# Patient Record
Sex: Female | Born: 1950 | Race: White | Hispanic: No | Marital: Single | Smoking: Never smoker
Health system: Southern US, Community
[De-identification: ages and names within clinical notes are randomized; demographics above are authoritative.]

## PROBLEM LIST (undated history)

## (undated) DIAGNOSIS — C55 Malignant neoplasm of uterus, part unspecified: Secondary | ICD-10-CM

## (undated) DIAGNOSIS — N289 Disorder of kidney and ureter, unspecified: Secondary | ICD-10-CM

## (undated) DIAGNOSIS — M1990 Unspecified osteoarthritis, unspecified site: Secondary | ICD-10-CM

## (undated) DIAGNOSIS — N183 Chronic kidney disease, stage 3 unspecified: Secondary | ICD-10-CM

## (undated) DIAGNOSIS — N200 Calculus of kidney: Secondary | ICD-10-CM

## (undated) DIAGNOSIS — I219 Acute myocardial infarction, unspecified: Secondary | ICD-10-CM

## (undated) DIAGNOSIS — G92 Toxic encephalopathy: Secondary | ICD-10-CM

## (undated) DIAGNOSIS — G928 Other toxic encephalopathy: Secondary | ICD-10-CM

## (undated) DIAGNOSIS — I10 Essential (primary) hypertension: Secondary | ICD-10-CM

## (undated) DIAGNOSIS — I469 Cardiac arrest, cause unspecified: Secondary | ICD-10-CM

## (undated) DIAGNOSIS — Z87442 Personal history of urinary calculi: Secondary | ICD-10-CM

## (undated) DIAGNOSIS — Z972 Presence of dental prosthetic device (complete) (partial): Secondary | ICD-10-CM

## (undated) DIAGNOSIS — Z9889 Other specified postprocedural states: Secondary | ICD-10-CM

## (undated) DIAGNOSIS — J449 Chronic obstructive pulmonary disease, unspecified: Secondary | ICD-10-CM

## (undated) HISTORY — DX: Essential (primary) hypertension: I10

## (undated) HISTORY — PX: CHOLECYSTECTOMY: SHX55

## (undated) HISTORY — PX: ABDOMINAL HYSTERECTOMY: SHX81

---

## 1998-06-30 ENCOUNTER — Other Ambulatory Visit: Admission: RE | Admit: 1998-06-30 | Discharge: 1998-06-30 | Payer: Self-pay | Admitting: *Deleted

## 1999-02-13 ENCOUNTER — Encounter: Payer: Self-pay | Admitting: General Surgery

## 1999-02-16 ENCOUNTER — Observation Stay (HOSPITAL_COMMUNITY): Admission: RE | Admit: 1999-02-16 | Discharge: 1999-02-18 | Payer: Self-pay | Admitting: General Surgery

## 1999-02-16 ENCOUNTER — Encounter: Payer: Self-pay | Admitting: General Surgery

## 2000-01-02 ENCOUNTER — Other Ambulatory Visit: Admission: RE | Admit: 2000-01-02 | Discharge: 2000-01-02 | Payer: Self-pay | Admitting: *Deleted

## 2000-01-11 ENCOUNTER — Encounter: Admission: RE | Admit: 2000-01-11 | Discharge: 2000-01-11 | Payer: Self-pay | Admitting: *Deleted

## 2000-01-11 ENCOUNTER — Encounter: Payer: Self-pay | Admitting: *Deleted

## 2011-10-26 ENCOUNTER — Encounter: Payer: Self-pay | Admitting: Emergency Medicine

## 2011-10-26 ENCOUNTER — Emergency Department (INDEPENDENT_AMBULATORY_CARE_PROVIDER_SITE_OTHER)
Admission: EM | Admit: 2011-10-26 | Discharge: 2011-10-26 | Disposition: A | Discharge status: Home / Self Care | Payer: Self-pay | Source: Home / Self Care | Attending: Family Medicine | Admitting: Family Medicine

## 2011-10-26 DIAGNOSIS — J45909 Unspecified asthma, uncomplicated: Secondary | ICD-10-CM

## 2011-10-26 DIAGNOSIS — J45901 Unspecified asthma with (acute) exacerbation: Secondary | ICD-10-CM

## 2011-10-26 MED ORDER — PREDNISONE 20 MG PO TABS: 60.0000 mg | ORAL_TABLET | Freq: Every day | ORAL | Status: AC

## 2011-10-26 MED ORDER — ALBUTEROL SULFATE (5 MG/ML) 0.5% IN NEBU
INHALATION_SOLUTION | RESPIRATORY_TRACT | Status: AC
  Filled 2011-10-26: qty 1

## 2011-10-26 MED ORDER — IPRATROPIUM BROMIDE 0.02 % IN SOLN
0.5000 mg | RESPIRATORY_TRACT | Status: DC
  Administered 2011-10-26 (×2): 0.5 mg via RESPIRATORY_TRACT

## 2011-10-26 MED ORDER — ALBUTEROL SULFATE (5 MG/ML) 0.5% IN NEBU
2.5000 mg | INHALATION_SOLUTION | Freq: Once | RESPIRATORY_TRACT | Status: AC
  Administered 2011-10-26: 2.5 mg via RESPIRATORY_TRACT

## 2011-10-26 MED ORDER — PREDNISONE 10 MG PO TABS
ORAL_TABLET | ORAL | Status: AC
  Filled 2011-10-26: qty 3

## 2011-10-26 MED ORDER — PREDNISONE 20 MG PO TABS
60.0000 mg | ORAL_TABLET | Freq: Once | ORAL | Status: AC
  Administered 2011-10-26: 60 mg via ORAL

## 2011-10-26 MED ORDER — ALBUTEROL SULFATE HFA 108 (90 BASE) MCG/ACT IN AERS: 2.0000 | INHALATION_SPRAY | RESPIRATORY_TRACT | Status: DC | PRN

## 2011-10-26 MED ORDER — ALBUTEROL SULFATE (5 MG/ML) 0.5% IN NEBU
5.0000 mg | INHALATION_SOLUTION | Freq: Once | RESPIRATORY_TRACT | Status: AC
  Administered 2011-10-26: 5 mg via RESPIRATORY_TRACT

## 2011-10-26 MED ORDER — IPRATROPIUM BROMIDE 0.02 % IN SOLN
0.5000 mg | Freq: Once | RESPIRATORY_TRACT | Status: AC
  Administered 2011-10-26: 0.5 mg via RESPIRATORY_TRACT

## 2011-10-26 NOTE — ED Notes (Signed)
PT HERE WITH ASTHMA ATTACK THAT STARTED OFF LAST WEEK WITH COUGH AND YELOW PHLEGM THEN PROGRESSED TO SOB,WHEEZING.PT USED INHALER BUT NOT WORKING.SATS 90% R/A ONCE BROUGHT BACK URGENT FROM WAITING ROOM THEN INCREASED TO 94-96% ONCE SETTLED.RESP 28,H/R 128.BREATHING TREATMENT STARTED

## 2011-10-26 NOTE — ED Provider Notes (Signed)
History     CSN: [id number]  Arrival date & time 10/26/11  1722   First MD Initiated Contact with Patient 10/26/11 1733      Chief Complaint  Patient presents with  . Asthma    (Consider location/radiation/quality/duration/timing/severity/associated sxs/prior treatment) HPI Comments: Emily Harrington presents for evaluation of persistent cough, shortness of breath, wheezing, over the last week. She and her daughter report a hx of asthma, normally without problem. She did keep her grandson last week, who had an upper respiratory infection. Her daughter reports that she has not had regular medical care since losing her insurance several years ago.   Patient is a 61 y.o. female presenting with asthma. The history is provided by the patient.  Asthma This is a recurrent problem. The current episode started more than 1 week ago. The problem occurs constantly. The problem has been gradually worsening. Associated symptoms include shortness of breath. The symptoms are aggravated by walking and exertion. The symptoms are relieved by nothing.    Past Medical History  Diagnosis Date  . Asthma     Past Surgical History  Procedure Date  . Cholecystectomy   . Abdominal hysterectomy     No family history on file.  History  Substance Use Topics  . Smoking status: Never Smoker   . Smokeless tobacco: Not on file  . Alcohol Use: No    OB History    Grav Para Term Preterm Abortions TAB SAB Ect Mult Living                  Review of Systems  Constitutional: Negative.   HENT: Negative.   Eyes: Negative.   Respiratory: Positive for cough, chest tightness, shortness of breath and wheezing.   Cardiovascular: Negative.   Gastrointestinal: Negative.   Genitourinary: Negative.   Musculoskeletal: Negative.   Skin: Negative.   Neurological: Negative.     Allergies  Review of patient's allergies indicates no known allergies.  Home Medications   Current Outpatient Rx  Name Route Sig Dispense  Refill  . ASPIRIN 81 MG PO TABS Oral Take 160 mg by mouth daily.      . MULTIVITAMINS PO CAPS Oral Take 1 capsule by mouth daily.      . ALBUTEROL SULFATE HFA 108 (90 BASE) MCG/ACT IN AERS Inhalation Inhale 2 puffs into the lungs every 4 (four) hours as needed for wheezing or shortness of breath. 1 Inhaler 0  . PREDNISONE 20 MG PO TABS Oral Take 3 tablets (60 mg total) by mouth daily. 21 tablet 0    BP 129/81  Pulse 125  Temp(Src) 98.5 F (36.9 C) (Oral)  Resp 28  SpO2 90%  Physical Exam  Nursing note and vitals reviewed. Constitutional: She is oriented to person, place, and time. She appears well-developed and well-nourished.  HENT:  Head: Normocephalic and atraumatic.  Eyes: EOM are normal.  Neck: Normal range of motion.  Cardiovascular: Regular rhythm.  Tachycardia present.   No murmur heard. Pulmonary/Chest: Effort normal. She has wheezes in the right upper field, the right middle field, the right lower field, the left upper field, the left middle field and the left lower field.  Musculoskeletal: Normal range of motion.  Neurological: She is alert and oriented to person, place, and time.  Skin: Skin is warm and dry.  Psychiatric: Her behavior is normal.    ED Course  Procedures (including critical care time)  Labs Reviewed - No data to display No results found.   1. Asthma attack  MDM  rx given for steroid burst, refilled albuterol; return precautions given        Richardo Priest, MD 10/26/11 1905

## 2012-01-08 ENCOUNTER — Emergency Department (HOSPITAL_COMMUNITY): Payer: Medicaid Other

## 2012-01-08 ENCOUNTER — Inpatient Hospital Stay (HOSPITAL_COMMUNITY): Payer: Medicaid Other

## 2012-01-08 ENCOUNTER — Inpatient Hospital Stay (HOSPITAL_COMMUNITY)
Admission: EM | Admit: 2012-01-08 | Discharge: 2012-02-14 | DRG: 004 | Disposition: A | Discharge status: Skilled Nursing Facility | Payer: Medicaid Other | Source: Ambulatory Visit | Attending: Pulmonary Disease | Admitting: Pulmonary Disease

## 2012-01-08 DIAGNOSIS — Z886 Allergy status to analgesic agent status: Secondary | ICD-10-CM

## 2012-01-08 DIAGNOSIS — E876 Hypokalemia: Secondary | ICD-10-CM | POA: Diagnosis not present

## 2012-01-08 DIAGNOSIS — Z930 Tracheostomy status: Secondary | ICD-10-CM

## 2012-01-08 DIAGNOSIS — M87059 Idiopathic aseptic necrosis of unspecified femur: Secondary | ICD-10-CM | POA: Diagnosis present

## 2012-01-08 DIAGNOSIS — R509 Fever, unspecified: Secondary | ICD-10-CM | POA: Diagnosis not present

## 2012-01-08 DIAGNOSIS — R0902 Hypoxemia: Secondary | ICD-10-CM | POA: Diagnosis present

## 2012-01-08 DIAGNOSIS — Z79899 Other long term (current) drug therapy: Secondary | ICD-10-CM

## 2012-01-08 DIAGNOSIS — S2249XA Multiple fractures of ribs, unspecified side, initial encounter for closed fracture: Secondary | ICD-10-CM

## 2012-01-08 DIAGNOSIS — R7881 Bacteremia: Secondary | ICD-10-CM

## 2012-01-08 DIAGNOSIS — Y849 Medical procedure, unspecified as the cause of abnormal reaction of the patient, or of later complication, without mention of misadventure at the time of the procedure: Secondary | ICD-10-CM | POA: Diagnosis present

## 2012-01-08 DIAGNOSIS — J9601 Acute respiratory failure with hypoxia: Secondary | ICD-10-CM

## 2012-01-08 DIAGNOSIS — Z7982 Long term (current) use of aspirin: Secondary | ICD-10-CM

## 2012-01-08 DIAGNOSIS — Z23 Encounter for immunization: Secondary | ICD-10-CM

## 2012-01-08 DIAGNOSIS — J155 Pneumonia due to Escherichia coli: Secondary | ICD-10-CM

## 2012-01-08 DIAGNOSIS — M87052 Idiopathic aseptic necrosis of left femur: Secondary | ICD-10-CM

## 2012-01-08 DIAGNOSIS — Y9210 Unspecified residential institution as the place of occurrence of the external cause: Secondary | ICD-10-CM | POA: Diagnosis present

## 2012-01-08 DIAGNOSIS — IMO0002 Reserved for concepts with insufficient information to code with codable children: Secondary | ICD-10-CM | POA: Diagnosis not present

## 2012-01-08 DIAGNOSIS — J45901 Unspecified asthma with (acute) exacerbation: Secondary | ICD-10-CM

## 2012-01-08 DIAGNOSIS — D638 Anemia in other chronic diseases classified elsewhere: Secondary | ICD-10-CM | POA: Diagnosis present

## 2012-01-08 DIAGNOSIS — Z87891 Personal history of nicotine dependence: Secondary | ICD-10-CM

## 2012-01-08 DIAGNOSIS — R000 Tachycardia, unspecified: Secondary | ICD-10-CM | POA: Diagnosis not present

## 2012-01-08 DIAGNOSIS — J9600 Acute respiratory failure, unspecified whether with hypoxia or hypercapnia: Secondary | ICD-10-CM

## 2012-01-08 DIAGNOSIS — I10 Essential (primary) hypertension: Secondary | ICD-10-CM

## 2012-01-08 DIAGNOSIS — A403 Sepsis due to Streptococcus pneumoniae: Principal | ICD-10-CM | POA: Diagnosis present

## 2012-01-08 DIAGNOSIS — A419 Sepsis, unspecified organism: Secondary | ICD-10-CM

## 2012-01-08 DIAGNOSIS — R1312 Dysphagia, oropharyngeal phase: Secondary | ICD-10-CM | POA: Diagnosis not present

## 2012-01-08 DIAGNOSIS — S2220XA Unspecified fracture of sternum, initial encounter for closed fracture: Secondary | ICD-10-CM

## 2012-01-08 DIAGNOSIS — J398 Other specified diseases of upper respiratory tract: Secondary | ICD-10-CM

## 2012-01-08 DIAGNOSIS — N170 Acute kidney failure with tubular necrosis: Secondary | ICD-10-CM

## 2012-01-08 DIAGNOSIS — B953 Streptococcus pneumoniae as the cause of diseases classified elsewhere: Secondary | ICD-10-CM

## 2012-01-08 DIAGNOSIS — J13 Pneumonia due to Streptococcus pneumoniae: Secondary | ICD-10-CM | POA: Diagnosis present

## 2012-01-08 DIAGNOSIS — G931 Anoxic brain damage, not elsewhere classified: Secondary | ICD-10-CM | POA: Diagnosis not present

## 2012-01-08 DIAGNOSIS — R7309 Other abnormal glucose: Secondary | ICD-10-CM | POA: Diagnosis not present

## 2012-01-08 DIAGNOSIS — I96 Gangrene, not elsewhere classified: Secondary | ICD-10-CM

## 2012-01-08 DIAGNOSIS — R6521 Severe sepsis with septic shock: Secondary | ICD-10-CM

## 2012-01-08 DIAGNOSIS — G9340 Encephalopathy, unspecified: Secondary | ICD-10-CM | POA: Diagnosis not present

## 2012-01-08 DIAGNOSIS — H6590 Unspecified nonsuppurative otitis media, unspecified ear: Secondary | ICD-10-CM | POA: Diagnosis not present

## 2012-01-08 DIAGNOSIS — B372 Candidiasis of skin and nail: Secondary | ICD-10-CM | POA: Diagnosis not present

## 2012-01-08 DIAGNOSIS — E46 Unspecified protein-calorie malnutrition: Secondary | ICD-10-CM | POA: Diagnosis not present

## 2012-01-08 DIAGNOSIS — I469 Cardiac arrest, cause unspecified: Secondary | ICD-10-CM

## 2012-01-08 DIAGNOSIS — J189 Pneumonia, unspecified organism: Secondary | ICD-10-CM

## 2012-01-08 DIAGNOSIS — R0781 Pleurodynia: Secondary | ICD-10-CM

## 2012-01-08 DIAGNOSIS — J9620 Acute and chronic respiratory failure, unspecified whether with hypoxia or hypercapnia: Secondary | ICD-10-CM

## 2012-01-08 DIAGNOSIS — E872 Acidosis: Secondary | ICD-10-CM

## 2012-01-08 LAB — POCT I-STAT 3, ART BLOOD GAS (G3+)
Acid-base deficit: 14 mmol/L — ABNORMAL HIGH (ref 0.0–2.0)
Bicarbonate: 13.9 mEq/L — ABNORMAL LOW (ref 20.0–24.0)
Bicarbonate: 17.1 mEq/L — ABNORMAL LOW (ref 20.0–24.0)
O2 Saturation: 100 %
Patient temperature: 99.5
TCO2: 18 mmol/L (ref 0–100)
pH, Arterial: 7.262 — ABNORMAL LOW (ref 7.350–7.400)
pO2, Arterial: 380 mmHg — ABNORMAL HIGH (ref 80.0–100.0)
pO2, Arterial: 293 mmHg — ABNORMAL HIGH (ref 80.0–100.0)

## 2012-01-08 LAB — TYPE AND SCREEN: Antibody Screen: NEGATIVE

## 2012-01-08 LAB — PROCALCITONIN: Procalcitonin: >175 ng/mL

## 2012-01-08 LAB — COMPREHENSIVE METABOLIC PANEL
ALT: 120 U/L — ABNORMAL HIGH (ref 0–35)
AST: 261 U/L — ABNORMAL HIGH (ref 0–37)
Albumin: 2.1 g/dL — ABNORMAL LOW (ref 3.5–5.2)
Alkaline Phosphatase: 135 U/L — ABNORMAL HIGH (ref 39–117)
Potassium: 4.1 mEq/L (ref 3.5–5.1)
Sodium: 135 mEq/L (ref 135–145)
Total Protein: 4.9 g/dL — ABNORMAL LOW (ref 6.0–8.3)

## 2012-01-08 LAB — CBC
MCH: 28.9 pg (ref 26.0–34.0)
MCV: 90.5 fL (ref 78.0–100.0)
Platelets: 249 10*3/uL (ref 150–400)
RDW: 15.4 % (ref 11.5–15.5)

## 2012-01-08 LAB — MRSA PCR SCREENING: MRSA by PCR: NEGATIVE

## 2012-01-08 LAB — CARDIAC PANEL(CRET KIN+CKTOT+MB+TROPI)
CK, MB: 42.6 ng/mL (ref 0.3–4.0)
Troponin I: <0.3 ng/mL (ref <0.30)

## 2012-01-08 LAB — LACTIC ACID, PLASMA: Lactic Acid, Venous: 6.2 mmol/L — ABNORMAL HIGH (ref 0.5–2.2)

## 2012-01-08 LAB — STREP PNEUMONIAE URINARY ANTIGEN: Strep Pneumo Urinary Antigen: POSITIVE — AB

## 2012-01-08 LAB — ABO/RH: ABO/RH(D): A POS

## 2012-01-08 LAB — CARBOXYHEMOGLOBIN: Total hemoglobin: 10.9 g/dL — ABNORMAL LOW (ref 12.5–16.0)

## 2012-01-08 MED ORDER — PANTOPRAZOLE SODIUM 40 MG IV SOLR
40.0000 mg | Freq: Every day | INTRAVENOUS | Status: DC
  Administered 2012-01-08: 40 mg via INTRAVENOUS
  Filled 2012-01-08 (×2): qty 40

## 2012-01-08 MED ORDER — MIDAZOLAM HCL 2 MG/2ML IJ SOLN
INTRAMUSCULAR | Status: AC
  Administered 2012-01-08: 2 mg via INTRAVENOUS
  Filled 2012-01-08: qty 2

## 2012-01-08 MED ORDER — ACETAMINOPHEN 325 MG PO TABS: 650.0000 mg | ORAL_TABLET | Freq: Four times a day (QID) | ORAL | Status: DC | PRN

## 2012-01-08 MED ORDER — VANCOMYCIN HCL 1000 MG IV SOLR
1500.0000 mg | Freq: Two times a day (BID) | INTRAVENOUS | Status: DC
  Administered 2012-01-08 – 2012-01-09 (×2): 1500 mg via INTRAVENOUS
  Filled 2012-01-08 (×3): qty 1500

## 2012-01-08 MED ORDER — ACETAMINOPHEN 160 MG/5ML PO SOLN: 650.0000 mg | Freq: Four times a day (QID) | ORAL | Status: DC | PRN

## 2012-01-08 MED ORDER — MIDAZOLAM HCL 2 MG/2ML IJ SOLN
2.0000 mg | INTRAMUSCULAR | Status: DC | PRN
  Administered 2012-01-08 (×3): 2 mg via INTRAVENOUS
  Administered 2012-01-10: 4 mg via INTRAVENOUS
  Filled 2012-01-08 (×4): qty 2

## 2012-01-08 MED ORDER — SODIUM CHLORIDE 0.9 % IV BOLUS (SEPSIS)
500.0000 mL | Freq: Once | INTRAVENOUS | Status: DC
  Administered 2012-01-08: 500 mL via INTRAVENOUS

## 2012-01-08 MED ORDER — SODIUM CHLORIDE 0.9 % IV SOLN: 250.0000 mL | INTRAVENOUS | Status: DC | PRN

## 2012-01-08 MED ORDER — SODIUM CHLORIDE 0.9 % IV SOLN
2.0000 mg/h | INTRAVENOUS | Status: DC
  Administered 2012-01-08 – 2012-01-09 (×2): 2 mg/h via INTRAVENOUS
  Filled 2012-01-08 (×2): qty 10

## 2012-01-08 MED ORDER — PIPERACILLIN-TAZOBACTAM 3.375 G IVPB 30 MIN
3.3750 g | Freq: Once | INTRAVENOUS | Status: AC
  Administered 2012-01-08: 3.375 g via INTRAVENOUS

## 2012-01-08 MED ORDER — SODIUM BICARBONATE 8.4 % IV SOLN
INTRAVENOUS | Status: DC
  Administered 2012-01-08 – 2012-01-09 (×3): via INTRAVENOUS
  Filled 2012-01-08 (×8): qty 150

## 2012-01-08 MED ORDER — SODIUM CHLORIDE 0.9 % IV SOLN
50.0000 ug/h | INTRAVENOUS | Status: DC
  Administered 2012-01-08: 50 ug/h via INTRAVENOUS
  Administered 2012-01-09: 125 ug/h via INTRAVENOUS
  Filled 2012-01-08 (×2): qty 50

## 2012-01-08 MED ORDER — EPINEPHRINE HCL 0.1 MG/ML IJ SOLN
INTRAMUSCULAR | Status: AC | PRN
  Administered 2012-01-08: 1 mg via INTRAVENOUS

## 2012-01-08 MED ORDER — HYDROCORTISONE SOD SUCCINATE 100 MG IJ SOLR: 100.0000 mg | Freq: Once | INTRAMUSCULAR | Status: DC

## 2012-01-08 MED ORDER — FENTANYL CITRATE 0.05 MG/ML IJ SOLN
50.0000 ug | Freq: Once | INTRAMUSCULAR | Status: AC
  Administered 2012-01-08: 50 ug via INTRAVENOUS

## 2012-01-08 MED ORDER — FENTANYL CITRATE 0.05 MG/ML IJ SOLN
50.0000 ug | INTRAMUSCULAR | Status: DC | PRN
  Administered 2012-01-08: 50 ug via INTRAVENOUS
  Administered 2012-01-10: 100 ug via INTRAVENOUS
  Filled 2012-01-08 (×2): qty 2

## 2012-01-08 MED ORDER — ACETAMINOPHEN 160 MG/5ML PO SOLN
650.0000 mg | Freq: Four times a day (QID) | ORAL | Status: DC | PRN
  Administered 2012-01-08 – 2012-01-31 (×4): 650 mg
  Filled 2012-01-08 (×5): qty 20.3

## 2012-01-08 MED ORDER — MIDAZOLAM BOLUS VIA INFUSION
1.0000 mg | INTRAVENOUS | Status: DC | PRN
  Filled 2012-01-08: qty 2

## 2012-01-08 MED ORDER — NOREPINEPHRINE BITARTRATE 1 MG/ML IJ SOLN
2.0000 ug/min | INTRAMUSCULAR | Status: DC | PRN
  Administered 2012-01-08: 15 ug/min via INTRAVENOUS
  Filled 2012-01-08: qty 4

## 2012-01-08 MED ORDER — PIPERACILLIN-TAZOBACTAM 3.375 G IVPB
3.3750 g | Freq: Three times a day (TID) | INTRAVENOUS | Status: DC
  Administered 2012-01-08 – 2012-01-11 (×8): 3.375 g via INTRAVENOUS
  Filled 2012-01-08 (×11): qty 50

## 2012-01-08 MED ORDER — SODIUM CHLORIDE 0.9 % IV BOLUS (SEPSIS)
500.0000 mL | INTRAVENOUS | Status: DC | PRN
  Administered 2012-01-09: 500 mL via INTRAVENOUS

## 2012-01-08 MED ORDER — FENTANYL BOLUS VIA INFUSION
50.0000 ug | Freq: Four times a day (QID) | INTRAVENOUS | Status: DC | PRN
  Administered 2012-01-10 – 2012-01-11 (×2): 100 ug via INTRAVENOUS
  Filled 2012-01-08: qty 100

## 2012-01-08 MED ORDER — HYDROCORTISONE SOD SUCCINATE 100 MG IJ SOLR
50.0000 mg | Freq: Four times a day (QID) | INTRAMUSCULAR | Status: DC
  Administered 2012-01-08 – 2012-01-10 (×6): 50 mg via INTRAVENOUS
  Filled 2012-01-08 (×11): qty 1

## 2012-01-08 MED ORDER — ATROPINE SULFATE 1 MG/ML IJ SOLN
INTRAMUSCULAR | Status: AC | PRN
  Administered 2012-01-08: 1 mg via INTRAVENOUS

## 2012-01-08 MED ORDER — NOREPINEPHRINE BITARTRATE 1 MG/ML IJ SOLN
2.0000 ug/min | INTRAVENOUS | Status: AC
  Administered 2012-01-08: 25 ug/min via INTRAVENOUS
  Administered 2012-01-08: 5 ug/min via INTRAVENOUS
  Filled 2012-01-08: qty 4

## 2012-01-08 MED ORDER — IOHEXOL 300 MG/ML  SOLN
80.0000 mL | Freq: Once | INTRAMUSCULAR | Status: AC | PRN
  Administered 2012-01-08: 80 mL via INTRAVENOUS

## 2012-01-08 MED ORDER — VASOPRESSIN 20 UNIT/ML IJ SOLN
0.0300 [IU]/min | INTRAVENOUS | Status: DC
  Filled 2012-01-08: qty 2.5

## 2012-01-08 MED ORDER — NOREPINEPHRINE BITARTRATE 1 MG/ML IJ SOLN
2.0000 ug/min | INTRAVENOUS | Status: DC
  Administered 2012-01-08: 30 ug/min via INTRAVENOUS
  Administered 2012-01-09: 4 ug/min via INTRAVENOUS
  Administered 2012-01-09: 10 ug/min via INTRAVENOUS
  Administered 2012-01-09: 5 ug/min via INTRAVENOUS
  Filled 2012-01-08 (×3): qty 8

## 2012-01-08 MED ORDER — MIDAZOLAM HCL 2 MG/2ML IJ SOLN
2.0000 mg | Freq: Once | INTRAMUSCULAR | Status: AC
  Administered 2012-01-08: 2 mg via INTRAVENOUS

## 2012-01-08 MED ORDER — FENTANYL CITRATE 0.05 MG/ML IJ SOLN
INTRAMUSCULAR | Status: AC
  Administered 2012-01-08: 50 ug via INTRAVENOUS
  Filled 2012-01-08: qty 2

## 2012-01-08 MED ORDER — EPINEPHRINE HCL 1 MG/ML IJ SOLN
INTRAMUSCULAR | Status: AC
  Filled 2012-01-08: qty 1

## 2012-01-08 MED ORDER — DOBUTAMINE IN D5W 4-5 MG/ML-% IV SOLN: 2.5000 ug/kg/min | INTRAVENOUS | Status: DC | PRN

## 2012-01-08 MED ORDER — SODIUM CHLORIDE 0.9 % IV SOLN
INTRAVENOUS | Status: DC
  Administered 2012-01-08: 17:00:00 via INTRAVENOUS

## 2012-01-08 MED ORDER — HYDROCORTISONE SOD SUCCINATE 100 MG PF FOR IT USE: 100.0000 mg | Freq: Once | INTRAMUSCULAR | Status: DC

## 2012-01-08 NOTE — H&P (Signed)
Name: BASSY FETTERLY MRN: [id number] DOB: 1951-09-28    LOS: 0  PCCM ADMISSION NOTE  History of Present Illness: 61 year old female with unknown PMH who crawled from her room and lost consciousness.  EMS was called and patient was brought to the ED.  Enroute the patient decompensated and became combative and unable to protect airway.  Patient also dropped her pressure and was intubated enroute to the ED.  Lines / Drains: L [place] TLC 3/20>>> R femoral a-line 3/20>>> ET tube 3/20>>>  Cultures: Blood 3/20>>> Sputum 3/20>>> Urine 3/20>>>  Antibiotics: Vancomycin 3/20>>> Zosyn 3/20>>>  Tests / Events: Cardiac arrest in the ED 3/20>>>  The patient is sedated, intubated and unable to provide history, which was obtained for available medical records.    Past Medical History  Diagnosis Date  . Asthma    Past Surgical History  Procedure Date  . Cholecystectomy   . Abdominal hysterectomy    Prior to Admission medications   Medication Sig Start Date End Date Taking? Authorizing Provider  albuterol (PROVENTIL HFA;VENTOLIN HFA) 108 (90 BASE) MCG/ACT inhaler Inhale 2 puffs into the lungs every 4 (four) hours as needed. For wheezing or shortness of breath. 10/26/11 10/25/12 Yes Renaee Munda, MD  aspirin EC 81 MG tablet Take 162 mg by mouth daily.   Yes Historical Provider, MD  Multiple Vitamin (MULTIVITAMIN) capsule Take 1 capsule by mouth daily.     Yes Historical Provider, MD    Allergies Allergies  Allergen Reactions  . Nsaids     Family History Unknown  Social History Unknown  Review Of Systems  11 points review of systems is negative with an exception of listed in HPI.  Vital Signs: Filed Vitals:   01/08/12 1617  BP: 69/22  Resp:    No intake or output data in the 24 hours ending 01/08/12 1619  Ventilator settings: Vent Mode:  [-] PRVC FiO2 (%):  [100 %] 100 % Set Rate:  [16 bmp] 16 bmp Vt Set:  [530 mL] 530 mL PEEP:  [5 cmH20] 5 cmH20  Physical  Examination: General:  Chronically ill appearing, unkempt covered in feces. Neuro:  Was purposeful and looking around prior to code.   HEENT:  [place]/AT, PERRL, EOM-I and MMM. Neck:  Supple, -LAN, -thryomegally and -LAN Cardiovascular:  RRR, Nl S1/S2, -M/R/G. Lungs:  Diffuse crackles Abdomen:  Soft, NT, ND and +BS. Musculoskeletal:  -edema and -tenderness. Skin:  Multiple scars.  Labs and Imaging:   Labs: CBC No results found for this basename: wbc, rbc, hgb, hct, plt, mcv, mch, mchc, rdw, neutrabs, lymphsabs, monoabs, eosabs, basosabs    BMET No results found for this basename: na, k, cl, co2, glucose, bun, creatinine, calcium, gfrnonaa, gfraa   ABG No results found for this basename: phart, pco2, pco2art, po2, po2art, hco3, tco2, acidbasedef, o2sat    No results found for this basename: MG in the last 168 hours No results found for this basename: CALCIUM, PHOS    Assessment and Plan: 25 yead old female with PMH of asthma who presents to the hospital after being found SOB and unresponsive.  Patient likely has a RUL PNA vs mass.  Patient dropped her pressure in the ED and had a cardiac arrest episode.  Original rhythm was PEA, after CXR, infiltrate were noted, ?of [place], Echo ordered emergently and RV was normal and LV was active.  This is likely a PNA with septic shock.  Neuro: Anoxic encephalopathy is a possibility here, no labs  are present. Plan: - Head CT.  - Intermittens sedation, patient localizing.  CV: Shock, likely septic with a RUL PNA. Plan: - Check CVP.  - Will likely need septic shock protocol if CVP is low.  - Vanc/zosyn.  - Norepi/vasopressin  - Stress dose steroids.  Pulmonary: VDRF with RUL PNA in a patient with history of sepsis. Plan: - Full vent support.  - F/U ABG.  - Abx as above.  Renal: unknown function for now, will need BMET.  GI: No active issues, TF in AM.  ID: RUL PNA, vanc/zosyn and pan culture.  Endocrine: Unknown if diabetic,  will need labs first to determine, no family bedside.   The patient is critically ill with multiple organ systems failure and requires high complexity decision making for assessment and support, frequent evaluation and titration of therapies, application of advanced monitoring technologies and extensive interpretation of multiple databases. Critical Care Time devoted to patient care services described in this note is 90 minutes.  Koren Bound, M.D. Pulmonary and Critical Care Medicine [facility] [phone]  01/08/2012, 4:19 PM

## 2012-01-08 NOTE — ED Provider Notes (Addendum)
Patient seen on arrival. Per EMS her daughter called police when her mother did not answer the phone. When he entered the house they found the patient lying in the living room by the door and appeared she crawled down the hall. He states she has been incontinent of stool. They relate patient was combative and had a CBG of 45. She's given one amp of D50. They relate they were unable to palpate a blood pressure and she was given normal saline 500 cc he reports patient's color was good however they did do intubation for agonal type respirations. She presents with ET tube  Patients color is good. She is having some respiratory effort. Lungs have equal BS without sound in the stomach on ventilation. Abdomen appears distended. Pt has minor scrape on left knee without effusion. She has stool on her legs.     I saw and evaluated the patient, reviewed the resident's note and I agree with the findings and plan.  Devoria Albe, MD, FACEP   Ward Givens, MD 01/08/12 1558  Ward Givens, MD 01/11/12 0002

## 2012-01-08 NOTE — ED Notes (Signed)
CBG CHECKED BY EMT R Taji Barretto 

## 2012-01-08 NOTE — Progress Notes (Signed)
  Echocardiogram 2D Echocardiogram has been performed.  Emily Harrington [facility] 01/08/2012, 5:08 PM

## 2012-01-08 NOTE — ED Notes (Signed)
2911-01 Ready

## 2012-01-08 NOTE — ED Provider Notes (Signed)
See prior note   Ward Givens, MD 01/08/12 2337

## 2012-01-08 NOTE — Code Documentation (Signed)
Pulse return.

## 2012-01-08 NOTE — ED Notes (Signed)
[id number] Ready

## 2012-01-08 NOTE — ED Notes (Signed)
Normal saline wide open for low bp, levophed ordered. MD at bedside to put central line in .

## 2012-01-08 NOTE — ED Notes (Signed)
Floor unable to take report at this time. States will call me back at [phone]

## 2012-01-08 NOTE — ED Provider Notes (Signed)
History     CSN: [id number]  Arrival date & time 01/08/12  1543   First MD Initiated Contact with Patient 01/08/12 [phone]      Chief Complaint  Patient presents with  . Altered Mental Status    (Consider location/radiation/quality/duration/timing/severity/associated sxs/prior treatment) HPI Pt presents via ems, called to home for ams, pt was seen yesterday nl, today found lying on her living room floor covered in feces.  O/a of ems gcs 13, pt a&ox3, gcs declined en route and was bagged then intubated for resp failure and combativeness.  According to ems no c/f trauma or OD from family members.   Past Medical History  Diagnosis Date  . Asthma     Past Surgical History  Procedure Date  . Cholecystectomy   . Abdominal hysterectomy     No family history on file.  History  Substance Use Topics  . Smoking status: Never Smoker   . Smokeless tobacco: Not on file  . Alcohol Use: No    OB History    Grav Para Term Preterm Abortions TAB SAB Ect Mult Living                  Review of Systems  Unable to perform ROS: Intubated    Allergies  Nsaids  Home Medications   Current Outpatient Rx  Name Route Sig Dispense Refill  . ALBUTEROL SULFATE HFA 108 (90 BASE) MCG/ACT IN AERS Inhalation Inhale 2 puffs into the lungs every 4 (four) hours as needed. For wheezing or shortness of breath.    . ASPIRIN EC 81 MG PO TBEC Oral Take 162 mg by mouth daily.    . MULTIVITAMINS PO CAPS Oral Take 1 capsule by mouth daily.        BP 72/36  Pulse 105  Resp 15  Ht 5\' 9"  (1.753 m)  SpO2 94%  Physical Exam  Nursing note and vitals reviewed. Constitutional: She appears well-developed and well-nourished.  HENT:  Head: Normocephalic and atraumatic.  Eyes: Pupils are equal, round, and reactive to light. Right eye exhibits no discharge. Left eye exhibits no discharge.  Neck: Neck supple. No JVD present.  Cardiovascular: Normal rate and intact distal pulses.   Pulmonary/Chest: Breath  sounds normal.  Abdominal: Soft. There is no tenderness.  Musculoskeletal: She exhibits no edema and no tenderness.  Neurological: GCS eye subscore is 3. GCS verbal subscore is 1. GCS motor subscore is 5.  Skin: Skin is dry and intact. There is pallor.    ED Course  Procedures (including critical care time)  Labs Reviewed  GLUCOSE, CAPILLARY - Abnormal; Notable for the following:    Glucose-Capillary 174 (*)    All other components within normal limits  POCT I-STAT 3, BLOOD GAS (G3+) - Abnormal; Notable for the following:    pH, Arterial 7.152 (*)    pO2, Arterial 380.0 (*)    Bicarbonate 13.9 (*)    Acid-base deficit 14.0 (*)    All other components within normal limits  COMPREHENSIVE METABOLIC PANEL  MAGNESIUM  PHOSPHORUS  CARDIAC PANEL(CRET KIN+CKTOT+MB+TROPI)  CARDIAC PANEL(CRET KIN+CKTOT+MB+TROPI)  LACTIC ACID, PLASMA  PROCALCITONIN  PRO B NATRIURETIC PEPTIDE  CORTISOL  CBC  PROTIME-INR  TYPE AND SCREEN  CULTURE, BLOOD (ROUTINE X 2)  CULTURE, BLOOD (ROUTINE X 2)  URINE CULTURE  CULTURE, RESPIRATORY  STREP PNEUMONIAE URINARY ANTIGEN  LEGIONELLA ANTIGEN, URINE  BLOOD GAS, ARTERIAL   Dg Chest Portable 1 View  01/08/2012  *RADIOLOGY REPORT*  Clinical Data: Altered mental status,  central line placement  PORTABLE CHEST - 1 VIEW  Comparison: None.  Findings: The tip of the endotracheal tube is approximately 3.9 cm above the carina.  A left subclavian catheter has been placed with tip in the lower SVC.  No pneumothorax is seen.  There is a large mass within the right upper lung field abutting the pleura most consistent with primary lung carcinoma.  This mass measures 6.4 x 11.1 cm.  The left lung is clear.  IMPRESSION:  1.  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 2.  Left subclavian catheter tip in lower SVC.  No pneumothorax. 3.  Endotracheal tube tip approximately 3.9 cm above the carina.  Original Report Authenticated By: Juline Patch, M.D.     No diagnosis found.    MDM  O/a to ED intubated with 7.0ett at 24 at lip, GCS 9T.  bilat bs, good color change on co2 detector.  Blood pressure low with systolics in 60's, manual bp unable to hear pulse but pt has good pedal pulses.  Pt had iv left hand, placed and second and started 2l ivf's wide open, pressure bags placed.  At this point critical care team was already in the ED and care transferred.  While I was still in the room, lost pulses, crit care team ran code, I left room at that point.        Elijio Miles, MD 01/08/12 [phone]

## 2012-01-08 NOTE — ED Notes (Signed)
Per ems- pt coming from home where she was last seen on Monday but daughter talked to pt last night.   Ems was called by daughter today as she could not get ahold of pt.  ems arrived and found pt on floor incontinent of feces and urine. Pt was alert to herself, place and time. Pt could not recall event, stated she fell and that was it.

## 2012-01-08 NOTE — Procedures (Signed)
Arterial Catheter Insertion Procedure Note Emily Harrington [id number] [birth year]  Procedure: Insertion of Arterial Catheter  Indications: Frequent blood sampling  Procedure Details Consent: Unable to obtain consent because of emergent medical necessity. Time Out: Verified patient identification, verified procedure, site/side was marked, verified correct patient position, special equipment/implants available, medications/allergies/relevent history reviewed, required imaging and test results available.  Performed  Maximum sterile technique was used including antiseptics, cap, gloves, gown, hand hygiene, mask and sheet. Skin prep: Chlorhexidine; local anesthetic administered 20 gauge catheter was inserted into right femoral artery using the Seldinger technique.  Evaluation Blood flow good; BP tracing good. Complications: No apparent complications.  Done emergently without consent due to CPR, U/S used in placement.  Koren Bound 01/08/2012

## 2012-01-08 NOTE — Progress Notes (Signed)
eLink Physician-Brief Progress Note Patient Name: Emily Harrington DOB: [birth year] MRN: [id number]  Date of Service  01/08/2012   HPI/Events of Note   Septic shock protocol not ordered.  eICU Interventions  I ordered official septic shock protocol   Intervention Category Major Interventions: Sepsis - evaluation and management;Shock - evaluation and management  Shan Levans 01/08/2012, 7:45 PM

## 2012-01-08 NOTE — ED Notes (Signed)
Pt began agonal breathing and got cyanotic in her face, pt was intubated by ems en route.

## 2012-01-08 NOTE — Progress Notes (Signed)
ANTIBIOTIC CONSULT NOTE - INITIAL  Pharmacy Consult for Vancomycin/zosyn Indication: rule out pneumonia   Allergies  Allergen Reactions  . Nsaids     Patient Measurements: Height: 5\' 9"  (175.3 cm) IBW/kg (Calculated) : 66.2   Vital Signs: BP: 98/52 mmHg (03/20 1648) Pulse Rate: 105  (03/20 1630) Intake/Output from previous day:   Intake/Output from this shift:    Labs: No results found for this basename: WBC:3,HGB:3,PLT:3,LABCREA:3,CREATININE:3 in the last 72 hours CrCl is unknown because no creatinine reading has been taken. No results found for this basename: VANCOTROUGH:2,VANCOPEAK:2,VANCORANDOM:2,GENTTROUGH:2,GENTPEAK:2,GENTRANDOM:2,TOBRATROUGH:2,TOBRAPEAK:2,TOBRARND:2,AMIKACINPEAK:2,AMIKACINTROU:2,AMIKACIN:2, in the last 72 hours   Microbiology: No results found for this or any previous visit (from the past 720 hour(s)).  Medical History: Past Medical History  Diagnosis Date  . Asthma     Medications:   (Not in a hospital admission) Assessment: 61 y/o female patient admitted after being found unresponsive at home, patient status then declined in ED requiring intubation and CPR. Will begin broad spectrum antibiotics for r/o PNA. Estimate weight to be 100kg.  Goal of Therapy:  Vancomycin trough level 15-20 mcg/ml  Plan:  Vancomycin 1500mg  IV q12h, zosyn 3.375g IV q8h and f/u baseline labs with cultures. Will monitor renal fxn.  Verlene Mayer, PharmD, BCPS Pager [phone] 01/08/2012,4:51 PM

## 2012-01-08 NOTE — ED Notes (Signed)
Pt arrived by ems intubated.  Vent settings now PRVC,  FIO2: 100%, Peep 5, rate 16, TV: 530

## 2012-01-08 NOTE — ED Notes (Signed)
Pt transported to ct with respiratory, and two rns on monitor, vent, cvp and art line monitor and iv pumps

## 2012-01-08 NOTE — Progress Notes (Signed)
PHARMACY - CRITICAL CARE PROGRESS NOTE  Pharmacy Consult for  Septic Shock Monitoring  60yo female admitted with respiratory distress requiring intubation.  She has been started on broad spectrum IV antibiotics with Vancomycin and Zosyn for possible pneumonia.    Allergies  Allergen Reactions  . Nsaids     Patient Measurements: Height: 5\' 5"  (165.1 cm) Weight: 197 lb 12 oz (89.7 kg) IBW/kg (Calculated) : 57    Vital Signs: Temp: 99.1 F (37.3 C) (03/20 1900) Temp src: Core (Comment) (03/20 1900) BP: 98/59 mmHg (03/20 1807) Pulse Rate: 121  (03/20 1900) Intake/Output from previous day:   Intake/Output from this shift:   Vent settings for last 24 hours: Vent Mode:  [-] PRVC FiO2 (%):  [100 %] 100 % Set Rate:  [16 bmp] 16 bmp Vt Set:  [530 mL] 530 mL PEEP:  [5 cmH20] 5 cmH20  Labs:  Basename 01/08/12 1626  WBC 26.0*  HGB 10.7*  HCT 33.5*  PLT 249  APTT --  INR 1.63*  CREATININE 3.91*  LABCREA --  CREATININE 3.91*  LABCREA --  CREAT24HRUR --  MG 1.7  PHOS 6.6*  ALBUMIN 2.1*  PROT 4.9*  AST 261*  ALT 120*  ALKPHOS 135*  BILITOT 0.4  BILIDIR --  IBILI --   Estimated Creatinine Clearance: 16.7 ml/min (by C-G formula based on Cr of 3.91).   Basename 01/08/12 1556  GLUCAP 174*    Plan:  Will continue current IV antibiotics and follow up cultures and sensitivities.  Nadara Mustard, PharmD., MS Clinical Pharmacist Pager:  [phone] 01/08/2012,7:46 PM

## 2012-01-08 NOTE — Procedures (Signed)
Central Venous Catheter Insertion Procedure Note Emily Harrington [id number] Apr 02, 1951  Procedure: Insertion of Central Venous Catheter Indications: Assessment of intravascular volume and Drug and/or fluid administration  Procedure Details Consent: Unable to obtain consent because of emergent medical necessity. and ventilated /code situation Time Out: Verified patient identification, verified procedure, site/side was marked, verified correct patient position, special equipment/implants available, medications/allergies/relevent history reviewed, required imaging and test results available.  Performed  Maximum sterile technique was used including antiseptics, cap, gloves, gown, hand hygiene and mask. Skin prep: Chlorhexidine; local anesthetic administered A antimicrobial bonded/coated triple lumen catheter was placed in the left subclavian vein using the Seldinger technique.  Evaluation Blood flow good Complications: No apparent complications Patient did tolerate procedure well. Chest X-ray ordered to verify placement.  CXR: pending.  U/S used in placement.  Procedure performed under direct supervision of Dr. Molli Knock and with ultrasound guidance.    Canary Brim, NP-C  Pulmonary & Critical Care Pgr: [phone]

## 2012-01-08 NOTE — Procedures (Signed)
Code Blue Note  Patient bradied and had a cardiac arrest, CPR x5 minutes, 2 rounds of epi and atropine used.  In shock post CPR, levo and vaso started.  Alyson Reedy, M.D. [facility] Pulmonary/Critical Care Medicine. Pager: [phone]. After hours pager: [phone].

## 2012-01-08 NOTE — Code Documentation (Signed)
MD to put arterial femoral line in right femoral.  Procedure to be done by dr. Molli Knock

## 2012-01-09 ENCOUNTER — Inpatient Hospital Stay (HOSPITAL_COMMUNITY): Payer: Medicaid Other

## 2012-01-09 ENCOUNTER — Encounter (HOSPITAL_COMMUNITY): Payer: Self-pay | Admitting: General Practice

## 2012-01-09 DIAGNOSIS — I469 Cardiac arrest, cause unspecified: Secondary | ICD-10-CM

## 2012-01-09 DIAGNOSIS — A419 Sepsis, unspecified organism: Secondary | ICD-10-CM | POA: Diagnosis present

## 2012-01-09 DIAGNOSIS — J9600 Acute respiratory failure, unspecified whether with hypoxia or hypercapnia: Secondary | ICD-10-CM

## 2012-01-09 DIAGNOSIS — J189 Pneumonia, unspecified organism: Secondary | ICD-10-CM | POA: Diagnosis present

## 2012-01-09 DIAGNOSIS — J9620 Acute and chronic respiratory failure, unspecified whether with hypoxia or hypercapnia: Secondary | ICD-10-CM | POA: Diagnosis present

## 2012-01-09 LAB — BASIC METABOLIC PANEL
BUN: 52 mg/dL — ABNORMAL HIGH (ref 6–23)
Calcium: 7.1 mg/dL — ABNORMAL LOW (ref 8.4–10.5)
Creatinine, Ser: 3.38 mg/dL — ABNORMAL HIGH (ref 0.50–1.10)
GFR calc Af Amer: 16 mL/min — ABNORMAL LOW (ref >90)
GFR calc non Af Amer: 14 mL/min — ABNORMAL LOW (ref >90)
Glucose, Bld: 282 mg/dL — ABNORMAL HIGH (ref 70–99)
Potassium: 4.1 mEq/L (ref 3.5–5.1)

## 2012-01-09 LAB — LEGIONELLA ANTIGEN, URINE

## 2012-01-09 LAB — URINE CULTURE: Culture  Setup Time: 201303202238

## 2012-01-09 LAB — BLOOD GAS, ARTERIAL
Bicarbonate: 19.2 mEq/L — ABNORMAL LOW (ref 20.0–24.0)
PEEP: 5 cmH2O
TCO2: 20 mmol/L (ref 0–100)
pCO2 arterial: 25.2 mmHg — ABNORMAL LOW (ref 35.0–45.0)
pH, Arterial: 7.496 — ABNORMAL HIGH (ref 7.350–7.400)
pO2, Arterial: 184 mmHg — ABNORMAL HIGH (ref 80.0–100.0)

## 2012-01-09 LAB — CBC
HCT: 33.4 % — ABNORMAL LOW (ref 36.0–46.0)
MCH: 29.3 pg (ref 26.0–34.0)
MCHC: 34.1 g/dL (ref 30.0–36.0)
MCV: 85.9 fL (ref 78.0–100.0)
RDW: 15.5 % (ref 11.5–15.5)

## 2012-01-09 LAB — CARDIAC PANEL(CRET KIN+CKTOT+MB+TROPI)
CK, MB: 34.3 ng/mL (ref 0.3–4.0)
CK, MB: 9.5 ng/mL (ref 0.3–4.0)
Relative Index: 0.4 (ref 0.0–2.5)
Total CK: 8060 U/L — ABNORMAL HIGH (ref 7–177)
Total CK: 4302 U/L — ABNORMAL HIGH (ref 7–177)

## 2012-01-09 LAB — GLUCOSE, CAPILLARY
Glucose-Capillary: 251 mg/dL — ABNORMAL HIGH (ref 70–99)
Glucose-Capillary: 130 mg/dL — ABNORMAL HIGH (ref 70–99)

## 2012-01-09 LAB — POCT I-STAT 3, ART BLOOD GAS (G3+)
Bicarbonate: 22.3 mEq/L (ref 20.0–24.0)
O2 Saturation: 99 %
TCO2: 23 mmol/L (ref 0–100)
pO2, Arterial: 108 mmHg — ABNORMAL HIGH (ref 80.0–100.0)

## 2012-01-09 LAB — MAGNESIUM: Magnesium: 1.5 mg/dL (ref 1.5–2.5)

## 2012-01-09 MED ORDER — OXEPA PO LIQD
1000.0000 mL | ORAL | Status: DC
  Filled 2012-01-09 (×2): qty 1000

## 2012-01-09 MED ORDER — PNEUMOCOCCAL VAC POLYVALENT 25 MCG/0.5ML IJ INJ
0.5000 mL | INJECTION | INTRAMUSCULAR | Status: AC
  Administered 2012-01-10: 0.5 mL via INTRAMUSCULAR
  Filled 2012-01-09: qty 0.5

## 2012-01-09 MED ORDER — VANCOMYCIN HCL IN DEXTROSE 1-5 GM/200ML-% IV SOLN
1000.0000 mg | INTRAVENOUS | Status: DC
  Administered 2012-01-10: 1000 mg via INTRAVENOUS
  Filled 2012-01-09 (×2): qty 200

## 2012-01-09 MED ORDER — BIOTENE DRY MOUTH MT LIQD
15.0000 mL | Freq: Four times a day (QID) | OROMUCOSAL | Status: DC
  Administered 2012-01-09 – 2012-01-11 (×11): 15 mL via OROMUCOSAL

## 2012-01-09 MED ORDER — PANTOPRAZOLE SODIUM 40 MG PO PACK
40.0000 mg | PACK | Freq: Every day | ORAL | Status: DC
  Administered 2012-01-09 – 2012-01-10 (×2): 40 mg
  Filled 2012-01-09 (×3): qty 20

## 2012-01-09 MED ORDER — VASOPRESSIN 20 UNIT/ML IJ SOLN: 0.0300 [IU]/min | INTRAVENOUS | Status: DC

## 2012-01-09 MED ORDER — LEVOFLOXACIN IN D5W 750 MG/150ML IV SOLN
750.0000 mg | INTRAVENOUS | Status: DC
  Administered 2012-01-09 – 2012-01-11 (×2): 750 mg via INTRAVENOUS
  Filled 2012-01-09 (×2): qty 150

## 2012-01-09 MED ORDER — OXEPA PO LIQD
1000.0000 mL | ORAL | Status: DC
  Administered 2012-01-09 (×2): 1000 mL
  Administered 2012-01-10: 13:00:00
  Administered 2012-01-10: 1000 mL
  Filled 2012-01-09: qty 1000

## 2012-01-09 MED ORDER — CHLORHEXIDINE GLUCONATE 0.12 % MT SOLN
15.0000 mL | Freq: Two times a day (BID) | OROMUCOSAL | Status: DC
  Administered 2012-01-09 – 2012-01-11 (×6): 15 mL via OROMUCOSAL
  Filled 2012-01-09 (×6): qty 15

## 2012-01-09 MED ORDER — PRO-STAT SUGAR FREE PO LIQD
30.0000 mL | Freq: Four times a day (QID) | ORAL | Status: DC
  Administered 2012-01-09 – 2012-01-10 (×7): 30 mL
  Filled 2012-01-09 (×11): qty 30

## 2012-01-09 MED ORDER — INSULIN ASPART 100 UNIT/ML ~~LOC~~ SOLN
0.0000 [IU] | SUBCUTANEOUS | Status: DC
  Administered 2012-01-09: 3 [IU] via SUBCUTANEOUS
  Administered 2012-01-09 (×2): 2 [IU] via SUBCUTANEOUS
  Administered 2012-01-09 (×2): 8 [IU] via SUBCUTANEOUS
  Administered 2012-01-09 – 2012-01-10 (×2): 2 [IU] via SUBCUTANEOUS
  Administered 2012-01-10 (×2): 3 [IU] via SUBCUTANEOUS

## 2012-01-09 MED ORDER — SODIUM CHLORIDE 0.9 % IV SOLN
INTRAVENOUS | Status: DC
  Administered 2012-01-09 – 2012-01-10 (×2): via INTRAVENOUS

## 2012-01-09 NOTE — Progress Notes (Signed)
Inpatient Diabetes Program Recommendations  AACE/ADA: New Consensus Statement on Inpatient Glycemic Control (2009)  Target Ranges:  Prepandial:   less than 140 mg/dL      Peak postprandial:   less than 180 mg/dL (1-2 hours)      Critically ill patients:  140 - 180 mg/dL   Reason for Visit: Results for Emily Harrington, Emily Harrington (MRN [id number]) as of 01/09/2012 16:02  Ref. Range 01/08/2012 15:56 01/09/2012 07:40 01/09/2012 12:25  Glucose-Capillary Latest Range: 70-99 mg/dL 469 (H) 629 (H) 528 (H)    Inpatient Diabetes Program Recommendations Insulin - IV drip/GlucoStabilizer: Please start ICU Hyperglycemia protocol.  Note: Will follow.

## 2012-01-09 NOTE — Progress Notes (Signed)
Name: Emily Harrington MRN: [id number] DOB: [birth year]    LOS: 1  PCCM ADMISSION NOTE  History of Present Illness: 61 year old female with unknown PMH who crawled from her room and lost consciousness.  EMS was called and patient was brought to the ED.  Enroute the patient decompensated and became combative and unable to protect airway.  Patient also dropped her pressure and was intubated enroute to the ED.  Lines / Drains: L [place] TLC 3/20>>> R femoral a-line 3/20>>> ET tube 3/20>>>  Cultures: Blood 3/20>>> Sputum 3/20>>> Urine 3/20>>>  Antibiotics: Vancomycin 3/20>>> Zosyn 3/20>>> levofloxa 3/21>>>  Tests / Events: Cardiac arrest in the ED 3/20>>> 3/20 ct abdo/pelvis- neg acute 3/20 Ct chest -Large area of airspace consolidation involving the right upper<BR>lobe most likely represents bacterial pneumonia. This will need<BR>interval radiographic followup to confirm resolution and to exclude<BR>an underlying neoplastic process which may have a similar<BR>appearance 3/20- EGDT   Vital Signs: Filed Vitals:   01/09/12 0900  BP: 114/30  Pulse: 108  Temp: 101.3 F (38.5 C)  Resp: 30    Intake/Output Summary (Last 24 hours) at 01/09/12 1022 Last data filed at 01/09/12 0900  Gross per 24 hour  Intake 4025.13 ml  Output    805 ml  Net 3220.13 ml    Ventilator settings: Vent Mode:  [-] PRVC FiO2 (%):  [40 %-100 %] 40 % Set Rate:  [16 bmp-30 bmp] 30 bmp Vt Set:  [230 mL-530 mL] 230 mL PEEP:  [5 cmH20] 5 cmH20 Plateau Pressure:  [23 cmH20-31 cmH20] 24 cmH20  Physical Examination: General:  Chronically ill, vented Neuro:  perrl 3 mm, attempts to follow commands? Not squeezing, grimace? HEENT:  jvd wnl Neck:  Supple Cardiovascular:  RRR, Nl S1/S2, -M/R/G. Lungs:  Diffuse crackles rt greater left upper Abdomen:  Soft, NT, ND and +BS. Musculoskeletal:  -edema  Skin:  Multiple scars.  Labs and Imaging:   Labs: CBC    Component Value Date/Time   WBC 21.6*  01/09/2012 0400    BMET    Component Value Date/Time   NA 133* 01/09/2012 0400   ABG    Component Value Date/Time   PHART 7.496* 01/09/2012 0335     Lab 01/09/12 0400  MG 1.5   Lab Results  Component Value Date   CALCIUM 7.1* 01/09/2012    Assessment and Plan: 61 yead old female with PMH of asthma who presents to the hospital after being found SOB and unresponsive.  Patient likely has a RUL PNA vs mass.  Patient dropped her pressure in the ED and had a cardiac arrest episode.  Original rhythm was PEA, after CXR, infiltrate were noted, ?of [place], Echo ordered emergently and RV was normal and LV was active.  This is likely a PNA with septic shock.  Neuro: Anoxic encephalopathy, septic encephalapthy Plan: - Head CT neg acute  - Intermittens sedation, WUA  CV: Shock, likely septic with a RUL PNA. Plan: - remains on levo cvp noted Reduce volume slight Add vaso as remains on less 15 mics levo No role steroids, see cortisol  Pulmonary: VDRF with RUL PNA in a patient with history of sepsis. Plan: - Full vent support.  - abg reviewed, reduce rate  Will need repeat CT in future , mass underlying  May be able to sbt, no role extubation  BDers  pcxr in am   Renal: unknown function for now No hydro on CT Hope for ATn resolution Dc bicarb  GI: start TF,  ppi, no stools noted  ID: RUL PNA, vanc/zosyn and pan culture.  Add leg, if remain on pressors and culture neg would bronch and cytology Add leg ag Pct noted  Endocrine: ssi Cortisol adaquate   The patient is critically ill with multiple organ systems failure and requires high complexity decision making for assessment and support, frequent evaluation and titration of therapies, application of advanced monitoring technologies and extensive interpretation of multiple databases. Critical Care Time devoted to patient care services described in this note is 35 minutes.  Mcarthur Rossetti. Tyson Alias, MD, FACP Pgr:  [phone] [facility]  01/09/2012, 10:22 AM

## 2012-01-09 NOTE — Progress Notes (Signed)
UR Completed. Simmons, Torren Maffeo F [phone]  

## 2012-01-09 NOTE — Progress Notes (Signed)
CRITICAL VALUE ALERT  Critical value received:  Blood cultures- 1 bottle gram positive cocci in pairs  Date of notification:  01-09-12   Time of notification:  1450  Critical value read back:yes  Nurse who received alert:  Delice Bison  MD notified (1st page):  Tyson Alias  Time of first page:  1500  MD notified (2nd page):  Time of second page:  Responding MD:  Tyson Alias  Time MD responded:  [phone]

## 2012-01-09 NOTE — Progress Notes (Signed)
Nursing: Patient's daughter reports that she had nausea and diarrhea over the weekend and her mother (the patient) reported to her last night (01/07/12) that she was suffering from the same illness. Patient was found with fecal material covering herself when EMS arrived at her home. Per daughter's report " It looked like she had lost bowel control from the bathroom into the living room where we found her".   Based on this information contact precautions started until further noticed. Dr. Delford Field made aware of this information.

## 2012-01-09 NOTE — Progress Notes (Signed)
INITIAL ADULT NUTRITION ASSESSMENT Date: 01/09/2012   Time: 11:16 AM  Reason for Assessment: Consult  ASSESSMENT: Female 61 y.o.  Dx: Anoxic encephalopathy, VDRF, PNA   Hx:  Past Medical History  Diagnosis Date  . Asthma     Related Meds:     . antiseptic oral rinse  15 mL Mouth Rinse QID  . chlorhexidine  15 mL Mouth Rinse BID  . fentaNYL  50 mcg Intravenous Once  . hydrocortisone sod succinate (SOLU-CORTEF) injection  100 mg Intravenous Once  . hydrocortisone sodium succinate  50 mg Intravenous Q6H  . insulin aspart  0-15 Units Subcutaneous Q4H  . levofloxacin (LEVAQUIN) IV  750 mg Intravenous Q48H  . midazolam  2 mg Intravenous Once  . norepinephrine (LEVOPHED) Adult infusion  2-50 mcg/min Intravenous To Major  . pantoprazole (PROTONIX) IV  40 mg Intravenous QHS  . piperacillin-tazobactam (ZOSYN)  IV  3.375 g Intravenous Q8H  . piperacillin-tazobactam  3.375 g Intravenous Once  . vancomycin  1,500 mg Intravenous Q12H  . DISCONTD: hydrocortisone sodium succinate  100 mg Intrathecal Once  . DISCONTD: sodium chloride  500 mL Intravenous Once    Ht: 5\' 5"  (165.1 cm)  Wt: 207 lb 10.8 oz (94.2 kg)  Ideal Wt: 56.8 kg % Ideal Wt: 168%  Usual Wt: unable to obtain % Usual Wt: ---  Body mass index is 34.56 kg/(m^2).  Food/Nutrition Related Hx: no triggers per admission nutrtion   Labs:  CMP     Component Value Date/Time   NA 133* 01/09/2012 0400   K 4.1 01/09/2012 0400   CL 97 01/09/2012 0400   CO2 20 01/09/2012 0400   GLUCOSE 282* 01/09/2012 0400   BUN 52* 01/09/2012 0400   CREATININE 3.38* 01/09/2012 0400   CALCIUM 7.1* 01/09/2012 0400   PROT 4.9* 01/08/2012 1626   ALBUMIN 2.1* 01/08/2012 1626   AST 261* 01/08/2012 1626   ALT 120* 01/08/2012 1626   ALKPHOS 135* 01/08/2012 1626   BILITOT 0.4 01/08/2012 1626   GFRNONAA 14* 01/09/2012 0400   GFRAA 16* 01/09/2012 0400     Intake/Output Summary (Last 24 hours) at 01/09/12 1149 Last data filed at 01/09/12 1000  Gross  per 24 hour  Intake 4312.73 ml  Output    930 ml  Net 3382.73 ml    Diet Order: NPO  Supplements/Tube Feeding: Oxepa formula ordered per Adult Enteral Nutrition Protocol  IVF:    sodium chloride   feeding supplement (OXEPA)   fentaNYL infusion INTRAVENOUS Last Rate: 75 mcg/hr (01/09/12 0300)  midazolam (VERSED) infusion Last Rate: 2 mg/hr (01/09/12 0848)  norepinephrine (LEVOPHED) Adult infusion Last Rate: 8 mcg/min (01/09/12 1000)  vasopressin (PITRESSIN) infusion - *FOR SHOCK*   DISCONTD: sodium chloride Last Rate: 100 mL/hr at 01/08/12 1723  DISCONTD:  sodium bicarbonate infusion 1000 mL Last Rate: 200 mL/hr at 01/09/12 2725  DISCONTD: vasopressin (PITRESSIN) infusion - *FOR SHOCK*     Estimated Nutritional Needs:   Kcal: 2100  Protein: 100-110 gm Fluid: 2.1 L  RD unable to obtain nutrition hx -- pt intubated & sedated; pt with PMH of asthma who presented to the hospital after being found unresponsive; likely has a RUL PNA vs mass; dropped her pressure in the ED and had a cardiac arrest episode; no skin breakdown noted; OGT in place; MD initiated Oxepa formula -- RD consulted to manage EN  NUTRITION DIAGNOSIS: -Inadequate oral intake (NI-2.1).  Status: Ongoing  RELATED TO: inability to eat  AS EVIDENCE BY: NPO status  MONITORING/EVALUATION(Goals): Goal: ENto provide 60-70% of estimated calorie needs (22-25 kcals/kg ideal body weight) and 100% of estimated protein needs, based on ASPEN guidelines for permissive underfeeding in critically ill obese individuals Monitor: EN tolerance, respiratory status, weight, labs, I/O's  EDUCATION NEEDS: -No education needs identified at this time  INTERVENTION:  Oxpea at goal rate of 30 ml/hr with Prostat liquid protein 30 ml QID via tube to provide 1368 kcals (65% of estimated kcal needs), 105 gm protein (100% of estimated protein needs), 565 ml of free water  RD to follow for nutrition care plan  Dietitian #:  [phone]  DOCUMENTATION CODES Per approved criteria  -Obesity Unspecified    Emily Harrington 01/09/2012, 11:16 AM

## 2012-01-09 NOTE — Progress Notes (Signed)
ANTIBIOTIC CONSULT NOTE - FOLLOW UP  Pharmacy Consult for Levaquin Indication: possible PNA and septic shock  Vital Signs: Temp: 100.9 F (38.3 C) (03/21 1300) Temp src: Core (Comment) (03/21 0400) BP: 93/49 mmHg (03/21 1300) Pulse Rate: 107  (03/21 1300) Intake/Output from previous day: 03/20 0701 - 03/21 0700 In: 3575.1 [I.V.:2955.1; NG/GT:70; IV Piggyback:550] Out: 805 [Urine:805] Intake/Output from this shift: Total I/O In: 1007.5 [I.V.:777.5; NG/GT:30; IV Piggyback:200] Out: 250 [Urine:250]  Labs:  [facility] 01/09/12 0400 01/08/12 1626  WBC 21.6* 26.0*  HGB 11.4* 10.7*  PLT 217 249  LABCREA -- --  CREATININE 3.38* 3.91*   Estimated Creatinine Clearance: 19.8 ml/min (by C-G formula based on Cr of 3.38). No results found for this basename: VANCOTROUGH:2,VANCOPEAK:2,VANCORANDOM:2,GENTTROUGH:2,GENTPEAK:2,GENTRANDOM:2,TOBRATROUGH:2,TOBRAPEAK:2,TOBRARND:2,AMIKACINPEAK:2,AMIKACINTROU:2,AMIKACIN:2, in the last 72 hours   Microbiology: Recent Results (from the past 720 hour(s))  CULTURE, RESPIRATORY     Status: Normal (Preliminary result)   Collection Time   01/08/12  4:27 PM      Component Value Range Status Comment   Specimen Description ENDOTRACHEAL   Final    Special Requests NONE   Final    Gram Stain     Final    Value: FEW WBC PRESENT,BOTH PMN AND MONONUCLEAR     RARE SQUAMOUS EPITHELIAL CELLS PRESENT     FEW GRAM POSITIVE COCCI IN PAIRS     RARE GRAM POSITIVE RODS     RARE GRAM NEGATIVE RODS   Culture PENDING   Incomplete    Report Status PENDING   Incomplete   MRSA PCR SCREENING     Status: Normal   Collection Time   01/08/12  5:48 PM      Component Value Range Status Comment   MRSA by PCR NEGATIVE  NEGATIVE  Final     Anti-infectives     Start     Dose/Rate Route Frequency Ordered Stop   01/09/12 1100   Levofloxacin (LEVAQUIN) IVPB 750 mg        750 mg 100 mL/hr over 90 Minutes Intravenous Every 48 hours 01/09/12 1028     01/08/12 2300   piperacillin-tazobactam (ZOSYN) IVPB 3.375 g       3.375 g 12.5 mL/hr over 240 Minutes Intravenous Every 8 hours 01/08/12 1659     01/08/12 1800   vancomycin (VANCOCIN) 1,500 mg in sodium chloride 0.9 % 500 mL IVPB        1,500 mg 250 mL/hr over 120 Minutes Intravenous Every 12 hours 01/08/12 1659     01/08/12 1700  piperacillin-tazobactam (ZOSYN) IVPB 3.375 g       3.375 g 100 mL/hr over 30 Minutes Intravenous  Once 01/08/12 1659 01/08/12 2043          Assessment: 61 year old female admitted 3/20 after being found SOB and unresponsive having a cardiac arrest in the ED. Patient was started on broad abx with vancomycin and zosyn 3/20 will broaden coverage with levaquin today. Given renal dysfunction will adjust dose to q48h. Will also need to adjust  Vancomycin even though renal fx is slowly improving.  Goal of Therapy:  Vancomycin trough 15-20  Plan:  1.Levaquin 750mg  q 48 hours 2.Continue zosyn 3.375 q 8 hours - if further decline will need dose adjustment 3.Change vancomycin to 1g iv q24 hours 4.Follow renal function for changes  Severiano Gilbert 01/09/2012,1:54 PM

## 2012-01-09 NOTE — Progress Notes (Signed)
CRITICAL VALUE ALERT  Critical value received: Troponin 0.49  Date of notification:  01/09/12  Time of notification:  0105  Critical value read back:yes  Nurse who received alert:  Rocky Link RN  MD notified (1st page):  Dr. Darrick Penna   Time of first page:  n/a  MD notified (2nd page):n/a  Time of second page: n/a  Responding MD: Dr. Darrick Penna  Time MD responded:  [phone]

## 2012-01-09 NOTE — Plan of Care (Signed)
Problem: Phase I Progression Outcomes Goal: VTE prophylaxis Outcome: Completed/Met Date Met:  01/09/12 Pt has bilateral SCDs on.

## 2012-01-10 ENCOUNTER — Inpatient Hospital Stay (HOSPITAL_COMMUNITY): Payer: Medicaid Other

## 2012-01-10 LAB — GLUCOSE, CAPILLARY
Glucose-Capillary: 109 mg/dL — ABNORMAL HIGH (ref 70–99)
Glucose-Capillary: 118 mg/dL — ABNORMAL HIGH (ref 70–99)
Glucose-Capillary: 132 mg/dL — ABNORMAL HIGH (ref 70–99)
Glucose-Capillary: 152 mg/dL — ABNORMAL HIGH (ref 70–99)
Glucose-Capillary: 153 mg/dL — ABNORMAL HIGH (ref 70–99)

## 2012-01-10 LAB — COMPREHENSIVE METABOLIC PANEL
Albumin: 1.7 g/dL — ABNORMAL LOW (ref 3.5–5.2)
Alkaline Phosphatase: 188 U/L — ABNORMAL HIGH (ref 39–117)
BUN: 63 mg/dL — ABNORMAL HIGH (ref 6–23)
Calcium: 7.3 mg/dL — ABNORMAL LOW (ref 8.4–10.5)
Creatinine, Ser: 3.19 mg/dL — ABNORMAL HIGH (ref 0.50–1.10)
GFR calc Af Amer: 17 mL/min — ABNORMAL LOW (ref >90)
Glucose, Bld: 154 mg/dL — ABNORMAL HIGH (ref 70–99)
Potassium: 3.9 mEq/L (ref 3.5–5.1)
Total Protein: 5 g/dL — ABNORMAL LOW (ref 6.0–8.3)

## 2012-01-10 LAB — CLOSTRIDIUM DIFFICILE BY PCR: Toxigenic C. Difficile by PCR: NEGATIVE

## 2012-01-10 LAB — DIFFERENTIAL
Basophils Absolute: 0 10*3/uL (ref 0.0–0.1)
Eosinophils Relative: 0 % (ref 0–5)
Lymphocytes Relative: 3 % — ABNORMAL LOW (ref 12–46)
Lymphs Abs: 0.8 10*3/uL (ref 0.7–4.0)
Monocytes Absolute: 0.6 10*3/uL (ref 0.1–1.0)
Neutro Abs: 23.5 10*3/uL — ABNORMAL HIGH (ref 1.7–7.7)

## 2012-01-10 LAB — PROTIME-INR: Prothrombin Time: 17.7 seconds — ABNORMAL HIGH (ref 11.6–15.2)

## 2012-01-10 LAB — CBC
HCT: 30.3 % — ABNORMAL LOW (ref 36.0–46.0)
Hemoglobin: 9.8 g/dL — ABNORMAL LOW (ref 12.0–15.0)
MCV: 86.1 fL (ref 78.0–100.0)
RBC: 3.52 MIL/uL — ABNORMAL LOW (ref 3.87–5.11)
RDW: 16 % — ABNORMAL HIGH (ref 11.5–15.5)
WBC: 24.9 10*3/uL — ABNORMAL HIGH (ref 4.0–10.5)

## 2012-01-10 LAB — APTT: aPTT: 44 seconds — ABNORMAL HIGH (ref 24–37)

## 2012-01-10 LAB — POCT I-STAT 3, ART BLOOD GAS (G3+)
O2 Saturation: 98 %
Patient temperature: 100.9
pCO2 arterial: 44 mmHg (ref 35.0–45.0)

## 2012-01-10 LAB — LEGIONELLA ANTIGEN, URINE

## 2012-01-10 MED ORDER — ALBUTEROL SULFATE HFA 108 (90 BASE) MCG/ACT IN AERS
4.0000 | INHALATION_SPRAY | Freq: Four times a day (QID) | RESPIRATORY_TRACT | Status: DC
  Administered 2012-01-10 – 2012-01-11 (×3): 4 via RESPIRATORY_TRACT
  Filled 2012-01-10: qty 6.7

## 2012-01-10 MED ORDER — SODIUM CHLORIDE 0.9 % IV SOLN
INTRAVENOUS | Status: DC | PRN
  Administered 2012-01-10: 20:00:00 via INTRAVENOUS

## 2012-01-10 NOTE — Progress Notes (Signed)
Clinical Social Work Department BRIEF PSYCHOSOCIAL ASSESSMENT 01/10/2012  Patient:  Emily Harrington, Emily Harrington     Account Number:  [id number]     Admit date:  01/08/2012  Clinical Social Worker:  Margaree Mackintosh  Date/Time:  01/10/2012 01:41 PM  Referred by:  Physician  Date Referred:  01/09/2012 Referred for  Other - See comment   Other Referral:   No insurance, lack of community resources.   Interview type:  Family Other interview type:   Dtr-Michelle    PSYCHOSOCIAL DATA Living Status:  ALONE Admitted from facility:   Level of care:   Primary support name:  shannon Primary support relationship to patient:  CHILD, ADULT Degree of support available:   adequate.    CURRENT CONCERNS Current Concerns  Other - See comment   Other Concerns:   Lack of insurance and community resources.    SOCIAL WORK ASSESSMENT / PLAN Clinical Social Worker met with pt's dtr in waiting room. CSW introduced self and explained role.  CSW provided opportunity for dtr to express current concerns/quesitons. CSW validated difficult feelings related to hospitalizations.  CSW provided resource information to dtr Research scientist (physical sciences), [employer], and a PCP in area who accepts self pay).  Dtr was Environmental manager.  At this time, CSW signing off. Please re consult if additional needs arise.   Assessment/plan status:  No Further Intervention Required Other assessment/ plan:   Information/referral to community resources:   Field seismologist  [employer]    PATIENT'S/FAMILY'S RESPONSE TO PLAN OF CARE: Pt currently unable to fully participate in assessment. Dtr thankful for interventions.        Angelia Mould, MSW, [place] [phone]

## 2012-01-10 NOTE — Progress Notes (Signed)
Name: Emily Harrington MRN: [id number] DOB: 07-Jun-1951    LOS: 2  PCCM ADMISSION NOTE  History of Present Illness: 61 year old female with unknown PMH who crawled from her room and lost consciousness.  EMS was called and patient was brought to the ED.  Enroute the patient decompensated and became combative and unable to protect airway.  Patient also dropped her pressure and was intubated enroute to the ED.  Lines / Drains: L [place] TLC 3/20>>> R femoral a-line 3/20>>> ET tube 3/20>>>  Cultures: Blood 3/20>>>gpc pairs 1/2>>> Sputum 3/20>>> Urine 3/20>>> cdiff  Neg 3/22  Antibiotics: Vancomycin 3/20>>> Zosyn 3/20>>> levofloxa 3/21>>>  Tests / Events: Cardiac arrest in the ED 3/20>>> 3/20 ct abdo/pelvis- neg acute 3/20 Ct chest -Large area of airspace consolidation involving the right upper<BR>lobe most likely represents bacterial pneumonia. This will need<BR>interval radiographic followup to confirm resolution and to exclude<BR>an underlying neoplastic process which may have a similar<BR>appearance 3/20- EGDT 3/21- pos BC, off pressors  Vital Signs: Filed Vitals:   01/10/12 0800  BP:   Pulse: 94  Temp: 100.4 F (38 C)  Resp: 16    Intake/Output Summary (Last 24 hours) at 01/10/12 4098 Last data filed at 01/10/12 0800  Gross per 24 hour  Intake 3185.8 ml  Output   1085 ml  Net 2100.8 ml    Ventilator settings: Vent Mode:  [-] PRVC FiO2 (%):  [40 %] 40 % Set Rate:  [16 bmp-24 bmp] 16 bmp Vt Set:  [530 mL] 530 mL PEEP:  [5 cmH20] 5 cmH20 Plateau Pressure:  [0 cmH20-20 cmH20] 20 cmH20  Physical Examination: General:  Chronically ill, vented Neuro:  perrl 3 mm, rass -2 HEENT:  jvd wnl Neck:  Supple Cardiovascular:  RRR, Nl S1/S2, -M/R/G. Lungs:  Diffuse crackles rt improved Abdomen:  Soft, NT, ND and +BS. Musculoskeletal:  -edema  Skin:  Multiple scars.  Labs and Imaging:   Labs: CBC    Component Value Date/Time   WBC 24.9* 01/10/2012 0353    BMET   Component Value Date/Time   NA 137 01/10/2012 0353   ABG    Component Value Date/Time   PHART 7.371 01/10/2012 0416     Lab 01/09/12 0400  MG 1.5   Lab Results  Component Value Date   CALCIUM 7.3* 01/10/2012    Assessment and Plan: 69 yead old female with PMH of asthma who presents to the hospital after being found SOB and unresponsive.  Patient likely has a RUL PNA vs mass.  Patient dropped her pressure in the ED and had a cardiac arrest episode.  Original rhythm was PEA, after CXR, infiltrate were noted, ?of [place], Echo ordered emergently and RV was normal and LV was active.  This is likely a PNA with septic shock.  Neuro: Anoxic encephalopathy, septic encephalapthy Plan: - Head CT neg acute  - Intermittens sedation, WUA  -avoiding versed  -Fent to rass goal -1  -eeg assess subclinical seziures  Avoid high rates with h/o asthma  BDers, no role roids  CV: Shock, likely septic with a RUL PNA. Plan:  Off pressors Would kvo No role steroids, see cortisol--> dc  Pulmonary: VDRF with RUL PNA in a patient with history of sepsis. Plan: abg reviewed, maintain current MV sbt planned, cpap5 ps 5, no intention to extubate until neurstaus improves? pcxr unchaged, no role bronch as culture blood pos Will need CT in future if wake sup to eval mass  Renal: unknown function for now No  hydro on CT rewolving slowly cvp 11, kvo  GI:  TF, ppi, no stools noted  ID: RUL PNA, vanc/zosyn and pan culture. Pairs in blood, follow ID and sens, likely strep pneuo Then narrow No real risks for ceph res strep, so likely vanc to dc and narrow to one agent If mass underlying, at risk post obstructive, may need continue anaerobes coverage  Endocrine: ssi Cortisol adaquate   The patient is critically ill with multiple organ systems failure and requires high complexity decision making for assessment and support, frequent evaluation and titration of therapies, application of advanced  monitoring technologies and extensive interpretation of multiple databases. Critical Care Time devoted to patient care services described in this note is 35 minutes.  Mcarthur Rossetti. Tyson Alias, MD, FACP Pgr: [phone] [facility]  01/10/2012, 9:22 AM

## 2012-01-10 NOTE — Progress Notes (Signed)
Nursing: Versed drip at 1mg  and Fentanyl at at beginning of shift. Per previous RN- MD wanted versed weaned off and Fentanyl increased if needed. Decreased Versed drip to 0.5mg  for 2 hours and then turned off at 2200. Patient calm and cooperative on Fentanyl drip only. During bath at 0330 later in shift patient cooperative until it was time to turn on side to change linen. Patient became agitated, bucking ventilator, slinging legs up in air and off the bed aggressively. Patient had been on Fentanyl drip only since 2200 earlier in the night, patient opens eyes and moves all extremities but will not follow any commands during episode of agitation. Fentanyl and 2mg  Versed bolus given via pump. No resolution of agitation and not able to remove dirty linen after bolus, so both boluses repeated. Versed drip left off after boluses and will continue to monitor. Normajean Baxter

## 2012-01-11 ENCOUNTER — Inpatient Hospital Stay (HOSPITAL_COMMUNITY): Payer: Medicaid Other

## 2012-01-11 DIAGNOSIS — R6521 Severe sepsis with septic shock: Secondary | ICD-10-CM

## 2012-01-11 DIAGNOSIS — A419 Sepsis, unspecified organism: Secondary | ICD-10-CM

## 2012-01-11 DIAGNOSIS — J13 Pneumonia due to Streptococcus pneumoniae: Secondary | ICD-10-CM

## 2012-01-11 DIAGNOSIS — J9600 Acute respiratory failure, unspecified whether with hypoxia or hypercapnia: Secondary | ICD-10-CM

## 2012-01-11 DIAGNOSIS — R7881 Bacteremia: Secondary | ICD-10-CM

## 2012-01-11 LAB — DIFFERENTIAL
Basophils Relative: 0 % (ref 0–1)
Monocytes Absolute: 0.7 10*3/uL (ref 0.1–1.0)
Monocytes Relative: 5 % (ref 3–12)
Neutro Abs: 13.7 10*3/uL — ABNORMAL HIGH (ref 1.7–7.7)

## 2012-01-11 LAB — CULTURE, BLOOD (ROUTINE X 2)

## 2012-01-11 LAB — COMPREHENSIVE METABOLIC PANEL
ALT: 100 U/L — ABNORMAL HIGH (ref 0–35)
AST: 103 U/L — ABNORMAL HIGH (ref 0–37)
Albumin: 1.7 g/dL — ABNORMAL LOW (ref 3.5–5.2)
Calcium: 8.2 mg/dL — ABNORMAL LOW (ref 8.4–10.5)
GFR calc Af Amer: 17 mL/min — ABNORMAL LOW (ref >90)
Glucose, Bld: 94 mg/dL (ref 70–99)
Sodium: 138 mEq/L (ref 135–145)
Total Protein: 5.2 g/dL — ABNORMAL LOW (ref 6.0–8.3)

## 2012-01-11 LAB — CBC
HCT: 28.7 % — ABNORMAL LOW (ref 36.0–46.0)
Hemoglobin: 9.3 g/dL — ABNORMAL LOW (ref 12.0–15.0)
MCHC: 32.4 g/dL (ref 30.0–36.0)
MCV: 88.3 fL (ref 78.0–100.0)

## 2012-01-11 LAB — CULTURE, RESPIRATORY W GRAM STAIN

## 2012-01-11 LAB — GLUCOSE, CAPILLARY: Glucose-Capillary: 95 mg/dL (ref 70–99)

## 2012-01-11 MED ORDER — WHITE PETROLATUM GEL
Status: AC
  Administered 2012-01-11: 21:00:00
  Filled 2012-01-11: qty 5

## 2012-01-11 MED ORDER — ALBUTEROL SULFATE (5 MG/ML) 0.5% IN NEBU
2.5000 mg | INHALATION_SOLUTION | RESPIRATORY_TRACT | Status: DC | PRN
  Administered 2012-01-11 – 2012-01-23 (×7): 2.5 mg via RESPIRATORY_TRACT
  Filled 2012-01-11 (×7): qty 0.5

## 2012-01-11 MED ORDER — BIOTENE DRY MOUTH MT LIQD
15.0000 mL | Freq: Two times a day (BID) | OROMUCOSAL | Status: DC
  Administered 2012-01-11 – 2012-01-17 (×13): 15 mL via OROMUCOSAL

## 2012-01-11 MED ORDER — POTASSIUM CHLORIDE 10 MEQ/50ML IV SOLN
10.0000 meq | INTRAVENOUS | Status: AC
  Administered 2012-01-11 (×4): 10 meq via INTRAVENOUS
  Filled 2012-01-11 (×4): qty 50

## 2012-01-11 MED ORDER — PANTOPRAZOLE SODIUM 40 MG PO TBEC
40.0000 mg | DELAYED_RELEASE_TABLET | Freq: Every day | ORAL | Status: DC
  Administered 2012-01-11: 40 mg via ORAL
  Filled 2012-01-11: qty 1

## 2012-01-11 MED ORDER — METHYLPREDNISOLONE SODIUM SUCC 125 MG IJ SOLR
80.0000 mg | Freq: Once | INTRAMUSCULAR | Status: AC
  Administered 2012-01-11: 80 mg via INTRAVENOUS
  Filled 2012-01-11: qty 2

## 2012-01-11 NOTE — Progress Notes (Signed)
Name: Emily Harrington MRN: [id number] DOB: Feb 09, 1951    LOS: 3  PCCM FOLLOW UP NOTE  History of Present Illness: 61 year old female with unknown PMH who crawled from her room and lost consciousness.  EMS was called and patient was brought to the ED.  Enroute the patient decompensated and became combative and unable to protect airway.  Patient also dropped her pressure and was intubated enroute to the ED.  Lines / Drains: L [place] TLC 3/20>>> R femoral a-line 3/20>>> 3/22 ET tube 3/20>> 3/23  Cultures: Blood 3/20 >> 1/2 pneumococcus Sputum 3/20>>> few E coli Urine 3/20>>> NEG cdiff  Neg 3/22  Antibiotics: Vancomycin 3/20>>> 3/23 Zosyn 3/20>>> 3/23 levofloxa 3/21 >>  Tests / Events: Cardiac arrest in the ED 3/20 3/20 ct abdo/pelvis- neg acute 3/20 Ct chest -Large area of airspace consolidation involving the right upper<BR>lobe most likely represents bacterial pneumonia. This will need<BR>interval radiographic followup to confirm resolution and to exclude<BR>an underlying neoplastic process which may have a similar<BR>appearance 3/20- EGDT 3/21- pos BC, off pressors  SUBJ: Passed SBT. + F/C. Now extubated and looks good except post ext stridor  Vital Signs: Filed Vitals:   01/11/12 1400  BP:   Pulse: 106  Temp: 100.6 F (38.1 C)  Resp: 16    Intake/Output Summary (Last 24 hours) at 01/11/12 1531 Last data filed at 01/11/12 1400  Gross per 24 hour  Intake 1857.5 ml  Output   1395 ml  Net  462.5 ml     Physical Examination: General:  Chronically ill, nad Neuro:  RASS 0, + f/c HEENT:  jvd wnl Neck:  Supple Cardiovascular:  RRR, Nl S1/S2, -M/R/G. Lungs:  Diffuse crackles rt improved Abdomen:  Soft, NT, ND and +BS. Musculoskeletal:  -edema  Skin:  Multiple scars.  Labs and Imaging:   Labs: BMET    Component Value Date/Time   NA 138 01/11/2012 0340   K 3.2* 01/11/2012 0340   CL 98 01/11/2012 0340   CO2 26 01/11/2012 0340   GLUCOSE 94 01/11/2012 0340   BUN  78* 01/11/2012 0340   CREATININE 3.24* 01/11/2012 0340   CALCIUM 8.2* 01/11/2012 0340   GFRNONAA 14* 01/11/2012 0340   GFRAA 17* 01/11/2012 0340    CBC    Component Value Date/Time   WBC 15.4* 01/11/2012 0340   RBC 3.25* 01/11/2012 0340   HGB 9.3* 01/11/2012 0340   HCT 28.7* 01/11/2012 0340   PLT 147* 01/11/2012 0340   MCV 88.3 01/11/2012 0340   MCH 28.6 01/11/2012 0340   MCHC 32.4 01/11/2012 0340   RDW 16.0* 01/11/2012 0340   LYMPHSABS 1.0 01/11/2012 0340   MONOABS 0.7 01/11/2012 0340   EOSABS 0.0 01/11/2012 0340   BASOSABS 0.0 01/11/2012 0340    CXR: NSC    Assessment and Plan: Pneumococcal PNA with resolved severe sepsis/shock - Narrow abx to Levoflox alone. Complete 10 d course  Acute vent dep resp failure -Extubated 3/23 and looks good except mild post ext stridor  Single dose of solumedrol ordered  PRN albuterol ordered    Acute renal insufficiency, likely sepsis related ATN. Nonoliguric. No indication for HD. Cont to monitor    CCM X 30 mins Daughter updated over phone  Billy Fischer, MD;  PCCM service; Mobile [phone]

## 2012-01-11 NOTE — Procedures (Signed)
EEG NUMBER:  REFERRING PHYSICIAN:  Mcarthur Rossetti. Tyson Alias, MD  HISTORY:  A 61 year old female, found unconscious, now unresponsive.  MEDICATIONS:  Versed, Sublimaze, NovoLog, Protonix, Vancocin, Pitressin.  CONDITIONS OF RECORDING:  This is a 16-channel EEG carried out with the patient in the poorly responsive state.  DESCRIPTION:  No dominant rhythm is noted during the tracing.  There are mixture of frequencies with theta and alpha rhythms being most predominant.  The theta rhythms are most prevalent.  Some what appeared to be normal sleep transients were noted as well with symmetrical sleep spindles and vertex central sharp transients noted.  The patient does have activating procedures performed during the tracing.  Background rhythm becomes faster and does seem to show response to these activating procedures.  The patient is also noted, during the tracing, to have episodes of rhythmic jerking without any EEG correlate noted. Hypoventilation was not performed.  Intermittent photic stimulation failed to elicit any change in the tracing.  IMPRESSION:  This is an EEG that was dominated by slower rhythms. Although, this can be seen in a diffuse disturbance that is etiologically nonspecific, cannot rule out the possibility that the patient may be in a stage of sleep during this tracing.  Activation was noted during the tracing.  No epileptiform activities were noted.          ______________________________ Thana Farr, MD    AV:[id number] D:  01/10/2012 18:57:46  T:  01/10/2012 22:19:49  Job #:  [id number]

## 2012-01-11 NOTE — Progress Notes (Signed)
Pt is wheezy with a raspy voice and is sob.  Spo2 fell to 74% on nasal cannula at 4 liters/min.  Pt was placed on a NRB at 100%.  RT was called to assess and Dr. Sung Amabile was called and new orders were received.

## 2012-01-11 NOTE — Procedures (Signed)
Extubation Procedure Note  Patient Details:   Name: MARVEEN DONLON DOB: January 25, 1951 MRN: [id number]   Airway Documentation:  Airway 7 mm (Active)  Secured at (cm) 22 cm 01/11/2012  8:02 AM  Measured From Lips 01/11/2012  8:02 AM  Secured Location Left 01/11/2012  8:02 AM  Secured By [employer] 01/11/2012  8:02 AM  Tube Holder Repositioned Yes 01/11/2012  8:02 AM  Cuff Pressure (cm H2O) 25 cm H2O 01/11/2012  4:46 AM  Site Condition Dry 01/09/2012  4:30 PM    Evaluation  O2 sats:  Complications:  Patient Pt extubated today at 9:20 per Dr. Jarrett Ables.  Had been on pc/cpap of 30% 5, 15 and tolerated well.  Placed on 4 L [place] Sats running 96  Firmin Belisle, [place] 01/11/2012, 12:37 PM

## 2012-01-11 NOTE — Progress Notes (Signed)
Pt was extubated to nasal cannula per RT per order of Dr. Sung Amabile.

## 2012-01-11 NOTE — Progress Notes (Signed)
Telephoned Dr. Sung Amabile to report pts increased work of breathing and rhonchi and wheezy breath sounds.  Spo2 is 97-98% on 4 liters/nasal cannula.

## 2012-01-12 MED ORDER — LEVOFLOXACIN 250 MG PO TABS
250.0000 mg | ORAL_TABLET | Freq: Every day | ORAL | Status: DC
  Filled 2012-01-12: qty 1

## 2012-01-12 MED ORDER — ENOXAPARIN SODIUM 40 MG/0.4ML ~~LOC~~ SOLN
40.0000 mg | SUBCUTANEOUS | Status: DC
  Administered 2012-01-12: 40 mg via SUBCUTANEOUS
  Filled 2012-01-12 (×2): qty 0.4

## 2012-01-12 MED ORDER — LEVOFLOXACIN 500 MG PO TABS
500.0000 mg | ORAL_TABLET | Freq: Once | ORAL | Status: AC
  Administered 2012-01-12: 500 mg via ORAL
  Filled 2012-01-12: qty 1

## 2012-01-12 MED ORDER — MORPHINE SULFATE 2 MG/ML IJ SOLN
2.0000 mg | INTRAMUSCULAR | Status: DC | PRN
  Administered 2012-01-12 – 2012-01-16 (×10): 2 mg via INTRAVENOUS
  Filled 2012-01-12 (×8): qty 1
  Filled 2012-01-12: qty 2
  Filled 2012-01-12: qty 1

## 2012-01-12 MED ORDER — LEVOFLOXACIN 250 MG PO TABS
250.0000 mg | ORAL_TABLET | Freq: Every day | ORAL | Status: DC
  Administered 2012-01-13 – 2012-01-17 (×5): 250 mg via ORAL
  Filled 2012-01-12 (×6): qty 1

## 2012-01-12 NOTE — Progress Notes (Signed)
Name: Emily Harrington MRN: [id number] DOB: 1951-04-05    LOS: 4  PCCM FOLLOW UP NOTE  History of Present Illness: 61 year old female with unknown PMH who crawled from her room and lost consciousness.  EMS was called and patient was brought to the ED.  Enroute the patient decompensated and became combative and unable to protect airway.  Patient also dropped her pressure and was intubated enroute to the ED.  Lines / Drains: L [place] TLC 3/20>>> 3/24 R femoral a-line 3/20>>> 3/22 ET tube 3/20>> 3/23  Cultures: Blood 3/20 >> 1/2 pneumococcus Sputum 3/20>>> few E coli Urine 3/20>>> NEG cdiff  Neg 3/22  Antibiotics: Vancomycin 3/20>>> 3/23 Zosyn 3/20>>> 3/23 levofloxa 3/21 >>  Tests / Events: Cardiac arrest in the ED 3/20 3/20 ct abdo/pelvis- neg acute 3/20 Ct chest -Large area of airspace consolidation involving the right upper<BR>lobe most likely represents bacterial pneumonia. This will need<BR>interval radiographic followup to confirm resolution and to exclude<BR>an underlying neoplastic process which may have a similar<BR>appearance 3/20- EGDT 3/21- pos BC, off pressors  SUBJ: No distress. No complaints. RASS 0 to -1. + F/C  Vital Signs: Filed Vitals:   01/12/12 0700  BP: 143/87  Pulse: 85  Temp: 97.7 F (36.5 C)  Resp: 8    Intake/Output Summary (Last 24 hours) at 01/12/12 1128 Last data filed at 01/12/12 0900  Gross per 24 hour  Intake    830 ml  Output   1205 ml  Net   -375 ml     Physical Examination: General:  NAD Neuro:  Rno focal deficits HEENT:  jvd not visualized Neck:  Supple s LAN Cardiovascular:  RRR, Nl S1/S2, -M/R/G. Lungs:  RUL posterior rales Abdomen:  Soft, NT, ND and +BS. Musculoskeletal:  -edema  Skin:  Multiple scars.  Labs and Imaging:   No new labs or CXR    Assessment and Plan: Pneumococcal PNA with resolved severe sepsis/shock - Change to oral Levoflox alone. Complete 10 d course   Acute vent dep resp failure -  resolved   Acute renal insufficiency, likely sepsis related ATN. Nonoliguric. No indication for HD. Recheck BMET AM 3/25   Transfer to SDU  Billy Fischer, MD;  PCCM service; Mobile [phone]

## 2012-01-12 NOTE — Progress Notes (Signed)
Attempt to stand and pivot back to bed with assistance of two RNs unsuccessful.  Pt. Went limp mid transfer requiring assist to seated position on floor and use of maxi sky to lift patient into bed.  Vital signs stable and assessment unchanged from am.

## 2012-01-13 ENCOUNTER — Inpatient Hospital Stay (HOSPITAL_COMMUNITY): Payer: Medicaid Other

## 2012-01-13 LAB — CBC
HCT: 32.4 % — ABNORMAL LOW (ref 36.0–46.0)
Hemoglobin: 10.4 g/dL — ABNORMAL LOW (ref 12.0–15.0)
MCH: 28.3 pg (ref 26.0–34.0)
MCHC: 32.1 g/dL (ref 30.0–36.0)
MCV: 88 fL (ref 78.0–100.0)

## 2012-01-13 LAB — BASIC METABOLIC PANEL
BUN: 86 mg/dL — ABNORMAL HIGH (ref 6–23)
Chloride: 100 mEq/L (ref 96–112)
Glucose, Bld: 65 mg/dL — ABNORMAL LOW (ref 70–99)
Potassium: 3.5 mEq/L (ref 3.5–5.1)

## 2012-01-13 MED ORDER — POTASSIUM CHLORIDE 20 MEQ/15ML (10%) PO LIQD
40.0000 meq | Freq: Three times a day (TID) | ORAL | Status: DC
  Filled 2012-01-13 (×2): qty 30

## 2012-01-13 MED ORDER — POTASSIUM CHLORIDE 20 MEQ/15ML (10%) PO LIQD: 40.0000 meq | Freq: Three times a day (TID) | ORAL | Status: DC

## 2012-01-13 MED ORDER — POTASSIUM CHLORIDE CRYS ER 20 MEQ PO TBCR
EXTENDED_RELEASE_TABLET | ORAL | Status: AC
  Filled 2012-01-13: qty 2

## 2012-01-13 MED ORDER — ENOXAPARIN SODIUM 30 MG/0.3ML ~~LOC~~ SOLN
30.0000 mg | SUBCUTANEOUS | Status: DC
  Administered 2012-01-13 – 2012-01-26 (×14): 30 mg via SUBCUTANEOUS
  Filled 2012-01-13 (×14): qty 0.3

## 2012-01-13 MED ORDER — OXYCODONE-ACETAMINOPHEN 5-325 MG PO TABS
1.0000 | ORAL_TABLET | ORAL | Status: DC | PRN
  Administered 2012-01-13: 2 via ORAL
  Administered 2012-01-13 – 2012-01-14 (×6): 1 via ORAL
  Administered 2012-01-14 – 2012-01-15 (×2): 2 via ORAL
  Administered 2012-01-15 (×4): 1 via ORAL
  Filled 2012-01-13 (×2): qty 2
  Filled 2012-01-13: qty 1
  Filled 2012-01-13: qty 2
  Filled 2012-01-13 (×4): qty 1
  Filled 2012-01-13: qty 2
  Filled 2012-01-13 (×3): qty 1
  Filled 2012-01-13: qty 2
  Filled 2012-01-13: qty 1

## 2012-01-13 MED ORDER — MAGNESIUM SULFATE 40 MG/ML IJ SOLN
2.0000 g | Freq: Once | INTRAMUSCULAR | Status: AC
  Administered 2012-01-13: 2 g via INTRAVENOUS
  Filled 2012-01-13: qty 50

## 2012-01-13 MED ORDER — ASPIRIN 81 MG PO CHEW
CHEWABLE_TABLET | ORAL | Status: AC
  Administered 2012-01-13: 162 mg
  Filled 2012-01-13: qty 2

## 2012-01-13 MED ORDER — ASPIRIN EC 81 MG PO TBEC
162.0000 mg | DELAYED_RELEASE_TABLET | Freq: Every day | ORAL | Status: DC
  Administered 2012-01-14 – 2012-02-14 (×31): 162 mg via ORAL
  Filled 2012-01-13 (×33): qty 2

## 2012-01-13 MED ORDER — POTASSIUM CHLORIDE CRYS ER 20 MEQ PO TBCR
40.0000 meq | EXTENDED_RELEASE_TABLET | Freq: Three times a day (TID) | ORAL | Status: AC
  Administered 2012-01-13 (×2): 40 meq via ORAL
  Filled 2012-01-13: qty 2

## 2012-01-13 NOTE — Progress Notes (Signed)
Name: MAIE KESINGER MRN: [id number] DOB: 25-Jun-1951    LOS: 5  PCCM FOLLOW UP NOTE  History of Present Illness: 61 year old female with unknown PMH who crawled from her room and lost consciousness.  EMS was called and patient was brought to the ED.  Enroute the patient decompensated and became combative and unable to protect airway.  Patient also dropped her pressure and was intubated enroute to the ED.  Lines / Drains: L [place] TLC 3/20>>> 3/24 R femoral a-line 3/20>>> 3/22 ET tube 3/20>> 3/23  Cultures: Blood 3/20 >> 1/2 pneumococcus Sputum 3/20>>> few E coli pan sensitive Urine 3/20>>> NEG cdiff  Neg 3/22  Antibiotics: Vancomycin 3/20>>> 3/23 Zosyn 3/20>>> 3/23 levofloxa 3/21 >>  Tests / Events: Cardiac arrest in the ED 3/20 3/20 ct abdo/pelvis- neg acute 3/20 Ct chest -Large area of airspace consolidation involving the right upper<BR>lobe most likely represents bacterial pneumonia. This will need<BR>interval radiographic followup to confirm resolution and to exclude<BR>an underlying neoplastic process which may have a similar<BR>appearance 3/20- EGDT 3/21- pos BC, off pressors  SUBJ: No distress. Complaints of sternal pain due to chest compression.  Vital Signs: Filed Vitals:   01/13/12 0826  BP:   Pulse:   Temp: 98.7 F (37.1 C)  Resp:     Intake/Output Summary (Last 24 hours) at 01/13/12 0942 Last data filed at 01/13/12 0900  Gross per 24 hour  Intake    940 ml  Output    850 ml  Net     90 ml   Physical Examination: General:  NAD Neuro:  Rno focal deficits HEENT:  jvd not visualized Neck:  Supple s LAN Cardiovascular:  RRR, Nl S1/S2, -M/R/G. Lungs:  RUL posterior rales Abdomen:  Soft, NT, ND and +BS. Musculoskeletal:  -edema  Skin:  Multiple scars.  Labs and Imaging:   No new labs or CXR  Assessment and Plan: 78 yead old female with PMH of asthma who presents to the hospital after being found SOB and unresponsive. Patient likely has a RUL PNA  vs mass. Patient dropped her pressure in the ED and had a cardiac arrest episode. Original rhythm was PEA, after CXR, infiltrate were noted, ?of [place], Echo ordered emergently and RV was normal and LV was active. This is likely a PNA with septic shock.   Neuro: Improved now that sedation is off.  Plan:  - Head CT.   - Intermittens sedation, patient localizing.   CV: Shock, likely septic with a RUL PNA.  Resolved. Plan:  - Check CVP.   - Will likely need septic shock protocol if CVP is low.   - Levofloxacin PO day 5/8.   - D/C pressors.  - D/Ced steroids.   Pulmonary: VDRF with RUL PNA in a patient with history of sepsis.  Plan:  - Titrate O2 for sat of 88-92%.   - Abx as above.   Renal: Hypokalemia and hypomagnesemia noted and addressed, recheck in AM.  GI: No active issues, regular diet.   ID: RUL PNA, levofloxacin 5/8.   Endocrine: No diabetes.  Transfer to SDU.  Alyson Reedy, M.D. [facility] Pulmonary/Critical Care Medicine. Pager: [phone]. After hours pager: [phone].

## 2012-01-13 NOTE — Progress Notes (Signed)
Pt's daughter, Marcelino Duster has reported that the pt lost her upper dentures when she was intubated which was in the ambulance in route to the hospital's ED.  01/13/2012 6:37 PM Alivia Cimino, [place]

## 2012-01-13 NOTE — Progress Notes (Signed)
Clinical Social Worker received voicemail message on Sunday, 01/12/12, regarding pt's missing dentures.  CSW spoke with RN regarding issue.  RN is currently working with pt and dtr regarding missing dentures.  RN to phone security, if needed, regarding missing dentures.    Please reconsult CSW if additional needs arise.   Angelia Mould, MSW, [place] [phone]

## 2012-01-13 NOTE — Progress Notes (Signed)
Patient attempted to urinate at 2000 on 01-12-12 w/o any success. Bladder scanner showed approximately of urine. MD notified, no orders received at this time. Patient was encouraged to drink fluids, attempted to use badpan again at 2200 and again on 01-13-12 at 0015 w/o success. Bladder scanner showing approximately of urine. MD notified, order for foley catheter received. Foley catheter inserted using aseptic technique with 2 RNs at the bedside, patient tolerated well, immediate return of of urine noted.  Jorge Ny [place]

## 2012-01-13 NOTE — Evaluation (Signed)
Physical Therapy Evaluation Patient Details Name: Emily Harrington MRN: [id number] DOB: 12/04/1950 Today's Date: 01/13/2012  Problem List:  Patient Active Problem List  Diagnoses  . Septic shock  . Acute respiratory failure  . Cardiac arrest  . Pneumonia    Past Medical History:  Past Medical History  Diagnosis Date  . Asthma    Past Surgical History:  Past Surgical History  Procedure Date  . Cholecystectomy   . Abdominal hysterectomy     PT Assessment/Plan/Recommendation PT Assessment Clinical Impression Statement: Patient s/p admit with PNA and sepsis with resultant PEA arrest in the ED requiring CPR and VDRF.  Patient now weak and deconditioned and limited by pain.  Will benefit from PT to address endurance and balance issues.  Will most likely need NHP prior to d/c home as patient lived alone PTA.   PT Recommendation/Assessment: Patient will need skilled PT in the acute care venue PT Problem List: Decreased activity tolerance;Decreased balance;Decreased mobility;Decreased safety awareness;Decreased knowledge of precautions;Decreased knowledge of use of DME;Pain;Decreased coordination Barriers to Discharge: Decreased caregiver support PT Therapy Diagnosis : Generalized weakness PT Plan PT Frequency: Min 3X/week PT Treatment/Interventions: DME instruction;Gait training;Functional mobility training;Therapeutic activities;Therapeutic exercise;Balance training;Patient/family education PT Recommendation Follow Up Recommendations: Skilled nursing facility;Supervision/Assistance - 24 hour Equipment Recommended: Defer to next venue PT Goals  Acute Rehab PT Goals PT Goal Formulation: With patient Time For Goal Achievement: 2 weeks Pt will go Supine/Side to Sit: with min assist;with rail;with cues (comment type and amount) PT Goal: Supine/Side to Sit - Progress: Goal set today Pt will Sit at [facility] of Bed: with supervision;with no upper extremity support;6-10 min PT Goal: Sit at  Edge Of Bed - Progress: Goal set today Pt will go Sit to Stand: with min assist;with upper extremity assist PT Goal: Sit to Stand - Progress: Goal set today Pt will go Stand to Sit: with min assist;with upper extremity assist PT Goal: Stand to Sit - Progress: Goal set today Pt will Transfer Bed to Chair/Chair to Bed: with min assist PT Transfer Goal: Bed to Chair/Chair to Bed - Progress: Goal set today Pt will Stand: with min assist;with bilateral upper extremity support;3 - 5 min PT Goal: Stand - Progress: Goal set today Pt will Ambulate: 1 - 15 feet;with +2 total assist;with least restrictive assistive device;with cues (comment type and amount) (pt = 65%) PT Goal: Ambulate - Progress: Goal set today Pt will Perform Home Exercise Program: with min assist PT Goal: Perform Home Exercise Program - Progress: Goal set today  PT Evaluation Precautions/Restrictions  Precautions Precautions: Fall Precaution Comments: Sternum very sore secondary to chest compressions  Required Braces or Orthoses: No Restrictions Weight Bearing Restrictions: No Prior Functioning  Home Living Lives With: Alone Receives Help From: Family Type of Home: House Home Layout: One level Home Access: Stairs to enter Entrance Stairs-Rails: Right Entrance Stairs-Number of Steps: 3 Bathroom Shower/Tub: Engineer, manufacturing systems: Standard Home Adaptive Equipment: None Prior Function Level of Independence: Independent with basic ADLs;Independent with homemaking with ambulation Driving: Yes Vocation: Retired Financial risk analyst Arousal/Alertness: Awake/alert Overall Cognitive Status: Impaired History of Cognitive Impairment: Decline in baseline functioning Orientation Level: Oriented X4 Following Commands: Follows one step commands inconsistently;Follows multi-step commands inconsistently Safety/Judgement: Decreased awareness of safety precautions;Decreased safety judgement for tasks assessed Decreased  Safety/Judgement: Decreased awareness of need for assistance Awareness of Deficits: Decreased awareness of deficits Sensation/Coordination Sensation Light Touch: Appears Intact Stereognosis: Not tested Hot/Cold: Not tested Proprioception: Not tested Coordination Gross Motor Movements are Fluid and  Coordinated: No Fine Motor Movements are Fluid and Coordinated: No Extremity Assessment RUE Assessment RUE Assessment: Within Functional Limits LUE Assessment LUE Assessment: Within Functional Limits RLE Assessment RLE Assessment: Within Functional Limits LLE Assessment LLE Assessment: Within Functional Limits Mobility (including Balance) Bed Mobility Bed Mobility: Yes Sit to Supine: 1: +2 Total assist;Patient percentage (comment);HOB flat (pt = 30%) Sit to Supine - Details (indicate cue type and reason): Assisted almost fully due to pain Transfers Transfers: Yes Sit to Stand: 1: +2 Total assist;Patient percentage (comment);With upper extremity assist;With armrests;From chair/3-in-1 (pt = 40%) Sit to Stand Details (indicate cue type and reason): Patient had a difficult time with sit to stand from the low recliner and could not achieve even with assist of 2 staff members to RW.  Barely cleared bottom off of chair. Stand to Sit: 1: +2 Total assist;Patient percentage (comment);With upper extremity assist;With armrests;To chair/3-in-1 (pt = 20%) Squat Pivot Transfers: 1: +2 Total assist;Patient percentage (comment);With upper extremity assistance;With armrests (pt = 50%) Squat Pivot Transfer Details (indicate cue type and reason): Assisted with pad; squat pivot from recliner to bed with patient demonstrating ability to place weight onto bil LEs and pivot around to bed with 2 person assist and pad.   Ambulation/Gait Ambulation/Gait: No Stairs: No Wheelchair Mobility Wheelchair Mobility: No  Posture/Postural Control Posture/Postural Control: Postural limitations Postural Limitations: Poor  trunk control secondary to soreness in sternum after chest compressions.     End of Session PT - End of Session Equipment Utilized During Treatment: Gait belt Activity Tolerance: Patient limited by fatigue;Patient limited by pain Patient left: in bed;with call bell in reach Nurse Communication: Mobility status for transfers;Need for lift equipment General Behavior During Session: [facility] for tasks performed Cognition: Impaired  INGOLD,Lorieann Argueta 01/13/2012, 4:16 PM  [facility] Acute Rehabilitation [phone] (pager)

## 2012-01-14 DIAGNOSIS — R7881 Bacteremia: Secondary | ICD-10-CM

## 2012-01-14 DIAGNOSIS — J9600 Acute respiratory failure, unspecified whether with hypoxia or hypercapnia: Secondary | ICD-10-CM

## 2012-01-14 DIAGNOSIS — J13 Pneumonia due to Streptococcus pneumoniae: Secondary | ICD-10-CM

## 2012-01-14 DIAGNOSIS — A419 Sepsis, unspecified organism: Secondary | ICD-10-CM

## 2012-01-14 DIAGNOSIS — R6521 Severe sepsis with septic shock: Secondary | ICD-10-CM

## 2012-01-14 LAB — BASIC METABOLIC PANEL
BUN: 80 mg/dL — ABNORMAL HIGH (ref 6–23)
Calcium: 9 mg/dL (ref 8.4–10.5)
GFR calc non Af Amer: 13 mL/min — ABNORMAL LOW (ref >90)
Glucose, Bld: 69 mg/dL — ABNORMAL LOW (ref 70–99)
Potassium: 4.4 mEq/L (ref 3.5–5.1)

## 2012-01-14 LAB — CBC
HCT: 33.9 % — ABNORMAL LOW (ref 36.0–46.0)
Hemoglobin: 10.9 g/dL — ABNORMAL LOW (ref 12.0–15.0)
MCH: 28.5 pg (ref 26.0–34.0)
MCHC: 32.2 g/dL (ref 30.0–36.0)
MCV: 88.5 fL (ref 78.0–100.0)

## 2012-01-14 MED ORDER — FENTANYL CITRATE 0.05 MG/ML IJ SOLN
INTRAMUSCULAR | Status: AC
Start: 2012-01-14 — End: 2012-01-14
  Administered 2012-01-14: 50 ug
  Filled 2012-01-14: qty 2

## 2012-01-14 MED ORDER — BOOST / RESOURCE BREEZE PO LIQD
1.0000 | Freq: Two times a day (BID) | ORAL | Status: DC
  Administered 2012-01-14 – 2012-01-17 (×5): 1 via ORAL

## 2012-01-14 MED ORDER — FENTANYL CITRATE 0.05 MG/ML IJ SOLN
50.0000 ug | Freq: Once | INTRAMUSCULAR | Status: AC
  Administered 2012-01-18: 50 ug via INTRAVENOUS
  Filled 2012-01-14 (×2): qty 2

## 2012-01-14 MED FILL — Nutritional Supplement Liquid: ORAL | Qty: 1000 | Status: AC

## 2012-01-14 NOTE — Progress Notes (Signed)
Name: FRANCETTA ILG MRN: [id number] DOB: 1951/01/26    LOS: 6  PCCM FOLLOW UP NOTE  History of Present Illness: 61 year old female with unknown PMH who crawled from her room and lost consciousness.  EMS was called and patient was brought to the ED.  Enroute the patient decompensated and became combative and unable to protect airway.  Patient also dropped her pressure and was intubated enroute to the ED.  Lines / Drains: L [place] TLC 3/20>>> 3/24 R femoral a-line 3/20>>> 3/22 ET tube 3/20>> 3/23  Cultures: Blood 3/20 >> 1/2 pneumococcus Sputum 3/20>>> few E coli pan sensitive Urine 3/20>>> NEG cdiff  Neg 3/22  Antibiotics: Vancomycin 3/20>>> 3/23 Zosyn 3/20>>> 3/23 levofloxa 3/21 >>  Tests / Events: Cardiac arrest in the ED 3/20 3/20 ct abdo/pelvis- neg acute 3/20 Ct chest -Large area of airspace consolidation involving the right upper<BR>lobe most likely represents bacterial pneumonia. This will need<BR>interval radiographic followup to confirm resolution and to exclude<BR>an underlying neoplastic process which may have a similar<BR>appearance 3/20- EGDT 3/21- pos BC, off pressors  SUBJ: No distress. Complaints of sternal pain due to chest compression and now backpain.  Vital Signs: Filed Vitals:   01/14/12 1420  BP:   Pulse: 133  Temp:   Resp:     Intake/Output Summary (Last 24 hours) at 01/14/12 1526 Last data filed at 01/14/12 1300  Gross per 24 hour  Intake    770 ml  Output   1750 ml  Net   -980 ml   Physical Examination: General:  NAD Neuro:  Rno focal deficits HEENT:  jvd not visualized Neck:  Supple s LAN Cardiovascular:  RRR, Nl S1/S2, -M/R/G. Lungs:  RUL posterior rales Abdomen:  Soft, NT, ND and +BS. Musculoskeletal:  -edema  Skin:  Multiple scars.  Labs and Imaging:   No new labs or CXR  Assessment and Plan: 45 yead old female with PMH of asthma who presents to the hospital after being found SOB and unresponsive. Patient likely has a RUL  PNA vs mass. Patient dropped her pressure in the ED and had a cardiac arrest episode. Original rhythm was PEA, after CXR, infiltrate were noted, ?of [place], Echo ordered emergently and RV was normal and LV was active. This is likely a PNA with septic shock.   Neuro: Improved now that sedation is off.  Plan:  - Head CT noted.   - D/C sedation, PRN morphine.   CV: Shock, likely septic with a RUL PNA.  Resolved. Plan:  - Levofloxacin PO day 6/8.   - D/C pressors.  - D/Ced steroids.   Pulmonary: VDRF with RUL PNA in a patient with history of sepsis.  Plan:  - Titrate O2 for sat of 88-92%.   - Abx as above.   Renal: Hypokalemia and hypomagnesemia noted and addressed, recheck in AM.  GI: No active issues, regular diet.   ID: RUL PNA, levofloxacin 6/8.   Endocrine: No diabetes.  Clinical case management consult for SNF placement.  Transfer to [facility] in AM, PCCM will sign off, please call back if needed.  Alyson Reedy, M.D. [facility] Pulmonary/Critical Care Medicine. Pager: [phone]. After hours pager: [phone].

## 2012-01-14 NOTE — Progress Notes (Signed)
Nutrition Follow-up  Pt extubated 3/23. Enteral nutrition discontinued with extubation. Currently on Regular diet order. Pt with hypokalemia and hypomagnesemia 3/25. Currently consuming 25% of meals. When asked why she is not eating well, pt states that she needs to get her bowels regulated first. Breakfast tray untouched at this time.  Diet Order:  Regular  Meds: Scheduled Meds:   . antiseptic oral rinse  15 mL Mouth Rinse BID  . aspirin EC  162 mg Oral Daily  . enoxaparin (LOVENOX) injection  30 mg Subcutaneous Q24H  . levofloxacin  250 mg Oral Daily  . magnesium sulfate 1 - 4 g bolus IVPB  2 g Intravenous Once  . potassium chloride SA      . potassium chloride  40 mEq Oral TID  . DISCONTD: enoxaparin (LOVENOX) injection  40 mg Subcutaneous Q24H  . DISCONTD: potassium chloride  40 mEq Per Tube TID  . DISCONTD: potassium chloride  40 mEq Oral TID   Continuous Infusions:  PRN Meds:.acetaminophen (TYLENOL) oral liquid 160 mg/5 mL, albuterol, morphine injection, oxyCODONE-acetaminophen  Labs:  CMP     Component Value Date/Time   NA 138 01/14/2012 0410   K 4.4 01/14/2012 0410   CL 103 01/14/2012 0410   CO2 24 01/14/2012 0410   GLUCOSE 69* 01/14/2012 0410   BUN 80* 01/14/2012 0410   CREATININE 3.42* 01/14/2012 0410   CALCIUM 9.0 01/14/2012 0410   PROT 5.2* 01/11/2012 0340   ALBUMIN 1.7* 01/11/2012 0340   AST 103* 01/11/2012 0340   ALT 100* 01/11/2012 0340   ALKPHOS 190* 01/11/2012 0340   BILITOT 0.4 01/11/2012 0340   GFRNONAA 13* 01/14/2012 0410   GFRAA 15* 01/14/2012 0410  Phosphorus 4.5 WNL Magnesium 2.6H   CBG (last 3)   Basename 01/13/12 1133 01/13/12 1132  GLUCAP 62* 63*     Intake/Output Summary (Last 24 hours) at 01/14/12 0916 Last data filed at 01/14/12 0700  Gross per 24 hour  Intake    340 ml  Output   1450 ml  Net  -1110 ml   Ht: 5\' 5"  (165.1 cm)  Weight Status:  96.6 kg - wt up 6.9 kg since admission likely related to recent positive fluid balance BMI:  35.4  Re-estimated needs:   Kcal: 1700 - 1800 kcal Protein: 80 - 95 grams protein Fluid: 1.7 - 1.8 L/d  Previous Nutrition Dx:  Inadequate oral intake r/t inability to eat AEB NPO status. Discontinue. New Nutrition Dx: Inadequate oral intake r/t poor appetite AEB pt report.  Previous Goal:  Enteral nutrition to provide 60-70% of estimated calorie needs (22-25 kcals/kg ideal body weight) and 100% of estimated protein needs, based on ASPEN guidelines for permissive underfeeding in critically ill obese individuals. Discontinue. New Goal: Pt to consume >/= 90% of estimated needs. Unmet.  Intervention:   1. Resource Breeze PO BID between meals to provide additional protein and kcal. Encourage meals. 2. RD to continue to follow  Monitor:  Weights, labs, I/O's, renal function, PO intake  Adair Laundry Pager #:  [phone]

## 2012-01-15 ENCOUNTER — Inpatient Hospital Stay (HOSPITAL_COMMUNITY): Payer: Medicaid Other

## 2012-01-15 DIAGNOSIS — I10 Essential (primary) hypertension: Secondary | ICD-10-CM | POA: Diagnosis present

## 2012-01-15 DIAGNOSIS — J45901 Unspecified asthma with (acute) exacerbation: Secondary | ICD-10-CM | POA: Diagnosis present

## 2012-01-15 DIAGNOSIS — R7881 Bacteremia: Secondary | ICD-10-CM | POA: Diagnosis present

## 2012-01-15 DIAGNOSIS — J155 Pneumonia due to Escherichia coli: Secondary | ICD-10-CM | POA: Diagnosis present

## 2012-01-15 DIAGNOSIS — R0781 Pleurodynia: Secondary | ICD-10-CM | POA: Diagnosis present

## 2012-01-15 DIAGNOSIS — N170 Acute kidney failure with tubular necrosis: Secondary | ICD-10-CM | POA: Diagnosis present

## 2012-01-15 LAB — CBC
HCT: 34.1 % — ABNORMAL LOW (ref 36.0–46.0)
MCV: 89.3 fL (ref 78.0–100.0)
RDW: 16.2 % — ABNORMAL HIGH (ref 11.5–15.5)
WBC: 18.4 10*3/uL — ABNORMAL HIGH (ref 4.0–10.5)

## 2012-01-15 LAB — URINALYSIS, MICROSCOPIC ONLY
Bilirubin Urine: NEGATIVE
Glucose, UA: NEGATIVE mg/dL
Ketones, ur: NEGATIVE mg/dL
Protein, ur: 30 mg/dL — AB
pH: 5.5 (ref 5.0–8.0)

## 2012-01-15 LAB — BASIC METABOLIC PANEL
BUN: 70 mg/dL — ABNORMAL HIGH (ref 6–23)
CO2: 27 mEq/L (ref 19–32)
Chloride: 103 mEq/L (ref 96–112)
Creatinine, Ser: 3.43 mg/dL — ABNORMAL HIGH (ref 0.50–1.10)
GFR calc Af Amer: 15 mL/min — ABNORMAL LOW (ref >90)

## 2012-01-15 LAB — CULTURE, BLOOD (ROUTINE X 2): Culture: NO GROWTH

## 2012-01-15 MED ORDER — OXYCODONE HCL 5 MG PO TABS
5.0000 mg | ORAL_TABLET | ORAL | Status: DC | PRN
  Administered 2012-01-15: 10 mg
  Filled 2012-01-15 (×2): qty 2

## 2012-01-15 NOTE — Progress Notes (Signed)
[facility] [place] TEAM 1 - Stepdown/ICU TEAM  Subjective: Alert. Endorses shortness of breath related to pleuritic type chest pain. Has pain with respiratory effort and back as well as anterior chest her sternum.  Objective: Blood pressure 167/63, pulse 103, temperature 97.8 F (36.6 C), temperature source Oral, resp. rate 20, height 5\' 5"  (1.651 m), weight 96.6 kg (212 lb 15.4 oz), SpO2 99.00%.  Intake/Output from previous day: 03/26 0701 - 03/27 0700 In: 920 [P.O.:920] Out: 1475 [Urine:1475] Intake/Output this shift: Total I/O In: 240 [P.O.:240] Out: 250 [Urine:250]  General appearance: alert, cooperative, mild distress and moderately obese Resp: clear to auscultation bilaterally but distant lung sounds with poor air movement, no wheezing, 2 L per minute sats 98%, noted to have puffing-auto PEEP respiratory effort somewhat influenced by pain and anxiety level, tender over anterior chest wall directly over her sternum Cardio: regular rate and sinus tachycardia rhythm, S1, S2 normal, no murmur, click, rub or gallop, mildly hypertensive - HS distant GI: soft, non-tender; bowel sounds normal; no masses,  no organomegaly Extremities: extremities normal, atraumatic, no cyanosis or edema Neurologic: Grossly normal  Lab Results:  Basename 01/15/12 0355 01/14/12 0410  WBC 18.4* 14.1*  HGB 11.0* 10.9*  HCT 34.1* 33.9*  PLT 303 248   BMET  Basename 01/15/12 0355 01/14/12 0410  NA 139 138  K 4.1 4.4  CL 103 103  CO2 27 24  GLUCOSE 77 69*  BUN 70* 80*  CREATININE 3.43* 3.42*  CALCIUM 9.2 9.0   Medications:  I have reviewed the patient's current medications.  Assessment/Plan:  Acute respiratory failure with hypoxia *Required intubation and mechanical ventilation *Still requiring nasal cannula oxygen *poor respiratory mechanics at present - monitor in SDU  Right upper lobe pneumonia/ E. Coli in sputum / pneumococcus in blood *suspect the E. Coli is a  contaminant, or ? colonizer *CT of the chest shows a very dense right upper lobe pneumonia *Continue to follow response to current antibiotic therapy *If respiratory status worsens or white count climbs or develops fevers may need thoracic surgery evaluation for possible VATS procedure *Continue Levaquin. Completed course of Zosyn and vancomycin  Septic shock/ Cardiac arrest *Likely cardiac arrest related to profound hypoxemia given presentation with PEA rhythm *Procalcitonin was greater than 175 *Did require pressor support since was hemodynamically unstable with vital signs stable now and she is actually hypertensive  Acute renal failure with tubular necrosis *Is non-oliguric in nature and averaging 1400 cc of urine output every 24 hours *Located record from 2000 and patient's creatinine at that time was 0.8 *Current creatinine is creeping up slightly to 3.43 *Creatinine at presentation was 3.91 with brief nadir of 3.19 but over the past 48 hours has begun to increase *We'll check a renal ultrasound *We'll also check a urinalysis for protein *Suspect ATN from shock *If renal function does not rebound may benefit from nephrology consultation  Asthma exacerbation *May be contributing to some of the respiratory symptoms *Need to clarify if patient also smoked *Continue nebulizer therapy  Bacteremia due to Streptococcus pneumoniae * Continue Levaquin. Previously treated with Zosyn and vancomycin  Pleuritic chest pain *Continue Percocet and morphine *Because of acute renal failure cannot use NSAIDs or other anti-inflammatory drugs   HTN (hypertension) *Apparently not a diagnosis prior to admission *Pain seems to be contributing to high blood pressure so we'll continue to monitor for now *Renal failure also could be contributing   Disposition *Remain in stepdown   LOS: 7 days   Revonda Standard  Rennis Harding, ANP pager [phone]  [facility] [email or web address] Password:  [email or web address]  01/15/2012, 11:19 AM  I have personally examined this patient and reviewed the entire database. I have reviewed the above note, made any necessary editorial changes, and agree with its content.  Lonia Blood, MD [facility]

## 2012-01-15 NOTE — Progress Notes (Signed)
Name: Emily Harrington MRN: [id number] DOB: [birth year]    LOS: 7  PCCM FOLLOW UP NOTE  History of Present Illness: 61 year old female with unknown PMH who crawled from her room and lost consciousness.  EMS was called and patient was brought to the ED.  Enroute the patient decompensated and became combative and unable to protect airway.  Patient also dropped her pressure and was intubated enroute to the ED.  Lines / Drains: L [place] TLC 3/20>>> 3/24 R femoral a-line 3/20>>> 3/22 ET tube 3/20>> 3/23  Cultures: Blood 3/20 >> 1/2 pneumococcus Sputum 3/20>>> few E coli pan sensitive Urine 3/20>>> NEG cdiff  Neg 3/22  Antibiotics: Vancomycin 3/20>>> 3/23 Zosyn 3/20>>> 3/23 levofloxa 3/21 >>  Tests / Events: Cardiac arrest in the ED 3/20 3/20 ct abdo/pelvis- neg acute 3/20 Ct chest -Large area of airspace consolidation involving the right upper<BR>lobe most likely represents bacterial pneumonia. This will need<BR>interval radiographic followup to confirm resolution and to exclude<BR>an underlying neoplastic process which may have a similar<BR>appearance 3/20- EGDT 3/21- pos BC, off pressors  SUBJ: No distress. Complaints of sternal pain due to chest compression and now backpain.  Vital Signs: Filed Vitals:   01/15/12 1200  BP: 163/63  Pulse: 113  Temp: 98.5 F (36.9 C)  Resp: 21    Intake/Output Summary (Last 24 hours) at 01/15/12 1259 Last data filed at 01/15/12 1200  Gross per 24 hour  Intake   1160 ml  Output   1725 ml  Net   -565 ml   Physical Examination: General:  NAD Neuro:  Rno focal deficits HEENT:  jvd not visualized Neck:  Supple s LAN Cardiovascular:  RRR, Nl S1/S2, -M/R/G. Lungs:  RUL posterior rales Abdomen:  Soft, NT, ND and +BS. Musculoskeletal:  -edema  Skin:  Multiple scars.  Labs and Imaging:   No new labs or CXR  Assessment and Plan: 61 yead old female with PMH of asthma who presents to the hospital after being found SOB and unresponsive.  Patient likely has a RUL PNA vs mass. Patient dropped her pressure in the ED and had a cardiac arrest episode. Original rhythm was PEA, after CXR, infiltrate were noted, ?of [place], Echo ordered emergently and RV was normal and LV was active. This is likely a PNA with septic shock.   Neuro: Improved now that sedation is off.  Plan:  - Head CT noted.   - D/C sedation, PRN morphine.   CV: Shock, likely septic with a RUL PNA.  Resolved. Plan:  - Levofloxacin PO day 7/8.   - D/C pressors.  - D/Ced steroids.   Pulmonary: VDRF with RUL PNA in a patient with history of sepsis.  Plan:  - Titrate O2 for sat of 88-92%.   - Abx as above.   Renal: Hypokalemia and hypomagnesemia noted and addressed, recheck in AM.  GI: No active issues, regular diet.   ID: RUL PNA, levofloxacin 7/8.   Endocrine: No diabetes.  Clinical case management consult for SNF placement.  [facility] picked up patient, PCCM signing off, please call back if needed.  Alyson Reedy, M.D. [facility] Pulmonary/Critical Care Medicine. Pager: [phone]. After hours pager: [phone].

## 2012-01-15 NOTE — Progress Notes (Addendum)
Physical Therapy Treatment Patient Details Name: Emily Harrington MRN: [id number] DOB: 08/04/1951 Today's Date: 01/15/2012  PT Assessment/Plan  PT - Assessment/Plan Comments on Treatment Session: Pt continues to be limited by pain in sternum and generalized weakness.  Pt's blood pressure 174/134 at end of session.  RN notified.  Acute PT will continue to follow pt.   PT Plan: Discharge plan remains appropriate;Frequency remains appropriate PT Frequency: Min 3X/week Follow Up Recommendations: Skilled nursing facility;Supervision/Assistance - 24 hour Equipment Recommended: Defer to next venue PT Goals  Acute Rehab PT Goals PT Goal Formulation: With patient Time For Goal Achievement: 2 weeks Pt will go Supine/Side to Sit: with min assist;with rail;with cues (comment type and amount) PT Goal: Supine/Side to Sit - Progress: Not met Pt will Sit at [facility] of Bed: with supervision;with no upper extremity support;6-10 min PT Goal: Sit at Edge Of Bed - Progress: Not met Pt will go Sit to Stand: with min assist;with upper extremity assist PT Goal: Sit to Stand - Progress: Progressing toward goal Pt will go Stand to Sit: with min assist;with upper extremity assist PT Goal: Stand to Sit - Progress: Progressing toward goal Pt will Transfer Bed to Chair/Chair to Bed: with min assist PT Transfer Goal: Bed to Chair/Chair to Bed - Progress: Not met Pt will Stand: with min assist;with bilateral upper extremity support;3 - 5 min PT Goal: Stand - Progress: Not met Pt will Ambulate: 1 - 15 feet;with +2 total assist;with least restrictive assistive device;with cues (comment type and amount) PT Goal: Ambulate - Progress: Progressing toward goal Pt will Perform Home Exercise Program: with min assist PT Goal: Perform Home Exercise Program - Progress: Not met  PT Treatment Precautions/Restrictions  Precautions Precautions: Fall Precaution Comments: Sternum very sore secondary to chest compressions    Required Braces or Orthoses: No Restrictions Weight Bearing Restrictions: No Mobility (including Balance) Bed Mobility Bed Mobility: No Transfers Transfers: Yes Sit to Stand: 1: +2 Total assist;Patient percentage (comment);With upper extremity assist;With armrests;From chair/3-in-1 Sit to Stand Details (indicate cue type and reason): Pt required total assist to stand due to pain in sternum with WB on UEs.  Peformed 3 times for practice and increased activity tolerance.   Stand to Sit: 2: Max assist;To chair/3-in-1;With upper extremity assist (Pt 40%) Stand to Sit Details: Assist to control descent to chair.  Cues for hand placement first trial.  Pt able to recall hand placement nest two trials.   Squat Pivot Transfers: Not tested (comment) Ambulation/Gait Ambulation/Gait: Yes Ambulation/Gait Assistance: 1: +2 Total assist Ambulation/Gait Assistance Details (indicate cue type and reason): Repeated verbal and tactile cues for upright trunk posture.  Pt bent over at the waist throughout.  Assist to stabilize pt due to bilateral LE weakness.  Pt reports legs feel rubbery. Pt c/o worsening strernal pain with WB on UEs.  Pt resp. rate increased and O2 sats decreased to <80% on 3L o2 via [place].    Ambulation Distance (Feet): 5 Feet Assistive device: Rolling walker Gait Pattern: Step-to pattern Gait velocity: Very slow Stairs: No Wheelchair Mobility Wheelchair Mobility: No  Posture/Postural Control Posture/Postural Control: Postural limitations Postural Limitations: Pt present with flexed forward trunk posture and Rt lateral trunk bend in standing due to pain in chest.   Repeated verbal and tactile cues for upright posture, pt unable to maintain proper posture for more than a few seconds.  Attempted to provide visual cue instructing pt to look at calender on the wall. Pt still unable to maintain trunk and  cervical extension.  Balance Balance Assessed: Yes Static Sitting Balance Static Sitting -  Balance Support: No upper extremity supported;Feet supported Static Sitting - Level of Assistance: 4: Min assist Static Sitting - Comment/# of Minutes: several minutes sitting on edge of chair with no back support.  Pt leaning to her right and forward.   Exercise    End of Session PT - End of Session Equipment Utilized During Treatment: Gait belt Activity Tolerance: Patient limited by fatigue;Patient limited by pain Patient left: in chair;with call bell in reach Nurse Communication: Mobility status for transfers;Need for lift equipment General Behavior During Session: [facility] for tasks performed Cognition: [facility] for tasks performed  Aurel Nguyen 01/15/2012, 12:44 PM Travonta Gill L. Diana Davenport DPT [phone]

## 2012-01-15 NOTE — Progress Notes (Addendum)
CSW reconsulted for snf placement. CSW spoke with pt and pt daughter by phone regarding patient discharge plans. Pt daughter agreed that patient would benefit from 24 hour supervision with therapy. CSW and Pt daughter discussed pt lack of resources and pt insurance. Pt daughter shared she completed a medicaid, and disability application for pt this morning. CSW to follow up in regards to Letter of [place] and if bed available in [place] [facility] [employer].  Please see placement note below regarding snf placement search.  .Clinical social worker continuing to follow pt to assist with pt dc plans and further csw needs.   Catha Gosselin, Theresia Majors  [phone] .01/15/2012 1729   Clinical Social Work Department CLINICAL SOCIAL WORK PLACEMENT NOTE 01/15/2012  Patient:  JANESSA, MICKLE  Account Number:  [id number] Admit date:  01/08/2012  Clinical Social Worker:  Doree Albee  Date/time:  01/15/2012 04:00 PM  Clinical Social Work is seeking post-discharge placement for this patient at the following level of care:   SKILLED NURSING   (*CSW will update this form in Epic as items are completed)   01/15/2012  Patient/family provided with [facility] Department of Clinical Social Work's list of facilities offering this level of care within the geographic area requested by the patient (or if unable, by the patient's family).  01/15/2012  Patient/family informed of their freedom to choose among providers that offer the needed level of care, that participate in Medicare, Medicaid or managed care program needed by the patient, have an available bed and are willing to accept the patient.  01/15/2012  Patient/family informed of [facility]' ownership interest in [facility], as well as of the fact that they are under no obligation to receive care at this facility.  PASARR submitted to EDS on 01/15/2012 PASARR number received from EDS on   FL2 transmitted to all facilities in  geographic area requested by pt/family on  01/15/2012 FL2 transmitted to all facilities within larger geographic area on   Patient informed that his/her managed care company has contracts with or will negotiate with  certain facilities, including the following:     Patient/family informed of bed offers received:   Patient chooses bed at  Physician recommends and patient chooses bed at    Patient to be transferred to  on   Patient to be transferred to facility by   The following physician request were entered in Epic:   Additional Comments:

## 2012-01-16 ENCOUNTER — Inpatient Hospital Stay (HOSPITAL_COMMUNITY): Payer: Medicaid Other

## 2012-01-16 DIAGNOSIS — J852 Abscess of lung without pneumonia: Secondary | ICD-10-CM

## 2012-01-16 LAB — CBC
Hemoglobin: 10.8 g/dL — ABNORMAL LOW (ref 12.0–15.0)
MCH: 28.1 pg (ref 26.0–34.0)
Platelets: 375 10*3/uL (ref 150–400)
RBC: 3.85 MIL/uL — ABNORMAL LOW (ref 3.87–5.11)
WBC: 23.4 10*3/uL — ABNORMAL HIGH (ref 4.0–10.5)

## 2012-01-16 LAB — BASIC METABOLIC PANEL
CO2: 26 mEq/L (ref 19–32)
Calcium: 9.4 mg/dL (ref 8.4–10.5)
Chloride: 102 mEq/L (ref 96–112)
Glucose, Bld: 93 mg/dL (ref 70–99)
Potassium: 3.7 mEq/L (ref 3.5–5.1)
Sodium: 139 mEq/L (ref 135–145)

## 2012-01-16 MED ORDER — DEXTROSE-NACL 5-0.45 % IV SOLN
INTRAVENOUS | Status: DC
  Administered 2012-01-16 – 2012-01-18 (×3): via INTRAVENOUS
  Administered 2012-01-18: 50 mL/h via INTRAVENOUS
  Administered 2012-01-19 – 2012-01-23 (×3): via INTRAVENOUS
  Administered 2012-01-24: 50 mL/h via INTRAVENOUS

## 2012-01-16 MED ORDER — MORPHINE SULFATE 2 MG/ML IJ SOLN
2.0000 mg | INTRAMUSCULAR | Status: DC | PRN
  Administered 2012-01-16 – 2012-01-18 (×5): 2 mg via INTRAVENOUS
  Filled 2012-01-16 (×5): qty 1

## 2012-01-16 NOTE — Patient Instructions (Signed)
Informed PT how to use Flutter valve. PT understands instructions and demonstrated 10x. RT will continue to monitor.

## 2012-01-16 NOTE — Consult Note (Signed)
Agree with above assessment. No evidence of lung abscess or empyema on last CT Blood cultures pos for strep pneumonia at presentation now neg Would not rec lobectomy for pneumonia but continue antibiotics and consider broadening coverage. Consider repeat CT w/o contrast. Would bronchoscope patient if she is reintubated.  I

## 2012-01-16 NOTE — Progress Notes (Signed)
[facility] [place] TEAM 1 - Stepdown/ICU TEAM  Subjective: Endorses increased right pleuritic chest pain in her back. The pain is severe enough that she cannot sit forward in the bed and even with assistance with rolling over to the left side the pain is very severe. Continues with shortness of breath and is observed having continued issues with pursed lip breathing.  Objective: Blood pressure 179/81, pulse 120, temperature 98.3 F (36.8 C), temperature source Oral, resp. rate 26, height 5\' 5"  (1.651 m), weight 96.9 kg (213 lb 10 oz), SpO2 89.00%.  Intake/Output from previous day: 03/27 0701 - 03/28 0700 In: 1010 [P.O.:1010] Out: 1450 [Urine:1450] Intake/Output this shift: Total I/O In: -  Out: 700 [Urine:700]  General appearance: alert, cooperative, mild distress and moderately obese Resp: Pulmonary exam has changed in the past 24 hours-she now has right upper lobe crackles with quite decreased sounds in the mid field down on the right, no wheezing. Left lung slightly decreased in the base but no wheezing or crackles. On 2 L per minute sats 98%, noted to have puffing-auto PEEP respiratory effort somewhat influenced by pain and anxiety level, tender over anterior chest wall directly over her sternum Cardio: regular rate and sinus tachycardia rhythm with rates in the 120s and occasionally up to 140s, S1, S2 normal, no murmur, click, rub or gallop, mildly hypertensive - HS distant GI: soft, non-tender; bowel sounds normal; no masses,  no organomegaly Extremities: extremities normal, atraumatic, no cyanosis or edema Neurologic: Grossly normal  Lab Results:  Basename 01/16/12 0405 01/15/12 0355  WBC 23.4* 18.4*  HGB 10.8* 11.0*  HCT 34.3* 34.1*  PLT 375 303   BMET  Basename 01/16/12 0405 01/15/12 0355  NA 139 139  K 3.7 4.1  CL 102 103  CO2 26 27  GLUCOSE 93 77  BUN 58* 70*  CREATININE 3.31* 3.43*  CALCIUM 9.4 9.2   Medications:  I have reviewed the patient's  current medications.  Assessment/Plan:  Acute respiratory failure with hypoxia *Required intubation and mechanical ventilation *Still requiring nasal cannula oxygen *poor respiratory mechanics at present - monitor in SDU  Right upper lobe pneumonia/ E. Coli in sputum / pneumococcus in blood *suspect the E. Coli is a contaminant, or ? colonizer *CT of the chest shows a very dense right upper lobe pneumonia *Continue to follow response to current antibiotic therapy *Since her respiratory status has worsened and leukocytosis has progressed we will consult thoracic surgery for evaluation for VATS procedure *Continue Levaquin. Completed course of Zosyn and vancomycin  Septic shock/ Cardiac arrest *Likely cardiac arrest related to profound hypoxemia given presentation with PEA rhythm *Procalcitonin was greater than 175 *Did require pressor support since was hemodynamically unstable with vital signs stable now and she is actually hypertensive  Acute renal failure with tubular necrosis *Is non-oliguric in nature and averaging 1400 cc of urine output every 24 hours *Located record from 2000 and patient's creatinine at that time was 0.8 *BUN and creatinine on the only slow downward trend *Creatinine at presentation was 3.91  *Renal ultrasound done revealing except for a bladder stone which is nonobstructing *Urinalysis is consistent with ATN *If renal function does not rebound may benefit from nephrology consultation  Asthma exacerbation *May be contributing to some of the respiratory symptoms *Need to clarify if patient also smoked *Continue nebulizer therapy  Bacteremia due to Streptococcus pneumoniae * Continue Levaquin. Previously treated with Zosyn and vancomycin  Pleuritic chest pain *Continue Percocet and morphine but we will increase frequency  of morphine from every 4 hours to every 2 hours *Because of acute renal failure cannot use NSAIDs or other anti-inflammatory drugs    HTN (hypertension) *Apparently not a diagnosis prior to admission *Pain seems to be contributing to high blood pressure so we'll continue to monitor for now *Renal failure also could be contributing   Disposition *Remain in stepdown *Daughter Marcelino Duster [phone] contacted and updated on patient's status and plans for thoracic surgery evaluation.   LOS: 8 days   Junious Silk, ANP pager [phone]  [facility] [email or web address] Password: [email or web address]  01/16/2012, 12:04 PM   I have examined the patient and reviewed the chart. I agree with the above note.   Calvert Cantor, MD [phone]

## 2012-01-16 NOTE — Consult Note (Signed)
PCP is No primary provider on file. Referring Provider is No ref. provider found  Chief Complaint  Patient presents with  . Altered Mental Status    History of Presenting Illness: This is a 61 year old Caucasian female, who according to medical records, was found by EMS lying on the living room floor at her home. She was found incontinent of urine and stool and was found to have a glucose of 45. While en route to [facility] emergency room, she began to decompensate, was combative, and developed agonal breathing.  She required intubation. She also became hypotensive and was given a saline bolus. After her arrival to the emergency room, she did cardiac arrest. The initial  rhythm was PEA. She was successfully resuscitated. Chest x-ray done showed a large mass within the right upper lobe (this measures 6.4 x 11.1 cm) and the left lung was clear. An echocardiogram was then done.Results showed: The left ventricle: The cavity size was normal. Wall thickness was increased in a pattern of mild LVH. Systolic function was normal. The estimated ejection fraction was in the range of 55% to 60%. Atrial septum: No defect or patent foramen ovale was identified. Pericardium, extracardiac: ? prominant epicardial fat pad vs small pericardial effusion.      The patient was experiencing septic shock secondary to right upper lobe pneumonia. She was started on Vancomycin and Zosyn.Sputum, urine, and blood cultures were obtained. Urine culture was negative. Blood cultures are positive for streptococcus Pneumonemiae.  Respiratory cultures were positive for Escherichia coli (possible contaminant versus colonization). Her white blood cell count was found to be 26,000 and her procalcitonin 175. Based on sensitivities, her vancomycin and Zosyn was later discontinued and she was placed on Levaquin. In addition, her creatinine was elevated at 3.91 and her transaminases were also elevated  (AST 261, ALT 120, alkaline phosphatase  135).      A CT of the head, chest, abdomen and pelvis were then performed. CT of the head was essentially negative. CT of the chest showed a dense wedge-shaped area of airspace consolidation containing air bronchograms in the right upper lobe) 11.2 x 7.5 cm, no significant adenopathy, large right renal pelvis stone, him a prior cholecystectomy. There were no acute findings within the abdomen or pelvis. Incidental avascular necrosis left femoral head and large right renal pelvis stone again noted. Was extubated on 01/11/2012. She has been requiring a couple liters of oxygen via nasal cannula since then. Her white blood cell count began to decrease. It went as low as 13,800 and she did remain afebrile; however, in the last couple days, it has begun to increase again and is now up to 23,400. She did have a Tmax of 99.5 earlier today. A thoracic consultation was obtained with Dr. Zenaida Niece to right for the consideration of right VATS.   Past Medical History  Diagnosis Date  . Asthma     Past Surgical History  Procedure Date  . Cholecystectomy   . Abdominal hysterectomy     History reviewed. No pertinent family history.  Social History History  Substance Use Topics  . Smoking status: Patient smoked 1/2 pack per day for many years, but quit January 1999.  Marland Kitchen Smokeless tobacco: Not on file  . Alcohol Use: No    Current Facility-Administered Medications  Medication Dose Route Frequency Provider Last Rate Last Dose  . acetaminophen (TYLENOL) solution 650 mg  650 mg Per Tube Q6H PRN Alyson Reedy, MD   650 mg at 01/12/12 1108  .  albuterol (PROVENTIL) (5 MG/ML) 0.5% nebulizer solution 2.5 mg  2.5 mg Nebulization Q4H PRN Merwyn Katos, MD   2.5 mg at 01/16/12 0442  . antiseptic oral rinse (BIOTENE) solution 15 mL  15 mL Mouth Rinse BID Alyson Reedy, MD   15 mL at 01/16/12 0747  . aspirin EC tablet 162 mg  162 mg Oral Daily Merwyn Katos, MD   162 mg at 01/16/12 1610  . dextrose 5 %-0.45 % sodium  chloride infusion   Intravenous Continuous Russella Dar, NP 50 mL/hr at 01/16/12 1001    . enoxaparin (LOVENOX) injection 30 mg  30 mg Subcutaneous Q24H Alyson Reedy, MD   30 mg at 01/15/12 1228  . feeding supplement (RESOURCE BREEZE) liquid 1 Container  1 Container Oral BID BM Haynes Bast, RD   1 Container at 01/15/12 1425  . fentaNYL (SUBLIMAZE) injection 50 mcg  50 mcg Intravenous Once Alyson Reedy, MD      . levofloxacin [facility]) tablet 250 mg  250 mg Oral Daily Alyson Reedy, MD   250 mg at 01/16/12 9604  . morphine 2 MG/ML injection 2 mg  2 mg Intravenous Q2H PRN Russella Dar, NP      . oxyCODONE (Oxy IR/ROXICODONE) immediate release tablet 5-10 mg  5-10 mg Per Tube Q4H PRN Lonia Blood, MD   10 mg at 01/15/12 2244  . DISCONTD: morphine 2 MG/ML injection 2 mg  2 mg Intravenous Q4H PRN Lonia Farber, MD   2 mg at 01/16/12 0843  . DISCONTD: oxyCODONE-acetaminophen (PERCOCET) 5-325 MG per tablet 1-2 tablet  1-2 tablet Oral Q4H PRN Alyson Reedy, MD   2 tablet at 01/15/12 1632    Allergies  Allergen Reactions  . Nsaids     Review of Systems:  Cardiac Review of Systems: Y or N  Chest Pain [n ] Resting SOB [place].Etienne ] Exertional SOB [place].Etienne ] Posterior right sided pleuritc pain y General Review of Systems: [Y] = yes [ ] =no  Constitional: recent weight change [ n ]; anorexia [ n ]; fatigue [ y ]; nausea [ n]; night sweats [n ]; fever [ n]; or chills [ n];  Eye : blurred vision [n ]; diplopia [ n]; vision changes [n ]; Amaurosis fugax[n ];  Resp: cough [ n ]; wheezing[ n ]; hemoptysis[n ]; shortness of breath[ y ]; ; dyspnea on exertion[y ]  GI: gallstones[ n], vomiting[n ]; dysphagia[n ]; melena[n ]; hematochezia [ n]; heartburn[ n];  GU: right renal stone [ y]; hematuria[n]; dysuria [n]; nocturia[n];  Skin: swelling[ n]; peripheral edema[n ]; or itching[n ];  Musculosketetal: myalgias[ n ]; joint swelling[ n ]; joint erythema[ n ];  joint pain[n ]; right posterior  back pain[ y];  Heme/Lymph: bruising[n ]; bleeding[n  Neuro: TIA[n ]; headaches[n ]; stroke[n ]; vertigo[n ]; seizures[ n ]; paresthesias[ n];  Psych:depression[ n ]; anxiety[ n ];  Endocrine: diabetes[n ]; thyroid dysfunction[n ];    BP 155/72  Pulse 117  Temp(Src) 98.4 F (36.9 C) (Oral)  Resp 20  Ht 5\' 5"  (1.651 m)  Wt 213 lb 10 oz (96.9 kg)  BMI 35.55 kg/m2  SpO2 94% on 2 L via [employer]  Physical Exam: General appearance: alert, cooperative,  and moderately obese  HEENT: Head is atraumatic and normocephalic. Eyes: Extraocular muscles are intact and pupils are equal round reactive to light and accommodation. Neck: Supple, no lymphadenopathy Pulmonary:Some crackles at base and decreased breath sounds at right  apex area. Left lung slightly decreased at the base but no rales,wheezin,g or crackles. Appears to be pursed lip breathing. Cardiovascular: regular rate and sinus tachycardia rhythm with rates in the 120s and occasionally up to 140's;  no murmur, click, rub or gallop Abdomen: soft, non-tender; bowel sounds normal; no masses, no organomegaly  Extremities: SCDs in place;feet warm Neurologic: Cranial nerves II through XII are grossly intact and focal deficits   Diagnostic Tests:CXR done 01/08/2012: *RADIOLOGY REPORT*  Clinical Data: Altered mental status, central line placement  PORTABLE CHEST - 1 VIEW  Comparison: None.  Findings: The tip of the endotracheal tube is approximately 3.9 cm  above the carina. A left subclavian catheter has been placed with  tip in the lower SVC. No pneumothorax is seen. There is a large  mass within the right upper lung field abutting the pleura most  consistent with primary lung carcinoma. This mass measures 6.4 x  11.1 cm. The left lung is clear.  IMPRESSION:  1. Large mass within the right upper lung field most consistent  with primary lung carcinoma.  2. Left subclavian catheter tip in lower SVC. No pneumothorax.  3. Endotracheal tube tip  approximately 3.9 cm above the carina.  Original Report Authenticated By: Juline Patch, M.D.  RADIOLOGY REPORT*  Clinical Data: Abnormal chest radiograph. Evaluate mass.  CT CHEST WITH CONTRAST,CT ABDOMEN AND PELVIS WITH CONTRAST  Technique: Multidetector CT imaging of the chest was performed  following the standard protocol during bolus administration of  intravenous contrast.,Technique: Multidetector CT imaging of the  abdomen and pelvis was performed following the standard protoc  Contrast: 80mL OMNIPAQUE IOHEXOL 300 MG/ML IJ SOLN  Comparison: 01/16/2012  Findings: No enlarged axillary or supraclavicular lymph nodes.  Left subclavian catheter is identified with tip in the distal SVC.  ET tube tip is above the carina.  No mediastinal or hilar adenopathy identified.  No pericardial or pleural effusion identified.  Dense wedge-shaped area of airspace consolidation containing air  bronchograms identified within the right upper lobe. This measures  11.2 x 7.5 cm, image 19.  Review of the visualized osseous structures demonstrates no  aggressive bone lesions.  IMPRESSION:  1. Large area of airspace consolidation involving the right upper  lobe most likely represents bacterial pneumonia. This will need  interval radiographic followup to confirm resolution and to exclude  an underlying neoplastic process which may have a similar  appearance  2. No significant adenopathy.  3. Large right renal pelvis stone.  4. Prior cholecystectomy.   Latest lab studies:  WBC 23,400, H/H 10.8 and 34.3, platelets 375,000 Na 139, K 3.7, Cl 102, CO2 26, Bun/Cr 58 and 3.31  Impression and Plan: 1.Acute respiratory failure with hypoxia-extubated 01/11/2012. 2.Septic shock secondary to RUL pneumonia. 3.Blood cultures positive for E. Coli (colonization vs. Contaminant) and Streptococcus Pneumoniae. Treated initially with Vancomycin and Zosyn. On Levaquin since 01/13/2012. 4.Acute renal failure-creatinine  down to 3.31. 5.Dr. Donata Clay to evaluate to determine whether or not a right VATS will be required.

## 2012-01-16 NOTE — Progress Notes (Signed)
Nutrition Follow-up  Pt states her appetite remains poor. Very short of breath upon RD entry. Observed 0% consumed on pt's Regular diet breakfast tray (prior to NPO status). Taking sips of Resource Breeze supplements.  Diet Order:  NPO  Meds: Scheduled Meds:   . antiseptic oral rinse  15 mL Mouth Rinse BID  . aspirin EC  162 mg Oral Daily  . enoxaparin (LOVENOX) injection  30 mg Subcutaneous Q24H  . feeding supplement  1 Container Oral BID BM  . fentaNYL  50 mcg Intravenous Once  . levofloxacin  250 mg Oral Daily   Continuous Infusions:   . dextrose 5 % and 0.45% NaCl 50 mL/hr at 01/16/12 1001   PRN Meds:.acetaminophen (TYLENOL) oral liquid 160 mg/5 mL, albuterol, morphine injection, oxyCODONE, DISCONTD:  morphine injection, DISCONTD: oxyCODONE-acetaminophen  Labs:  CMP     Component Value Date/Time   NA 139 01/16/2012 0405   K 3.7 01/16/2012 0405   CL 102 01/16/2012 0405   CO2 26 01/16/2012 0405   GLUCOSE 93 01/16/2012 0405   BUN 58* 01/16/2012 0405   CREATININE 3.31* 01/16/2012 0405   CALCIUM 9.4 01/16/2012 0405   PROT 5.2* 01/11/2012 0340   ALBUMIN 1.7* 01/11/2012 0340   AST 103* 01/11/2012 0340   ALT 100* 01/11/2012 0340   ALKPHOS 190* 01/11/2012 0340   BILITOT 0.4 01/11/2012 0340   GFRNONAA 14* 01/16/2012 0405   GFRAA 16* 01/16/2012 0405     Intake/Output Summary (Last 24 hours) at 01/16/12 1251 Last data filed at 01/16/12 1610  Gross per 24 hour  Intake    530 ml  Output   1600 ml  Net  -1070 ml    Weight Status:  96.9 kg (3/28) -- stable  Re-estimated needs:  1700-1800 kcals, 80-95 gm protein  Nutrition Dx:  Inadequate Oral Intake, ongoing  Goal:  Oral intake to meet >/= 90% of estimated nutrition needs, unmet Monitor: PO intake, weight, labs, I/O's  Intervention:    Editor, commissioning supplement   Recommend short-term nutrition support initiation -- if started, recommend Jevity 1.2 formula -- initiate at 20 ml/hr and increase 10 ml every 4 hours to goal  rate of 60 ml/hr to provide 1728 kcals, 80 gm protein, 1163 ml of free water  RD to follow for nutrition care plan  Alger Memos Pager #:  [phone]

## 2012-01-17 ENCOUNTER — Inpatient Hospital Stay (HOSPITAL_COMMUNITY): Payer: Medicaid Other

## 2012-01-17 DIAGNOSIS — S2249XA Multiple fractures of ribs, unspecified side, initial encounter for closed fracture: Secondary | ICD-10-CM | POA: Diagnosis present

## 2012-01-17 DIAGNOSIS — S2220XA Unspecified fracture of sternum, initial encounter for closed fracture: Secondary | ICD-10-CM | POA: Diagnosis present

## 2012-01-17 DIAGNOSIS — R079 Chest pain, unspecified: Secondary | ICD-10-CM

## 2012-01-17 DIAGNOSIS — M87052 Idiopathic aseptic necrosis of left femur: Secondary | ICD-10-CM | POA: Diagnosis present

## 2012-01-17 LAB — CBC
HCT: 35.1 % — ABNORMAL LOW (ref 36.0–46.0)
Hemoglobin: 11.3 g/dL — ABNORMAL LOW (ref 12.0–15.0)
MCH: 28.2 pg (ref 26.0–34.0)
MCV: 87.5 fL (ref 78.0–100.0)
Platelets: 418 10*3/uL — ABNORMAL HIGH (ref 150–400)
RBC: 4.01 MIL/uL (ref 3.87–5.11)
WBC: 23.5 10*3/uL — ABNORMAL HIGH (ref 4.0–10.5)

## 2012-01-17 LAB — COMPREHENSIVE METABOLIC PANEL
AST: 30 U/L (ref 0–37)
CO2: 26 mEq/L (ref 19–32)
Calcium: 9.3 mg/dL (ref 8.4–10.5)
Chloride: 102 mEq/L (ref 96–112)
Creatinine, Ser: 2.75 mg/dL — ABNORMAL HIGH (ref 0.50–1.10)
GFR calc Af Amer: 20 mL/min — ABNORMAL LOW (ref >90)
GFR calc non Af Amer: 17 mL/min — ABNORMAL LOW (ref >90)
Glucose, Bld: 133 mg/dL — ABNORMAL HIGH (ref 70–99)
Total Bilirubin: 0.2 mg/dL — ABNORMAL LOW (ref 0.3–1.2)

## 2012-01-17 LAB — PROTIME-INR: INR: 1.12 (ref 0.00–1.49)

## 2012-01-17 MED ORDER — OXYCODONE HCL 5 MG PO TABS: 5.0000 mg | ORAL_TABLET | ORAL | Status: DC

## 2012-01-17 MED ORDER — ALPRAZOLAM 0.25 MG PO TABS
0.2500 mg | ORAL_TABLET | Freq: Four times a day (QID) | ORAL | Status: DC | PRN
  Administered 2012-01-17: 0.25 mg via ORAL
  Filled 2012-01-17: qty 1

## 2012-01-17 MED ORDER — OXYCODONE HCL 5 MG PO TABS
5.0000 mg | ORAL_TABLET | ORAL | Status: DC
  Administered 2012-01-17 – 2012-01-18 (×3): 5 mg via ORAL
  Filled 2012-01-17 (×3): qty 1

## 2012-01-17 MED ORDER — OXYCODONE HCL 5 MG PO TABS: 5.0000 mg | ORAL_TABLET | ORAL | Status: DC | PRN

## 2012-01-17 NOTE — Progress Notes (Addendum)
[facility] [place] TEAM 1 - Stepdown/ICU TEAM  Subjective: Chest wall pain seems somewhat better controlled today. Patient having difficulty ingesting protein source. Does not normally drink milk-based products says causes increased mucus and worsens her asthma.   Objective: Blood pressure 145/65, pulse 119, temperature 98.7 F (37.1 C), temperature source Oral, resp. rate 25, height 5\' 5"  (1.651 m), weight 96.9 kg (213 lb 10 oz), SpO2 98.00%.  Intake/Output from previous day: 03/28 0701 - 03/29 0700 In: 1729.2 [P.O.:830; I.V.:899.2] Out: 2400 [Urine:2400] Intake/Output this shift: Total I/O In: -  Out: 575 [Urine:575]  General appearance: alert, cooperative, mild distress and moderately obese Resp: Crackles upper airways, more diminished on right and in bases, intermittent tachypnea On 2 L per minute sats 98%, noted to have puffing-auto PEEP respiratory effort somewhat influenced by pain and anxiety level, tender over anterior chest wall directly over her sternum Cardio: regular rate and sinus tachycardia rhythm with rates in the 120s and occasionally up to 120s, S1, S2 normal, no murmur, click, rub or gallop, mildly hypertensive - HS distant GI: soft, non-tender; bowel sounds normal; no masses,  no organomegaly Extremities: extremities normal, atraumatic, no cyanosis or edema Neurologic: Grossly normal  Lab Results:  Basename 01/17/12 0425 01/16/12 0405  WBC 23.5* 23.4*  HGB 11.3* 10.8*  HCT 35.1* 34.3*  PLT 418* 375   BMET  Basename 01/17/12 0425 01/16/12 0405  NA 138 139  K 3.6 3.7  CL 102 102  CO2 26 26  GLUCOSE 133* 93  BUN 39* 58*  CREATININE 2.75* 3.31*  CALCIUM 9.3 9.4   Medications:  I have reviewed the patient's current medications.  Assessment/Plan:  Acute respiratory failure with hypoxia *Required intubation and mechanical ventilation *Still requiring nasal cannula oxygen *poor respiratory mechanics at present - monitor in SDU *CT of the  chest today demonstrates new opacities involving the right lung and left lung as well as bilateral pleural effusions  Multiple bilateral rib fractures with possible sternal body fracture *Although surmised that occurred after her CPR at admission was finally confirmed on CT of the chest today *Continue pulmonary toileting and adequate pain control *Sternal fracture not reported as being nondisplaced and no indications for surgical repair at this time  Right upper lobe pneumonia/ E. Coli in sputum / pneumococcus in blood * sensitive to Levaquin *CT of the chest shows a very dense right upper lobe pneumonia and new opacities (possibly atelectasis) *Continue Levaquin- started on 3/21  Streptococcus pneumoniae bactermia sensitive to Levaquin  Septic shock/ Cardiac arrest *Likely cardiac arrest related to profound hypoxemia given presentation with PEA rhythm *Procalcitonin was greater than 175 *required pressor support   Acute renal failure with tubular necrosis *Is non-oliguric in nature and averaging 1400 cc of urine output every 24 hours *Located record from 2000 and patient's creatinine at that time was 0.8 *BUN and creatinine on the only slow downward trend *Creatinine at presentation was 3.91  *Renal ultrasound done revealing except for a bladder stone which is nonobstructing *Urinalysis is consistent with ATN  Asthma exacerbation *May be contributing to some of the respiratory symptoms *Need to clarify if patient also smoked *Continue nebulizer therapy *Add Xanax for anxiolytic component  Pleuritic chest pain *Continue Percocet and morphine but we will increase frequency of morphine from every 4 hours to every 2 hours *Because of acute renal failure cannot use NSAIDs or other anti-inflammatory drugs   HTN (hypertension) *Apparently not a diagnosis prior to admission *Pain seems to be contributing to high  blood pressure so we'll continue to monitor for now *Renal failure  also could be contributing   Disposition *Remain in stepdown *Daughter Marcelino Duster [phone] contacted and updated on patient's status including CT of the chest results from today and confirmation of bilateral rib fractures and possible sternal fracture   LOS: 9 days   Junious Silk, ANP pager [phone]  [facility] [email or web address] Password: [email or web address]  01/17/2012, 1:02 PM   I have examined the patient and reviewed the chart. New opacities are likely atelectasis which may be contributing to the WBC count. She is refusing PRN medication for her pain and continues to splint. Will start Oxycodone Q4 routine and incentive spirometry   Calvert Cantor, MD [phone]

## 2012-01-17 NOTE — Progress Notes (Signed)
VASCULAR LAB PRELIMINARY  PRELIMINARY  PRELIMINARY  PRELIMINARY  Bilateral lower extremity venous duplex  completed.    Preliminary report:  Bilateral:  No evidence of DVT, superficial thrombosis, or Baker's Cyst.   Terance Hart, RVT 01/17/2012, 9:35 AM

## 2012-01-17 NOTE — Progress Notes (Addendum)
Physical Therapy Treatment Patient Details Name: LABRITTANY WECHTER MRN: [id number] DOB: [birth year] Today's Date: 01/17/2012  PT Assessment/Plan  PT - Assessment/Plan Comments on Treatment Session: Pt becomes anxious and sob with activity.  Pt continues to be limited by pain in sternum.  Pt has confirmed sternal fracture and multiple bilateral rib fractures.  During todays session pt's BP increased to 186/98 O2 sats dropped to low 80s on 3L O2 via [place]. Pt became "paniced" after ambulating stating she could not catch her breath Resp Rate was around 36 at that time.  Returned pt to bed in supine with head elevated. It took several minutes for pt to "calm down".  Audible wheezing note.  RN notified and contacted respiratory therapy to treat pt.  Suggest pt be pre-medicated for pain and anxiety prior to next PT session.  Pt may be too unstable to continue PT. Will re-evauate pt next session.           PT Plan: Discharge plan remains appropriate;Frequency remains appropriate PT Frequency: Min 3X/week Follow Up Recommendations: Skilled nursing facility;Supervision/Assistance - 24 hour Equipment Recommended: Defer to next venue PT Goals  Acute Rehab PT Goals PT Goal Formulation: With patient Time For Goal Achievement: 2 weeks Pt will go Supine/Side to Sit: with min assist;with rail;with cues (comment type and amount) PT Goal: Supine/Side to Sit - Progress: Not met Pt will Sit at [facility] of Bed: with supervision;with no upper extremity support;6-10 min PT Goal: Sit at Edge Of Bed - Progress: Not met Pt will go Sit to Stand: with min assist;with upper extremity assist PT Goal: Sit to Stand - Progress: Not met Pt will go Stand to Sit: with min assist;with upper extremity assist PT Goal: Stand to Sit - Progress: Not met Pt will Transfer Bed to Chair/Chair to Bed: with min assist PT Transfer Goal: Bed to Chair/Chair to Bed - Progress: Not met Pt will Stand: with min assist;with bilateral upper extremity  support;3 - 5 min PT Goal: Stand - Progress: Not met Pt will Ambulate: 1 - 15 feet;with +2 total assist;with least restrictive assistive device;with cues (comment type and amount) PT Goal: Ambulate - Progress: Progressing toward goal Pt will Perform Home Exercise Program: with min assist PT Goal: Perform Home Exercise Program - Progress: Not met  PT Treatment Precautions/Restrictions  Precautions Precautions: Fall Precaution Comments: Sternum very sore secondary to chest compressions  Required Braces or Orthoses: No Restrictions Weight Bearing Restrictions: No Mobility (including Balance) Bed Mobility Bed Mobility: Yes Sit to Supine: 1: +2 Total assist;Patient percentage (comment);HOB flat Sit to Supine - Details (indicate cue type and reason): +2 total assist for management of bilater LE and trunk.  Pt unable to participate in transfer due to pain and anxiety (difficulty breathing).   Transfers Transfers: Yes Sit to Stand: 4: Min assist;From chair/3-in-1;With upper extremity assist;With armrests (Pt 90% two trials. ) Sit to Stand Details (indicate cue type and reason): Cued pt to scoot to edge of chair prior to attempting to stand.  Pt required min assist to stablilize pt and to reassure pt of her safety.   Stand to Sit: 4: Min assist;To chair/3-in-1;To bed;With upper extremity assist Stand to Sit Details: Verbal and tactile cues for bilateral Hand placement on armrest of chair.  Verbal cues for pt to control descent to chair.   Stand Pivot Transfers: 4: Min assist;With armrests Stand Pivot Transfer Details (indicate cue type and reason): Pt transferred bed to chair with cues for proper sequencing. Pt attempting to  sit prematurely and growing more anxious.   Squat Pivot Transfers: Not tested (comment) Ambulation/Gait Ambulation/Gait: Yes Ambulation/Gait Assistance: 3: Mod assist;Patient percentage (comment) (Pt 50%) Ambulation/Gait Assistance Details (indicate cue type and reason):  Pt required constant verbal and tactile cues to initiate gait.  Pt required assistance to manage walker and tactile cue via anterior wt displacement to force pt to initiate stepping.  Ambulation Distance (Feet): 10 Feet Assistive device: Rolling walker Gait Pattern: Step-to pattern Gait velocity: Very slow Stairs: No Wheelchair Mobility Wheelchair Mobility: No  Posture/Postural Control Posture/Postural Control: Postural limitations Postural Limitations: Pt present with flexed forward trunk posture and Rt lateral trunk bend in standing due to pain in chest.   Repeated verbal and tactile cues for upright posture, pt unable to maintain proper posture for more than a few seconds.  Attempted to provide visual cue instructing pt to look at calender on the wall. Pt still unable to maintain trunk and cervical extension.  Balance Balance Assessed: Yes Static Sitting Balance Static Sitting - Balance Support: No upper extremity supported;Feet supported;Right upper extremity supported;Bilateral upper extremity supported Static Sitting - Level of Assistance: 4: Min assist Static Sitting - Comment/# of Minutes: Pt continues to present with right lateral lean in sitting due to chest and rib pain.  Exercise    End of Session PT - End of Session Equipment Utilized During Treatment: Gait belt Activity Tolerance: Patient limited by pain;Patient limited by fatigue;Treatment limited secondary to medical complications (Comment) (Pt anxious with PT) Patient left: in bed;with call bell in reach;with family/visitor present (daughter. ) Nurse Communication: Mobility status for transfers;Need for lift equipment General Behavior During Session: [facility] for tasks performed Cognition: [facility] for tasks performed  Jarrid Lienhard 01/17/2012, 4:02 PM Elliett Guarisco L. Janney Priego DPT [phone]

## 2012-01-18 ENCOUNTER — Inpatient Hospital Stay (HOSPITAL_COMMUNITY): Payer: Medicaid Other

## 2012-01-18 ENCOUNTER — Inpatient Hospital Stay (HOSPITAL_COMMUNITY): Payer: Medicaid Other | Admitting: *Deleted

## 2012-01-18 ENCOUNTER — Encounter (HOSPITAL_COMMUNITY): Payer: Self-pay | Admitting: *Deleted

## 2012-01-18 DIAGNOSIS — J155 Pneumonia due to Escherichia coli: Secondary | ICD-10-CM

## 2012-01-18 DIAGNOSIS — S2249XA Multiple fractures of ribs, unspecified side, initial encounter for closed fracture: Secondary | ICD-10-CM

## 2012-01-18 DIAGNOSIS — R071 Chest pain on breathing: Secondary | ICD-10-CM

## 2012-01-18 LAB — POCT I-STAT 3, ART BLOOD GAS (G3+)
Acid-Base Excess: 1 mmol/L (ref 0.0–2.0)
Bicarbonate: 28.1 mEq/L — ABNORMAL HIGH (ref 20.0–24.0)
O2 Saturation: 100 %
O2 Saturation: 99 %
pCO2 arterial: 72.5 mmHg (ref 35.0–45.0)
pCO2 arterial: 55.3 mmHg — ABNORMAL HIGH (ref 35.0–45.0)
pH, Arterial: 7.224 — ABNORMAL LOW (ref 7.350–7.400)
pO2, Arterial: 435 mmHg — ABNORMAL HIGH (ref 80.0–100.0)
pO2, Arterial: 134 mmHg — ABNORMAL HIGH (ref 80.0–100.0)

## 2012-01-18 LAB — CBC
HCT: 32.3 % — ABNORMAL LOW (ref 36.0–46.0)
MCHC: 32.2 g/dL (ref 30.0–36.0)
MCV: 87.5 fL (ref 78.0–100.0)
Platelets: 436 10*3/uL — ABNORMAL HIGH (ref 150–400)
RDW: 15.7 % — ABNORMAL HIGH (ref 11.5–15.5)

## 2012-01-18 LAB — BASIC METABOLIC PANEL
BUN: 27 mg/dL — ABNORMAL HIGH (ref 6–23)
Chloride: 99 mEq/L (ref 96–112)
Creatinine, Ser: 2.57 mg/dL — ABNORMAL HIGH (ref 0.50–1.10)
GFR calc Af Amer: 22 mL/min — ABNORMAL LOW (ref >90)
GFR calc non Af Amer: 19 mL/min — ABNORMAL LOW (ref >90)
Glucose, Bld: 116 mg/dL — ABNORMAL HIGH (ref 70–99)

## 2012-01-18 LAB — POCT I-STAT 4, (NA,K, GLUC, HGB,HCT)
Glucose, Bld: 138 mg/dL — ABNORMAL HIGH (ref 70–99)
HCT: 47 % — ABNORMAL HIGH (ref 36.0–46.0)
Potassium: 3.9 mEq/L (ref 3.5–5.1)

## 2012-01-18 MED ORDER — SODIUM CHLORIDE 0.9 % IV SOLN
50.0000 ug/h | INTRAVENOUS | Status: DC
  Administered 2012-01-18 (×2): 50 ug/h via INTRAVENOUS
  Administered 2012-01-19: 150 ug/h via INTRAVENOUS
  Administered 2012-01-20: 75 ug/h via INTRAVENOUS
  Filled 2012-01-18 (×8): qty 50

## 2012-01-18 MED ORDER — MIDAZOLAM BOLUS VIA INFUSION
1.0000 mg | INTRAVENOUS | Status: DC | PRN
  Filled 2012-01-18: qty 2

## 2012-01-18 MED ORDER — MORPHINE SULFATE 2 MG/ML IJ SOLN
1.0000 mg | INTRAMUSCULAR | Status: AC
  Administered 2012-01-18: 1 mg via INTRAVENOUS
  Filled 2012-01-18: qty 1

## 2012-01-18 MED ORDER — INSULIN ASPART 100 UNIT/ML ~~LOC~~ SOLN
0.0000 [IU] | SUBCUTANEOUS | Status: DC
  Administered 2012-01-22 – 2012-02-01 (×24): 2 [IU] via SUBCUTANEOUS
  Administered 2012-02-01: 3 [IU] via SUBCUTANEOUS
  Administered 2012-02-02 (×2): 2 [IU] via SUBCUTANEOUS
  Administered 2012-02-02: 3 [IU] via SUBCUTANEOUS
  Administered 2012-02-03 (×3): 2 [IU] via SUBCUTANEOUS
  Administered 2012-02-03: 3 [IU] via SUBCUTANEOUS
  Administered 2012-02-04: 2 [IU] via SUBCUTANEOUS
  Administered 2012-02-04: 3 [IU] via SUBCUTANEOUS
  Administered 2012-02-04 – 2012-02-05 (×3): 2 [IU] via SUBCUTANEOUS
  Administered 2012-02-05: 3 [IU] via SUBCUTANEOUS
  Administered 2012-02-05: 2 [IU] via SUBCUTANEOUS
  Administered 2012-02-05: 3 [IU] via SUBCUTANEOUS
  Administered 2012-02-06: 2 [IU] via SUBCUTANEOUS
  Administered 2012-02-06: 3 [IU] via SUBCUTANEOUS
  Administered 2012-02-06: 2 [IU] via SUBCUTANEOUS
  Administered 2012-02-06 – 2012-02-07 (×2): 3 [IU] via SUBCUTANEOUS
  Administered 2012-02-07: 2 [IU] via SUBCUTANEOUS
  Administered 2012-02-07 – 2012-02-10 (×4): 3 [IU] via SUBCUTANEOUS
  Administered 2012-02-10: 2 [IU] via SUBCUTANEOUS
  Administered 2012-02-11 – 2012-02-12 (×2): 3 [IU] via SUBCUTANEOUS

## 2012-01-18 MED ORDER — PANTOPRAZOLE SODIUM 40 MG PO PACK
40.0000 mg | PACK | Freq: Every day | ORAL | Status: DC
  Administered 2012-01-18 – 2012-02-07 (×20): 40 mg
  Filled 2012-01-18 (×22): qty 20

## 2012-01-18 MED ORDER — SODIUM CHLORIDE 0.9 % IV SOLN
2.0000 mg/h | INTRAVENOUS | Status: DC
  Administered 2012-01-18 (×2): 2 mg/h via INTRAVENOUS
  Administered 2012-01-19: 3 mg/h via INTRAVENOUS
  Administered 2012-01-19: 2 mg/h via INTRAVENOUS
  Filled 2012-01-18 (×2): qty 10

## 2012-01-18 MED ORDER — SODIUM CHLORIDE 0.9 % IV SOLN
INTRAVENOUS | Status: DC
  Administered 2012-01-18 – 2012-01-29 (×3): via INTRAVENOUS
  Administered 2012-01-31: 20 mL/h via INTRAVENOUS
  Administered 2012-02-03: 1000 mL via INTRAVENOUS
  Administered 2012-02-10 – 2012-02-12 (×2): 20 mL/h via INTRAVENOUS

## 2012-01-18 MED ORDER — CHLORHEXIDINE GLUCONATE 0.12 % MT SOLN
15.0000 mL | Freq: Two times a day (BID) | OROMUCOSAL | Status: DC
  Administered 2012-01-18 – 2012-02-14 (×49): 15 mL via OROMUCOSAL
  Filled 2012-01-18 (×54): qty 15

## 2012-01-18 MED ORDER — FENTANYL BOLUS VIA INFUSION
50.0000 ug | Freq: Four times a day (QID) | INTRAVENOUS | Status: DC | PRN
  Administered 2012-01-18: 50 ug via INTRAVENOUS
  Administered 2012-01-20: 100 ug via INTRAVENOUS
  Filled 2012-01-18: qty 100

## 2012-01-18 MED ORDER — MIDAZOLAM HCL 2 MG/2ML IJ SOLN
INTRAMUSCULAR | Status: AC
  Administered 2012-01-18: 2 mg
  Filled 2012-01-18: qty 2

## 2012-01-18 MED ORDER — CHLORHEXIDINE GLUCONATE 0.12 % MT SOLN
OROMUCOSAL | Status: AC
  Administered 2012-01-18: 15 mL
  Filled 2012-01-18: qty 15

## 2012-01-18 MED ORDER — PIPERACILLIN-TAZOBACTAM 3.375 G IVPB
3.3750 g | Freq: Three times a day (TID) | INTRAVENOUS | Status: DC
  Administered 2012-01-18 – 2012-01-27 (×27): 3.375 g via INTRAVENOUS
  Filled 2012-01-18 (×29): qty 50

## 2012-01-18 MED ORDER — FENTANYL CITRATE 0.05 MG/ML IJ SOLN: 100.0000 ug | Freq: Once | INTRAMUSCULAR | Status: DC

## 2012-01-18 MED ORDER — FENTANYL CITRATE 0.05 MG/ML IJ SOLN
INTRAMUSCULAR | Status: AC
  Administered 2012-01-18: 100 ug
  Filled 2012-01-18: qty 2

## 2012-01-18 MED ORDER — VANCOMYCIN HCL IN DEXTROSE 1-5 GM/200ML-% IV SOLN
1000.0000 mg | INTRAVENOUS | Status: DC
  Administered 2012-01-18 – 2012-01-21 (×4): 1000 mg via INTRAVENOUS
  Filled 2012-01-18 (×5): qty 200

## 2012-01-18 MED ORDER — MIDAZOLAM HCL 2 MG/2ML IJ SOLN
2.0000 mg | Freq: Once | INTRAMUSCULAR | Status: DC
  Filled 2012-01-18: qty 2

## 2012-01-18 MED ORDER — SUCCINYLCHOLINE CHLORIDE 20 MG/ML IJ SOLN
INTRAMUSCULAR | Status: AC | PRN
  Administered 2012-01-18: 70 mg via INTRAVENOUS

## 2012-01-18 MED ORDER — MIDAZOLAM HCL 2 MG/2ML IJ SOLN
INTRAMUSCULAR | Status: AC
  Administered 2012-01-18: 2 mg via INTRAVENOUS
  Filled 2012-01-18: qty 4

## 2012-01-18 MED ORDER — PROPOFOL 10 MG/ML IV BOLUS
INTRAVENOUS | Status: AC | PRN
  Administered 2012-01-18: 60 mg via INTRAVENOUS

## 2012-01-18 MED ORDER — IPRATROPIUM-ALBUTEROL 18-103 MCG/ACT IN AERO
6.0000 | INHALATION_SPRAY | Freq: Four times a day (QID) | RESPIRATORY_TRACT | Status: DC
  Administered 2012-01-18 – 2012-01-27 (×36): 6 via RESPIRATORY_TRACT
  Filled 2012-01-18: qty 14.7

## 2012-01-18 MED ORDER — BIOTENE DRY MOUTH MT LIQD
15.0000 mL | Freq: Four times a day (QID) | OROMUCOSAL | Status: DC
  Administered 2012-01-19 – 2012-02-14 (×97): 15 mL via OROMUCOSAL

## 2012-01-18 MED ORDER — NALOXONE HCL 0.4 MG/ML IJ SOLN
0.2000 mg | INTRAMUSCULAR | Status: DC | PRN
  Administered 2012-01-18: 0.2 mg via INTRAVENOUS
  Filled 2012-01-18: qty 1

## 2012-01-18 NOTE — Plan of Care (Signed)
Problem: Phase II Progression Outcomes Goal: Wean O2 if indicated reintubated on 01/18/12

## 2012-01-18 NOTE — Progress Notes (Signed)
Name: Emily Harrington MRN: [id number] DOB: 09-04-1951    LOS: 10  PCCM FOLLOW UP NOTE  History of Present Illness: 61 year old female with unknown PMH who crawled from her room and lost consciousness.  EMS was called and patient was brought to the ED.  Enroute the patient decompensated and became combative and unable to protect airway.  Patient also dropped her pressure and was intubated enroute to the ED. Extubated 3/23 and moved to SDU, PCCM signed off 3/27.   Cont to have difficulty with SOB and pain management and 3/30 developed worsening lethargy and resp distress and PCCM called back.   Lines / Drains: L [place] TLC 3/20>>> 3/24 R femoral a-line 3/20>>> 3/22 ET tube 3/20>> 3/23 ETT 3/30>>>  Cultures: Blood 3/20 >> 1/2 pneumococcus Sputum 3/20>>> few E coli pan sensitive Urine 3/20>>> NEG cdiff  Neg 3/22 Sputum 3/30>>>  Antibiotics: Vancomycin 3/20>>> 3/23 Zosyn 3/20>>> 3/23 levofloxa 3/21 >>  Tests / Events: Cardiac arrest in the ED 3/20 3/20 ct abdo/pelvis- neg acute 3/20 Ct chest -Large area of airspace consolidation involving the right upper<BR>lobe most likely represents bacterial pneumonia. This will need<BR>interval radiographic followup to confirm resolution and to exclude<BR>an underlying neoplastic process which may have a similar<BR>appearance 3/20- EGDT 3/21- pos BC, off pressors  SUBJ: Mod resp distress and chest pain r/t rib fx.     Vital Signs: Filed Vitals:   01/18/12 1055  BP:   Pulse:   Temp:   Resp: 28    Intake/Output Summary (Last 24 hours) at 01/18/12 1118 Last data filed at 01/18/12 0900  Gross per 24 hour  Intake   2010 ml  Output   1575 ml  Net    435 ml   Physical Examination: General:  Chronically ill female in mild distress  Neuro:  Lethargic intermittently, improved after narcan, MAE, gen weakness HEENT:  Mm dry, no jvd  Cardiovascular:  RRR, Nl S1/S2, -M/R/G mild tachy  Lungs: resps shallow, mildly labored on , diminished  throughout, exp wheeze  Abdomen:  Soft, NT, ND and +BS. Musculoskeletal:  -edema  Skin:  Multiple scars.  Labs and Imaging:   CBC    Component Value Date/Time   WBC 24.2* 01/18/2012 0500   RBC 3.69* 01/18/2012 0500   HGB 10.4* 01/18/2012 0500   HCT 32.3* 01/18/2012 0500   PLT 436* 01/18/2012 0500   MCV 87.5 01/18/2012 0500   MCH 28.2 01/18/2012 0500   MCHC 32.2 01/18/2012 0500   RDW 15.7* 01/18/2012 0500   LYMPHSABS 1.0 01/11/2012 0340   MONOABS 0.7 01/11/2012 0340   EOSABS 0.0 01/11/2012 0340   BASOSABS 0.0 01/11/2012 0340    BMET    Component Value Date/Time   NA 136 01/18/2012 0500   K 3.4* 01/18/2012 0500   CL 99 01/18/2012 0500   CO2 27 01/18/2012 0500   GLUCOSE 116* 01/18/2012 0500   BUN 27* 01/18/2012 0500   CREATININE 2.57* 01/18/2012 0500   CALCIUM 9.0 01/18/2012 0500   GFRNONAA 19* 01/18/2012 0500   GFRAA 22* 01/18/2012 0500      Assessment and Plan: 52 yead old female with PMH of asthma who presents to the hospital after being found SOB and unresponsive. Patient likely has a RUL PNA vs mass. Intubated 3/23-3/25.  Developed worsening resp distress 3/30 and tx to ICU.   Acute hypoxic resp failure - in setting RUL PNA v mass, underlying asthma and significant atelectasis/collapse in setting mult rib/sternal fx with pain and splinting.  PLAN -  tx ICU Intubate stat Vent support  F/u cxr  F/u abg Consider FOB ??obstruction RUL   RUL PNA -- v mass.  Ecoli in previous sputum Lab 01/18/12 0500 01/17/12 0425 01/16/12 0405 01/15/12 0355 01/14/12 0410  WBC 24.2* 23.5* 23.4* 18.4* 14.1*  PLAN -  Broaden abx for now to cover HCAP with increased WBC and worsening atx Repeat sputum culture F/u cxr    Acute renal failure -- non oliguric.  SCr trending down. Renal u/s negative except for non obstructing bladder stone  Lab 01/18/12 0500 01/17/12 0425 01/16/12 0405 01/15/12 0355 01/14/12 0410  CREATININE 2.57* 2.75* 3.31* 3.43* 3.42*  PLAN -  F/u chem  Gentle volume   Rib fx/  pain  PLAN -  Continuous sedation/analgesia for now Will need pain control and aggressive pulm hygiene when extubated   HTN --  PLAN -  Monitor for now    [facility], NP 01/18/2012  11:18 AM Pager: [phone]  *Care during the described time interval was provided by me and/or other providers on the critical care team. I have reviewed this patient's available data, including medical history, events of note, physical examination and test results as part of my evaluation.  CC time 45 minutes.  Patient seen and examined, agree with above note.  I dictated the care and orders written for this patient under my direction.  Koren Bound, M.D. [phone]

## 2012-01-18 NOTE — Progress Notes (Signed)
Pt. Listless and not responding.  MD aware.  NO for Narcan given.  0.2 mg. Narcan given IV at this time.  Pt. Responding immediately.  Very restless after Narcan given.  Verbally responsive.

## 2012-01-18 NOTE — Plan of Care (Signed)
Problem: Phase II Progression Outcomes Goal: Tolerating diet NPO. reintubated today.

## 2012-01-18 NOTE — Plan of Care (Signed)
Problem: Phase I Progression Outcomes Goal: Oral Care per Protocol Outcome: Completed/Met Date Met:  01/18/12 Every 2 hours for high risk Goal: Sedation Protocol initiated if indicated Outcome: Completed/Met Date Met:  01/18/12 Fentanyl and midazolam

## 2012-01-18 NOTE — Preoperative (Signed)
Beta Blockers   Reason not to administer Beta Blockers:Not Applicable 

## 2012-01-18 NOTE — Progress Notes (Signed)
[facility] [place] TEAM 1 - Stepdown/ICU TEAM  Subjective: Patient found with abdominal breathing this AM and incoherent. Per night RN, she was very restless, pulling off her O2 multiple times but improved after IV Morphine. Pt given 0.2 mg of Narcan now and now more alert but continues to have abdominal breathing, wheezing and is now complaining of pain.   Objective: Blood pressure 141/46, pulse 123, temperature 98.4 F (36.9 C), temperature source Oral, resp. rate 28, height 5\' 5"  (1.651 m), weight 96.9 kg (213 lb 10 oz), SpO2 95.00%.  Intake/Output from previous day: 03/29 0701 - 03/30 0700 In: 2230 [P.O.:1130; I.V.:1100] Out: 1575 [Urine:1575] Intake/Output this shift: Total I/O In: 20 [P.O.:20] Out: -   General appearance: alert, very restless Resp: acute respiratory distress with use of accessory muscles, wheeze and ronchi noted in lung fields Cardio: regular rate and sinus tachycardia rhythm with rates in the 120s and occasionally up to 120s, S1, S2 normal, no murmur, click, rub or gallop, mildly hypertensive - HS distant GI: soft, non-tender; bowel sounds normal; no masses,  no organomegaly Extremities: extremities normal, atraumatic, no cyanosis or edema Neurologic: Grossly normal  Lab Results:  Basename 01/18/12 0500 01/17/12 0425  WBC 24.2* 23.5*  HGB 10.4* 11.3*  HCT 32.3* 35.1*  PLT 436* 418*   BMET  Basename 01/18/12 0500 01/17/12 0425  NA 136 138  K 3.4* 3.6  CL 99 102  CO2 27 26  GLUCOSE 116* 133*  BUN 27* 39*  CREATININE 2.57* 2.75*  CALCIUM 9.0 9.3   Medications:  I have reviewed the patient's current medications.  Assessment/Plan:  Acute respiratory failure with hypoxia *Required intubation and mechanical ventilation on admission *REpeat CT of the chest was done due to increasing WBC counts on 2/29-  demonstrates new opacities (atelectasis?)  involving the left lung as well as bilateral pleural effusions, b/l rib fractures and a  probable sternal fracture.  We attempted to control the pain with Morphine and Oxycodone- we could not give NSAIDS due to ARF Increasing respiratory distress now is likely due to pain, splinting and atelectasis. Will re- consult PCCM- spoke with the NP just now. I discussed with her the fact that she may need to be intubated again.   I have already ordered a repeat CXR and an ABG.  Multiple bilateral rib fractures with possible sternal body fracture/Pleuritic chest pain Unfortunately pain has been difficult to control - she was initially refusing pain medication and continued to splint. We started a small dose yesterday to help prevent splinting but she became over-narcotized when additional Morphine was given to her by night nurse to help her rest.   Right upper lobe pneumonia/ E. Coli in sputum / pneumococcus in blood * sensitive to Levaquin *CT of the chest shows a very dense right upper lobe pneumonia and new opacities (possibly atelectasis) *Continue Levaquin- started on 3/21  Streptococcus pneumoniae bactermia sensitive to Levaquin  Septic shock/ Cardiac arrest *Likely cardiac arrest related to profound hypoxemia given presentation with PEA rhythm *Procalcitonin was greater than 175 *required pressor support   Acute renal failure with tubular necrosis *Is non-oliguric in nature and averaging 1400 cc of urine output every 24 hours *Located record from 2000 and patient's creatinine at that time was 0.8 *BUN and creatinine on the only slow downward trend *Creatinine at presentation was 3.91  *Renal ultrasound done was unrevealing except for a bladder stone which is nonobstructing *Urinalysis is consistent with ATN  Asthma exacerbation *May be contributing to  some of the respiratory symptoms *Need to clarify if patient also smoked   HTN (hypertension) *Pain seems to be contributing to high blood pressure so we'll continue to monitor for now   Disposition Daughter Marcelino Duster  [phone] - updated on today's events.   Calvert Cantor, MD [phone]   LOS: 10 days   01/18/2012, 10:58 AM

## 2012-01-18 NOTE — Procedures (Signed)
Central Venous Catheter Insertion Procedure Note Emily Harrington [id number] 09-04-1951  Procedure: Insertion of Central Venous Catheter Indications: Assessment of intravascular volume and Drug and/or fluid administration  Procedure Details Consent: Unable to obtain consent because of emergent medical necessity. Time Out: Verified patient identification, verified procedure, site/side was marked, verified correct patient position, special equipment/implants available, medications/allergies/relevent history reviewed, required imaging and test results available.  Performed  Maximum sterile technique was used including antiseptics, cap, gloves, gown, hand hygiene, mask and sheet. Skin prep: Chlorhexidine; local anesthetic administered A antimicrobial bonded/coated triple lumen catheter was placed in the left internal jugular vein using the Seldinger technique.  Evaluation Blood flow good Complications: No apparent complications Patient did tolerate procedure well. Chest X-ray ordered to verify placement.  CXR: pending.   Using ultrasound guidance, performed under direct MD supervision.    Danford Bad, NP 01/18/2012, 2:08 PM  Patient seen and examined, agree with above note.  I dictated the care and orders written for this patient under my direction.  Koren Bound, M.D. [phone]

## 2012-01-18 NOTE — Anesthesia Preprocedure Evaluation (Signed)
Anesthesia Evaluation  Patient identified by MRN, date of birth, ID band Patient confused    Reviewed: Allergy & Precautions, H&P , NPO status , Patient's Chart, lab work & pertinent test results, reviewed documented beta blocker date and time   Airway Mallampati: II      Dental  (+) Edentulous Upper and Edentulous Lower   Pulmonary asthma , pneumonia ,          Cardiovascular hypertension,     Neuro/Psych    GI/Hepatic   Endo/Other    Renal/GU      Musculoskeletal   Abdominal   Peds  Hematology   Anesthesia Other Findings   Reproductive/Obstetrics                           Anesthesia Physical Anesthesia Plan  ASA: III and Emergent  Anesthesia Plan: General   Post-op Pain Management:    Induction: Intravenous  Airway Management Planned: Oral ETT  Additional Equipment:   Intra-op Plan:   Post-operative Plan: Post-operative intubation/ventilation  Informed Consent:   History available from chart only  Plan Discussed with:   Anesthesia Plan Comments:         Anesthesia Quick Evaluation

## 2012-01-18 NOTE — Progress Notes (Signed)
ANTIBIOTIC CONSULT NOTE - INITIAL  Pharmacy Consult for Vancomycin/Zosyn Indication: Suspected HCAP  Allergies  Allergen Reactions  . Nsaids     Patient Measurements: Height: 5\' 5"  (165.1 cm) Weight: 213 lb 10 oz (96.9 kg) IBW/kg (Calculated) : 57    Vital Signs: Temp: 98.6 F (37 C) (03/30 1153) Temp src: Oral (03/30 1153) BP: 144/55 mmHg (03/30 1203) Pulse Rate: 114  (03/30 1203) Intake/Output from previous day: 03/29 0701 - 03/30 0700 In: 2230 [P.O.:1130; I.V.:1100] Out: 1575 [Urine:1575] Intake/Output from this shift: Total I/O In: 20 [P.O.:20] Out: 300 [Urine:300]  Labs:  Basename 01/18/12 0500 01/17/12 0425 01/16/12 0405  WBC 24.2* 23.5* 23.4*  HGB 10.4* 11.3* 10.8*  PLT 436* 418* 375  LABCREA -- -- --  CREATININE 2.57* 2.75* 3.31*   Estimated Creatinine Clearance: 26.2 ml/min (by C-G formula based on Cr of 2.57). No results found for this basename: VANCOTROUGH:2,VANCOPEAK:2,VANCORANDOM:2,GENTTROUGH:2,GENTPEAK:2,GENTRANDOM:2,TOBRATROUGH:2,TOBRAPEAK:2,TOBRARND:2,AMIKACINPEAK:2,AMIKACINTROU:2,AMIKACIN:2, in the last 72 hours   Microbiology: Recent Results (from the past 720 hour(s))  CULTURE, RESPIRATORY     Status: Normal   Collection Time   01/08/12  4:27 PM      Component Value Range Status Comment   Specimen Description ENDOTRACHEAL   Final    Special Requests NONE   Final    Gram Stain     Final    Value: FEW WBC PRESENT,BOTH PMN AND MONONUCLEAR     RARE SQUAMOUS EPITHELIAL CELLS PRESENT     FEW GRAM POSITIVE COCCI IN PAIRS     RARE GRAM POSITIVE RODS     RARE GRAM NEGATIVE RODS   Culture FEW ESCHERICHIA COLI   Final    Report Status 01/11/2012 FINAL   Final    Organism ID, Bacteria ESCHERICHIA COLI   Final   MRSA PCR SCREENING     Status: Normal   Collection Time   01/08/12  5:48 PM      Component Value Range Status Comment   MRSA by PCR NEGATIVE  NEGATIVE  Final   CULTURE, BLOOD (ROUTINE X 2)     Status: Normal   Collection Time   01/08/12   5:50 PM      Component Value Range Status Comment   Specimen Description BLOOD ARM RIGHT   Final    Special Requests BOTTLES DRAWN AEROBIC AND ANAEROBIC 10CC   Final    Culture  Setup Time [id number]   Final    Culture     Final    Value: STREPTOCOCCUS PNEUMONIAE     Note: Gram Stain Report Called to,Read Back By and Verified With: JULIANNA CLAIR @ 1450 01/09/12 WICKN   Report Status 01/11/2012 FINAL   Final    Organism ID, Bacteria STREPTOCOCCUS PNEUMONIAE   Final   CULTURE, BLOOD (ROUTINE X 2)     Status: Normal   Collection Time   01/08/12  6:00 PM      Component Value Range Status Comment   Specimen Description BLOOD ARM LEFT   Final    Special Requests BOTTLES DRAWN AEROBIC ONLY 10CC   Final    Culture  Setup Time [id number]   Final    Culture NO GROWTH 5 DAYS   Final    Report Status 01/15/2012 FINAL   Final   URINE CULTURE     Status: Normal   Collection Time   01/08/12  8:46 PM      Component Value Range Status Comment   Specimen Description URINE, CATHETERIZED   Final    Special Requests  NONE   Final    Culture  Setup Time [id number]   Final    Colony Count NO GROWTH   Final    Culture NO GROWTH   Final    Report Status 01/09/2012 FINAL   Final   CLOSTRIDIUM DIFFICILE BY PCR     Status: Normal   Collection Time   01/10/12  3:51 AM      Component Value Range Status Comment   C difficile by pcr NEGATIVE  NEGATIVE  Final     Medical History: Past Medical History  Diagnosis Date  . Asthma     Medications:  Scheduled:    . albuterol-ipratropium  6 puff Inhalation Q6H  . antiseptic oral rinse  15 mL Mouth Rinse BID  . aspirin EC  162 mg Oral Daily  . enoxaparin (LOVENOX) injection  30 mg Subcutaneous Q24H  . fentaNYL  50 mcg Intravenous Once  . midazolam      .  morphine injection  1 mg Intravenous NOW  . piperacillin-tazobactam (ZOSYN)  IV  3.375 g Intravenous Q8H  . vancomycin  1,000 mg Intravenous Q24H  . DISCONTD: feeding supplement  1 Container Oral BID  BM  . DISCONTD: levofloxacin  250 mg Oral Daily  . DISCONTD: oxyCODONE  5 mg Per Tube Q4H  . DISCONTD: oxyCODONE  5 mg Oral Q4H   Infusions:    . dextrose 5 % and 0.45% NaCl 50 mL/hr (01/18/12 0812)  . fentaNYL infusion INTRAVENOUS    . midazolam (VERSED) infusion     Anti-infectives     Start     Dose/Rate Route Frequency Ordered Stop   01/18/12 1300   vancomycin (VANCOCIN) IVPB 1000 mg/200 mL premix        1,000 mg 200 mL/hr over 60 Minutes Intravenous Every 24 hours 01/18/12 1215     01/18/12 1300  piperacillin-tazobactam (ZOSYN) IVPB 3.375 g       3.375 g 12.5 mL/hr over 240 Minutes Intravenous Every 8 hours 01/18/12 1215     01/13/12 1000   levofloxacin (LEVAQUIN) tablet 250 mg  Status:  Discontinued        250 mg Oral Daily 01/12/12 1150 01/18/12 1145   01/12/12 1200   levofloxacin (LEVAQUIN) tablet 500 mg        500 mg Oral  Once 01/12/12 1135 01/12/12 1250   01/12/12 1200   levofloxacin (LEVAQUIN) tablet 250 mg  Status:  Discontinued        250 mg Oral Daily 01/12/12 1149 01/12/12 1150   01/09/12 1100   Levofloxacin (LEVAQUIN) IVPB 750 mg  Status:  Discontinued        750 mg 100 mL/hr over 90 Minutes Intravenous Every 48 hours 01/09/12 1028 01/12/12 1133   01/09/12 1000   vancomycin (VANCOCIN) IVPB 1000 mg/200 mL premix  Status:  Discontinued        1,000 mg 200 mL/hr over 60 Minutes Intravenous Every 24 hours 01/09/12 1403 01/11/12 0956   01/08/12 2300   piperacillin-tazobactam (ZOSYN) IVPB 3.375 g  Status:  Discontinued        3.375 g 12.5 mL/hr over 240 Minutes Intravenous Every 8 hours 01/08/12 1659 01/11/12 0956   01/08/12 1800   vancomycin (VANCOCIN) 1,500 mg in sodium chloride 0.9 % 500 mL IVPB  Status:  Discontinued        1,500 mg 250 mL/hr over 120 Minutes Intravenous Every 12 hours 01/08/12 1659 01/09/12 1403   01/08/12 1700  piperacillin-tazobactam (ZOSYN) IVPB 3.375  g       3.375 g 100 mL/hr over 30 Minutes Intravenous  Once 01/08/12 1659 01/08/12  2043         Assessment: 61 yo F w/ unknown PMH, with SOB and resp distress, ARF. Pharmacy consulted to dose vancomycin and zosyn for broad empiric abx to cover for suspected HCAP. Ecoli from previous sputum. Pt continues to have elevated WBC at 24.2<<23.5<<23.4<<18.4. Pt is afebrile (tmax 99.5). Renal fx appears to be stablizing and scr is trending down: 2.57<<2.75<<3.31. Est crcl ~25-30 ml/min. UOP/24hrs = 0.3ml/kg/hr (1575 ml total).   Goal of Therapy:  Vancomycin trough level 15-20 mcg/ml  Plan:  1) Vancomycin 1gm IV q24 hrs 2) Zosyn 3.375 gm IV q8hrs (4hr infusion) 3) will monitor renal fx closely and adjust abx as needed 4) F/U repeat sputum cx, wbc trend, check trough at steady state (or soon based on renal fx)  Janace Litten, PharmD [phone] 01/18/2012,12:15 PM

## 2012-01-18 NOTE — Progress Notes (Signed)
Pt. Transferred to 2315 post intubation due to respiratory decompensation.  Report given to nurse prior to transfer.  Daughter called and updated re: status.  She states she would be in after 1600h. When she would be off from work.

## 2012-01-18 NOTE — Progress Notes (Signed)
eLink Physician-Brief Progress Note Patient Name: Emily Harrington DOB: 02-Jun-1951 MRN: [id number]  Date of Service  01/18/2012   HPI/Events of Note     eICU Interventions  SSI - moderate scale Protonix   Intervention Category Intermediate Interventions: Best-practice therapies (e.g. DVT, beta blocker, etc.)  Emily Harrington V. 01/18/2012, 6:43 PM

## 2012-01-19 LAB — CBC
HCT: 27.7 % — ABNORMAL LOW (ref 36.0–46.0)
Hemoglobin: 8.8 g/dL — ABNORMAL LOW (ref 12.0–15.0)
MCHC: 31.8 g/dL (ref 30.0–36.0)
RBC: 3.16 MIL/uL — ABNORMAL LOW (ref 3.87–5.11)
WBC: 20.8 10*3/uL — ABNORMAL HIGH (ref 4.0–10.5)

## 2012-01-19 LAB — GLUCOSE, CAPILLARY
Glucose-Capillary: 71 mg/dL (ref 70–99)
Glucose-Capillary: 74 mg/dL (ref 70–99)
Glucose-Capillary: 77 mg/dL (ref 70–99)

## 2012-01-19 LAB — BASIC METABOLIC PANEL
BUN: 21 mg/dL (ref 6–23)
CO2: 27 mEq/L (ref 19–32)
Chloride: 101 mEq/L (ref 96–112)
Glucose, Bld: 100 mg/dL — ABNORMAL HIGH (ref 70–99)
Potassium: 3.5 mEq/L (ref 3.5–5.1)

## 2012-01-19 MED ORDER — DEXTROSE 50 % IV SOLN
INTRAVENOUS | Status: AC
  Administered 2012-01-19: 50 mL
  Filled 2012-01-19: qty 50

## 2012-01-19 MED ORDER — PROPOFOL 10 MG/ML IV EMUL
5.0000 ug/kg/min | INTRAVENOUS | Status: DC
  Administered 2012-01-19: 5 ug/kg/min via INTRAVENOUS
  Administered 2012-01-20 – 2012-01-22 (×4): 10 ug/kg/min via INTRAVENOUS
  Administered 2012-01-23: 25 ug/kg/min via INTRAVENOUS
  Filled 2012-01-19 (×6): qty 100

## 2012-01-19 MED ORDER — ZINC OXIDE 20 % EX OINT
TOPICAL_OINTMENT | Freq: Two times a day (BID) | CUTANEOUS | Status: DC
  Administered 2012-01-19 – 2012-02-13 (×50): via TOPICAL
  Filled 2012-01-19 (×79): qty 28

## 2012-01-19 MED ORDER — DEXTROSE 50 % IV SOLN: 50.0000 mL | Freq: Once | INTRAVENOUS | Status: AC | PRN

## 2012-01-19 MED ORDER — PROPOFOL 10 MG/ML IV EMUL
INTRAVENOUS | Status: AC
  Administered 2012-01-20: 10 ug/kg/min via INTRAVENOUS
  Filled 2012-01-19: qty 100

## 2012-01-19 NOTE — Progress Notes (Signed)
excoriated red hot rash between thighs and most of sacrum and buttocks, shown to Dr. Vassie Loll, cream ordered

## 2012-01-19 NOTE — Progress Notes (Signed)
Wasted 15 cc versed from IV drip, witnessed by [employer]

## 2012-01-19 NOTE — Progress Notes (Signed)
CCM to order wound/skin care consult tomorrow

## 2012-01-19 NOTE — Progress Notes (Signed)
Name: Emily Harrington MRN: [id number] DOB: [birth year]    LOS: 11  PCCM FOLLOW UP NOTE  History of Present Illness: 61 year old female presented 3/20 with strep bacteremia ,PEA arrest requiring CPR was intubated enroute to the ED. Extubated 3/23 and moved to SDU, PCCM signed off 3/27.   Cont to have difficulty with SOB and pain management and 3/30 developed worsening lethargy and resp distress and reintubated for hypercarbic resp failure ? Narcotic induced  Lines / Drains: L [place] TLC 3/20>>> 3/24 R femoral a-line 3/20>>> 3/22 ET tube 3/20>> 3/23 ETT 3/30>>>  Cultures: Blood 3/20 >> 1/2 pneumococcus Sputum 3/20>>> few E coli pan sensitive Urine 3/20>>> NEG cdiff  Neg 3/22 Sputum 3/30>>>  Antibiotics: Vancomycin 3/20>>> 3/23, 3/30 (clinical worsening) >               Zosyn 3/20>>> 3/23, 3/30 (clinical worsening ) >> levofloxa 3/21 >> 3/30   Tests / Events: Cardiac arrest in the ED 3/20 3/20 ct abdo/pelvis- neg acute 3/20 Ct chest -Large area of airspace consolidation involving the right upper<BR>lobe most likely represents bacterial pneumonia. This will need<BR>interval radiographic followup to confirm resolution and to exclude<BR>an underlying neoplastic process which may have a similar<BR>appearance 3/20- EGDT 3/21 off pressors 3/30 reintubated  SUBJ: Agitated this am on versed/ fent gtts   Vital Signs: Filed Vitals:   01/19/12 0900  BP: 119/40  Pulse: 117  Temp:   Resp: 24    Intake/Output Summary (Last 24 hours) at 01/19/12 4098 Last data filed at 01/19/12 0900  Gross per 24 hour  Intake 2038.57 ml  Output   1490 ml  Net 548.57 ml   Physical Examination: General:  Chronically ill female in mild distress  Neuro:unresponsive on drips, MAE priro, gen weakness HEENT:  Mm dry, no jvd  Cardiovascular:  RRR, Nl S1/S2, -M/R/G mild tachy  Lungs: resps shallow, mildly labored on [place], diminished throughout, exp wheeze  Abdomen:  Soft, NT, ND and  +BS. Musculoskeletal:  -edema  Skin:  Multiple scars, erythematous area over genitals , inner thighs,   Labs and Imaging:   CBC    Component Value Date/Time   WBC 20.8* 01/19/2012 0411   RBC 3.16* 01/19/2012 0411   HGB 8.8* 01/19/2012 0411   HCT 27.7* 01/19/2012 0411   PLT 411* 01/19/2012 0411   MCV 87.7 01/19/2012 0411   MCH 27.8 01/19/2012 0411   MCHC 31.8 01/19/2012 0411   RDW 16.2* 01/19/2012 0411   LYMPHSABS 1.0 01/11/2012 0340   MONOABS 0.7 01/11/2012 0340   EOSABS 0.0 01/11/2012 0340   BASOSABS 0.0 01/11/2012 0340    BMET    Component Value Date/Time   NA 136 01/19/2012 0411   K 3.5 01/19/2012 0411   CL 101 01/19/2012 0411   CO2 27 01/19/2012 0411   GLUCOSE 100* 01/19/2012 0411   BUN 21 01/19/2012 0411   CREATININE 2.56* 01/19/2012 0411   CALCIUM 8.6 01/19/2012 0411   GFRNONAA 19* 01/19/2012 0411   GFRAA 22* 01/19/2012 0411      Assessment and Plan: 23 yead old female with PMH of asthma who presents to the hospital after being found SOB and unresponsive. Patient likely has a RUL PNA vs mass. Intubated 3/23-3/25.  Developed worsening resp distress 3/30 and tx to ICU.   Acute hypoxic resp failure - in setting RUL PNA v mass, underlying asthma and significant atelectasis/collapse in setting mult rib/sternal fx with pain and splinting + narcotics ? PLAN -  Doubt FOB needed -  RUL consolidation shows air bronchograms  Wean once agitation controlled   RUL PNA -- v mass.  Ecoli in previous sputum  Lab 01/19/12 0411 01/18/12 0500 01/17/12 0425 01/16/12 0405 01/15/12 0355  WBC 20.8* 24.2* 23.5* 23.4* 18.4*  PLAN -  Broaden abx for now to cover HCAP with increased WBC and worsening atx Repeat sputum culture  Agitation - Use propofol gtt & fent  Prefer to avoid versed  Acute renal failure -- non oliguric.  SCr trending down. Renal u/s negative except for non obstructing bladder stone   Lab 01/19/12 0411 01/18/12 0500 01/17/12 0425 01/16/12 0405 01/15/12 0355  CREATININE 2.56* 2.57*  2.75* 3.31* 3.43*  PLAN -  F/u chem  Gentle volume   Rib fx/ pain  PLAN -  Fent gtt  for now Will need pain control and aggressive pulm hygiene when extubated   HTN --  PLAN -  Monitor for now  Prophylaxis - lovenox, protonix  *Care during the described time interval was provided by me and/or other providers on the critical care team. I have reviewed this patient's available data, including medical history, events of note, physical examination and test results as part of my evaluation.  CC time 35 minutes.    [name]., M.D. [phone]

## 2012-01-19 NOTE — Plan of Care (Signed)
Problem: Phase I Progression Outcomes Goal: GIProphysixis Outcome: Completed/Met Date Met:  01/19/12 protonix

## 2012-01-20 ENCOUNTER — Inpatient Hospital Stay (HOSPITAL_COMMUNITY): Payer: Medicaid Other

## 2012-01-20 LAB — BASIC METABOLIC PANEL
BUN: 17 mg/dL (ref 6–23)
Chloride: 101 mEq/L (ref 96–112)
GFR calc non Af Amer: 17 mL/min — ABNORMAL LOW (ref >90)
Glucose, Bld: 97 mg/dL (ref 70–99)
Potassium: 3.7 mEq/L (ref 3.5–5.1)

## 2012-01-20 LAB — CBC
HCT: 27.1 % — ABNORMAL LOW (ref 36.0–46.0)
Hemoglobin: 8.7 g/dL — ABNORMAL LOW (ref 12.0–15.0)
MCH: 28.2 pg (ref 26.0–34.0)
MCHC: 32.1 g/dL (ref 30.0–36.0)
RDW: 16.7 % — ABNORMAL HIGH (ref 11.5–15.5)

## 2012-01-20 LAB — CULTURE, RESPIRATORY W GRAM STAIN

## 2012-01-20 LAB — GLUCOSE, CAPILLARY
Glucose-Capillary: 101 mg/dL — ABNORMAL HIGH (ref 70–99)
Glucose-Capillary: 103 mg/dL — ABNORMAL HIGH (ref 70–99)
Glucose-Capillary: 83 mg/dL (ref 70–99)
Glucose-Capillary: 92 mg/dL (ref 70–99)

## 2012-01-20 MED ORDER — SODIUM CHLORIDE 0.9 % IV BOLUS (SEPSIS)
500.0000 mL | Freq: Once | INTRAVENOUS | Status: AC
  Administered 2012-01-20: 500 mL via INTRAVENOUS

## 2012-01-20 MED ORDER — POTASSIUM CHLORIDE 10 MEQ/100ML IV SOLN: 10.0000 meq | INTRAVENOUS | Status: AC

## 2012-01-20 MED ORDER — CLONIDINE HCL 0.1 MG PO TABS
0.1000 mg | ORAL_TABLET | Freq: Two times a day (BID) | ORAL | Status: DC
  Administered 2012-01-20 – 2012-02-07 (×35): 0.1 mg
  Filled 2012-01-20 (×39): qty 1

## 2012-01-20 MED ORDER — MIDAZOLAM HCL 2 MG/2ML IJ SOLN
2.0000 mg | Freq: Once | INTRAMUSCULAR | Status: AC
  Administered 2012-01-20: 2 mg via INTRAVENOUS

## 2012-01-20 NOTE — Progress Notes (Signed)
Name: Emily Harrington MRN: [id number] DOB: Sep 28, 1951    LOS: 12  PCCM FOLLOW UP NOTE  History of Present Illness: 61 year old female presented 3/20 with strep bacteremia ,PEA arrest requiring CPR was intubated enroute to the ED. Extubated 3/23 and moved to SDU, PCCM signed off 3/27.   Cont to have difficulty with SOB and pain management and 3/30 developed worsening lethargy and resp distress and reintubated for hypercarbic resp failure ? Narcotic induced  Lines / Drains: L [place] TLC 3/20>>> 3/24 R femoral a-line 3/20>>> 3/22 ET tube 3/20>> 3/23 ETT 3/30>>>  Cultures: Blood 3/20 >> 1/2 pneumococcus Sputum 3/20>>> few E coli pan sensitive Urine 3/20>>> NEG cdiff  Neg 3/22 Sputum 3/30>>>rare GPC>>>  Antibiotics: Vancomycin 3/20>>> 3/23, 3/30 (clinical worsening) >               Zosyn 3/20>>> 3/23, 3/30 (clinical worsening ) >> levofloxa 3/21 >> 3/30   Tests / Events: Cardiac arrest in the ED 3/20 3/20 ct abdo/pelvis- neg acute 3/20 Ct chest -Large area of airspace consolidation involving the right upper<BR>lobe most likely represents bacterial pneumonia. This will need<BR>interval radiographic followup to confirm resolution and to exclude<BR>an underlying neoplastic process which may have a similar<BR>appearance 3/20- EGDT 3/21 off pressors 3/30 reintubated  SUBJ: Remains easily agitated on fentanyl and propofol.  Worsening CXR   Vital Signs: BP 126/43  Pulse 110  Temp(Src) 99.1 F (37.3 C) (Oral)  Resp 16  Ht 5\' 5"  (1.651 m)  Wt 213 lb 10 oz (96.9 kg)  BMI 35.55 kg/m2  SpO2 100%   Intake/Output Summary (Last 24 hours) at 01/20/12 0804 Last data filed at 01/20/12 0700  Gross per 24 hour  Intake 2875.5 ml  Output   1020 ml  Net 1855.5 ml   Physical Examination: General:  Chronically ill female NAD  Neuro: awake, easily agitated on fent/propofol, MAE, gen weakness HEENT:  Mm dry, no jvd, ETT Cardiovascular:  RRR, Nl S1/S2, -M/R/G mild tachy  Lungs: resps  even non labored on vent, diminished R>L, exp wheeze  Abdomen:  Soft, NT, ND and +BS. Musculoskeletal:  -edema  Skin:  Multiple scars, erythematous area over genitals , inner thighs,   Labs and Imaging:   CBC    Component Value Date/Time   WBC 21.5* 01/20/2012 0425   RBC 3.09* 01/20/2012 0425   HGB 8.7* 01/20/2012 0425   HCT 27.1* 01/20/2012 0425   PLT 425* 01/20/2012 0425   MCV 87.7 01/20/2012 0425   MCH 28.2 01/20/2012 0425   MCHC 32.1 01/20/2012 0425   RDW 16.7* 01/20/2012 0425   LYMPHSABS 1.0 01/11/2012 0340   MONOABS 0.7 01/11/2012 0340   EOSABS 0.0 01/11/2012 0340   BASOSABS 0.0 01/11/2012 0340    BMET    Component Value Date/Time   NA 135 01/20/2012 0425   K 3.7 01/20/2012 0425   CL 101 01/20/2012 0425   CO2 26 01/20/2012 0425   GLUCOSE 97 01/20/2012 0425   BUN 17 01/20/2012 0425   CREATININE 2.77* 01/20/2012 0425   CALCIUM 8.5 01/20/2012 0425   GFRNONAA 17* 01/20/2012 0425   GFRAA 20* 01/20/2012 0425      Assessment and Plan: 34 yead old female with PMH of asthma who presents to the hospital after being found SOB and unresponsive. Patient likely has a RUL PNA vs mass. Intubated 3/23-3/25.  Developed worsening resp distress 3/30 and tx to ICU.   Acute hypoxic resp failure - in setting RUL PNA v mass, underlying asthma  and significant atelectasis/collapse in setting mult rib/sternal fx with pain and splinting + narcotics ?  Worsening R atx/collapse on CXR. PLAN -  Cont vent support Plan FOB today  F/u CXR SBT in am  Will need adequate pain control once extubated.   RUL PNA -- v mass.  Ecoli in previous sputum  Lab 01/20/12 0425 01/19/12 0411 01/18/12 0500 01/17/12 0425 01/16/12 0405  WBC 21.5* 20.8* 24.2* 23.5* 23.4*  PLAN -  Broaden abx for now to cover HCAP with increased WBC and worsening atx Repeat sputum culture pending  Agitation - Use propofol gtt & fent  Prefer to avoid versed  Acute renal failure -- non oliguric.  SCr trending back up. Renal u/s negative except for non obstructing  bladder stone   Lab 01/20/12 0425 01/19/12 0411 01/18/12 0500 01/17/12 0425 01/16/12 0405  CREATININE 2.77* 2.56* 2.57* 2.75* 3.31*  PLAN -  F/u chem  Gentle volume   Rib fx/ pain  PLAN -  Fent gtt  for now Will need pain control and aggressive pulm hygiene when extubated   HTN --  PLAN -  Will add low dose clonidine 4/1, may also help some with agitation   Prophylaxis - lovenox, protonix   WHITEHEART,KATHRYN, NP 01/20/2012  8:04 AM Pager: [phone]  *Care during the described time interval was provided by me and/or other providers on the critical care team. I have reviewed this patient's available data, including medical history, events of note, physical examination and test results as part of my evaluation.  Will bronch later today and attempt to get sample for diagnosis as well as remove secretion.  Doubt extubation anytime soon given unstable chest post CPR.  Will likely need trach.  CC time 35 min.  Patient seen and examined, agree with above note.  I dictated the care and orders written for this patient under my direction.  Koren Bound, M.D. [phone]

## 2012-01-20 NOTE — Progress Notes (Addendum)
Video Bronchoscopy with intervention BAL for cytology from RUL and intervention BAL for culture from RLL   and intervention Brushing.

## 2012-01-20 NOTE — Progress Notes (Signed)
eLink Physician-Brief Progress Note Patient Name: Emily Harrington DOB: 1951/01/07 MRN: [id number]  Date of Service  01/20/2012   HPI/Events of Note     eICU Interventions  Hypokalemia, repleted    Intervention Category Intermediate Interventions: Electrolyte abnormality - evaluation and management  [name] 01/20/2012, 5:48 AM

## 2012-01-20 NOTE — Progress Notes (Signed)
eLink Physician-Brief Progress Note Patient Name: CATHE BILGER DOB: 07/08/1951 MRN: [id number]  Date of Service  01/20/2012   HPI/Events of Note   Notified by RN of oliguria, tachycardia  eICU Interventions  500cc NS bolus IV now   Intervention Category Intermediate Interventions: Oliguria - evaluation and management  Bev Drennen 01/20/2012, 2:06 AM

## 2012-01-20 NOTE — Progress Notes (Signed)
PT Cancellation Note  Treatment cancelled today due to medical issues with patient which prohibited therapy. Pt now reintubated and sedated. Will follow for appropriateness to resume activity/therapy.  Ricka Westra 01/20/2012, 8:49 AM Pager [phone]

## 2012-01-20 NOTE — Procedures (Signed)
Bronchoscopy Procedure Note Emily Harrington [id number] 01/30/1951  Procedure: Bronchoscopy Indications: Diagnostic evaluation of the airways, Obtain specimens for culture and/or other diagnostic studies and Remove secretions  Procedure Details Consent: Risks of procedure as well as the alternatives and risks of each were explained to the (patient/caregiver).  Consent for procedure obtained. Time Out: Verified patient identification, verified procedure, site/side was marked, verified correct patient position, special equipment/implants available, medications/allergies/relevent history reviewed, required imaging and test results available.  Performed  In preparation for procedure, patient was given 100% FiO2. Sedation: Benzodiazepines and Short-acting barbiturates  Airway entered and the following bronchi were examined: RUL, RML, RLL, LUL, LLL and Bronchi.   Procedures performed: Brushings performed Bronchoscope removed.  , Patient placed back on 100% FiO2 at conclusion of procedure.    Evaluation Hemodynamic Status: BP stable throughout; O2 sats: stable throughout Patient's Current Condition: stable Specimens:  Sent purulent fluid Complications: No apparent complications Patient did tolerate procedure well.  Brush from RUL posterior segment, BAL for cytology from RUL and BAL for culture from RLL.  Emily Harrington 01/20/2012

## 2012-01-20 NOTE — Progress Notes (Signed)
UR Completed.  Emily Harrington [phone] 01/20/2012  

## 2012-01-20 NOTE — Progress Notes (Signed)
Molli Knock MD performed bronchoscopy at bedside. fentanyl, 2mg  versed and 5mg  propofol given per MD order. Patient tolerated procedure well. VSS. HR 106, pulse ox 100% on ventilator, RR 16, BP 110/60. Will continue to monitor.

## 2012-01-20 NOTE — Progress Notes (Signed)
Clinical Social Worker received referral from [facility] CSW.  CSW noted that pt's currently plan is for SNF.  CSW phoned financial counselor and [facility] DSS to follow up on financial resources.  CSW to continue to follow and assist as needed.     Angelia Mould, MSW, [place] [phone]

## 2012-01-21 ENCOUNTER — Inpatient Hospital Stay (HOSPITAL_COMMUNITY): Payer: Medicaid Other

## 2012-01-21 DIAGNOSIS — B953 Streptococcus pneumoniae as the cause of diseases classified elsewhere: Secondary | ICD-10-CM

## 2012-01-21 DIAGNOSIS — R7881 Bacteremia: Secondary | ICD-10-CM

## 2012-01-21 LAB — CBC
Hemoglobin: 8.2 g/dL — ABNORMAL LOW (ref 12.0–15.0)
MCHC: 32.5 g/dL (ref 30.0–36.0)
RBC: 2.85 MIL/uL — ABNORMAL LOW (ref 3.87–5.11)
WBC: 16.2 10*3/uL — ABNORMAL HIGH (ref 4.0–10.5)

## 2012-01-21 LAB — BASIC METABOLIC PANEL
CO2: 26 mEq/L (ref 19–32)
Chloride: 102 mEq/L (ref 96–112)
GFR calc non Af Amer: 18 mL/min — ABNORMAL LOW (ref >90)
Glucose, Bld: 93 mg/dL (ref 70–99)
Potassium: 3.5 mEq/L (ref 3.5–5.1)
Sodium: 137 mEq/L (ref 135–145)

## 2012-01-21 LAB — GLUCOSE, CAPILLARY
Glucose-Capillary: 89 mg/dL (ref 70–99)
Glucose-Capillary: 97 mg/dL (ref 70–99)
Glucose-Capillary: 100 mg/dL — ABNORMAL HIGH (ref 70–99)

## 2012-01-21 MED ORDER — POTASSIUM CHLORIDE 10 MEQ/100ML IV SOLN: 10.0000 meq | INTRAVENOUS | Status: DC

## 2012-01-21 MED ORDER — PROMOTE PO LIQD
1000.0000 mL | ORAL | Status: DC
  Administered 2012-01-22 – 2012-02-04 (×14): 1000 mL
  Filled 2012-01-21 (×20): qty 1000

## 2012-01-21 MED ORDER — POTASSIUM CHLORIDE 10 MEQ/50ML IV SOLN
INTRAVENOUS | Status: AC
  Administered 2012-01-21: 10 meq via INTRAVENOUS
  Filled 2012-01-21: qty 100

## 2012-01-21 MED ORDER — POTASSIUM CHLORIDE 10 MEQ/50ML IV SOLN
10.0000 meq | INTRAVENOUS | Status: DC
  Administered 2012-01-21 (×2): 10 meq via INTRAVENOUS

## 2012-01-21 MED ORDER — PRO-STAT SUGAR FREE PO LIQD
30.0000 mL | Freq: Four times a day (QID) | ORAL | Status: DC
  Administered 2012-01-21 – 2012-01-29 (×27): 30 mL
  Filled 2012-01-21 (×36): qty 30

## 2012-01-21 MED ORDER — POTASSIUM CHLORIDE 20 MEQ/15ML (10%) PO LIQD
40.0000 meq | Freq: Every day | ORAL | Status: AC
  Administered 2012-01-21: 40 meq
  Filled 2012-01-21: qty 30

## 2012-01-21 NOTE — Progress Notes (Addendum)
Clinical Social Worker went to [place]; pt currently unable to participate in conversation with CSW.  CSW staffed case with RN who indicated that dtr had been at bedside throughout the day.  Per RN, dtr was upset that the whereabouts of pt's dentures is unknown.  Per Unit Director, Dtr had lengthy discussion with Unit Director regarding dentures.  CSW to follow up with CSW Chiropodist. Service Excellence made aware of situation.  CSW phoned pt's dtr and left message offering support; CSW to continue to follow and assist as needed.   Angelia Mould, MSW, [place] [phone]

## 2012-01-21 NOTE — Progress Notes (Signed)
Nutrition Follow-up  Pt intubated, transferred to 2300. S/p bronchoscopy procedure 4/1. OGT in place. Current Propofol infusion at 2.9 ml/hr providing 77 fat kcals. RD consult for EN initiation and management.  Diet Order:  NPO  Meds: Scheduled Meds:   . albuterol-ipratropium  6 puff Inhalation Q6H  . antiseptic oral rinse  15 mL Mouth Rinse QID  . aspirin EC  162 mg Oral Daily  . chlorhexidine  15 mL Mouth Rinse BID  . cloNIDine  0.1 mg Per Tube BID  . enoxaparin (LOVENOX) injection  30 mg Subcutaneous Q24H  . fentaNYL  100 mcg Intravenous Once  . Gerhardt's butt cream   Topical BID  . insulin aspart  0-15 Units Subcutaneous Q4H  . midazolam  2 mg Intravenous Once  . midazolam  2 mg Intravenous Once  . pantoprazole sodium  40 mg Per Tube Q1200  . piperacillin-tazobactam (ZOSYN)  IV  3.375 g Intravenous Q8H  . potassium chloride  40 mEq Per Tube Daily  . vancomycin  1,000 mg Intravenous Q24H  . DISCONTD: potassium chloride  10 mEq Intravenous Q1 Hr x 4  . DISCONTD: potassium chloride  10 mEq Intravenous Q1 Hr x 4   Continuous Infusions:   . sodium chloride 13 mL/hr at 01/19/12 1130  . dextrose 5 % and 0.45% NaCl 50 mL/hr at 01/20/12 0100  . fentaNYL infusion INTRAVENOUS 100 mcg/hr (01/20/12 1900)  . midazolam (VERSED) infusion 2 mg/hr (01/19/12 0414)  . propofol 5 mcg/kg/min (01/21/12 0700)   PRN Meds:.acetaminophen (TYLENOL) oral liquid 160 mg/5 mL, albuterol, fentaNYL, midazolam, morphine injection  Labs:  CMP     Component Value Date/Time   NA 137 01/21/2012 0440   K 3.5 01/21/2012 0440   CL 102 01/21/2012 0440   CO2 26 01/21/2012 0440   GLUCOSE 93 01/21/2012 0440   BUN 13 01/21/2012 0440   CREATININE 2.65* 01/21/2012 0440   CALCIUM 8.7 01/21/2012 0440   PROT 5.9* 01/17/2012 0425   ALBUMIN 2.1* 01/17/2012 0425   AST 30 01/17/2012 0425   ALT 42* 01/17/2012 0425   ALKPHOS 158* 01/17/2012 0425   BILITOT 0.2* 01/17/2012 0425   GFRNONAA 18* 01/21/2012 0440   GFRAA 21* 01/21/2012 0440      Intake/Output Summary (Last 24 hours) at 01/21/12 0949 Last data filed at 01/21/12 0700  Gross per 24 hour  Intake 2106.18 ml  Output   1515 ml  Net 591.18 ml    CBG (last 3)   Basename 01/21/12 0814 01/21/12 0331 01/20/12 2343  GLUCAP 97 89 83    Weight Status:  96.9 kg (3/28) -- no new weight available  Re-estimated needs:  1650 kcals, 105-115 gm protein  Nutrition Dx:  Inadequate Oral Intake now r/t inability to eat as evidenced by NPO status, ongoing  Goal:  EN to provide 60-70% of estimated calorie needs (22-25 kcals/kg ideal body weight) and 100% of estimated protein needs, based on ASPEN guidelines for permissive underfeeding in critically ill obese individuals, currently unmet Monitor: EN tolerance, respiratory status, weight, labs, I/O's  Intervention:    Initiate Promote at 15 ml/hr and increase by 10 ml every 4 hours to goal rate of 35 mll/hr with Prostat liquid protein 30 ml QID via tube to provide 1128 total kcals (68% of estimated kcal needs), 113 gm protein (100% of estimated protein needs), 705 ml of free water  RD to follow for nutrition care plan   Alger Memos Pager #:  [phone]

## 2012-01-21 NOTE — Progress Notes (Signed)
Name: Emily Harrington MRN: [id number] DOB: [birth year]    LOS: 13  PCCM FOLLOW UP NOTE  History of Present Illness: 61 year old female presented 3/20 with strep bacteremia ,PEA arrest requiring CPR was intubated enroute to the ED. Extubated 3/23 and moved to SDU, PCCM signed off 3/27.   Cont to have difficulty with SOB and pain management and 3/30 developed worsening lethargy and resp distress and reintubated for hypercarbic resp failure ? Narcotic induced  Lines / Drains: L [place] TLC 3/20>>> 3/24 R femoral a-line 3/20>>> 3/22 ET tube 3/20>> 3/23 ETT 3/30>>>  Cultures: Blood 3/20 >> 1/2 pneumococcus Sputum 3/20>>> few E coli pan sensitive Urine 3/20>>> NEG cdiff  Neg 3/22 Sputum 3/30>>>candida albicans FOB BAL 4/1>>>  Antibiotics: Vancomycin 3/20>>> 3/23, 3/30 (clinical worsening)>> Zosyn 3/20>>> 3/23, 3/30 (clinical worsening ) >> Levofloxa 3/21 >> 3/30  Tests / Events: Cardiac arrest in the ED 3/20 3/20 ct abdo/pelvis- neg acute 3/20 Ct chest -Large area of airspace consolidation involving the right upper<BR>lobe most likely represents bacterial pneumonia. This will need<BR>interval radiographic followup to confirm resolution and to exclude<BR>an underlying neoplastic process which may have a similar<BR>appearance 3/20- EGDT 3/21 off pressors 3/30 reintubated  SUBJ: Remains easily agitated on fentanyl and propofol.  Improving CXR post bronch.  Vital Signs: BP 134/41  Pulse 103  Temp(Src) 98.1 F (36.7 C) (Axillary)  Resp 17  Ht 5\' 5"  (1.651 m)  Wt 96.9 kg (213 lb 10 oz)  BMI 35.55 kg/m2  SpO2 99%  Intake/Output Summary (Last 24 hours) at 01/21/12 0820 Last data filed at 01/21/12 0700  Gross per 24 hour  Intake 2156.18 ml  Output   1575 ml  Net 581.18 ml   Physical Examination: General:  Chronically ill female NAD  Neuro: awake, easily agitated on fent/propofol, MAE, gen weakness HEENT:  Mm dry, no jvd, ETT Cardiovascular:  RRR, Nl S1/S2, -M/R/G mild tachy    Lungs: resps even non labored on vent, diminished R>L, exp wheeze  Abdomen:  Soft, NT, ND and +BS. Musculoskeletal:  -edema  Skin:  Multiple scars, erythematous area over genitals, inner thighs.  Labs and Imaging:   CBC    Component Value Date/Time   WBC 16.2* 01/21/2012 0440   RBC 2.85* 01/21/2012 0440   HGB 8.2* 01/21/2012 0440   HCT 25.2* 01/21/2012 0440   PLT 404* 01/21/2012 0440   MCV 88.4 01/21/2012 0440   MCH 28.8 01/21/2012 0440   MCHC 32.5 01/21/2012 0440   RDW 16.3* 01/21/2012 0440   LYMPHSABS 1.0 01/11/2012 0340   MONOABS 0.7 01/11/2012 0340   EOSABS 0.0 01/11/2012 0340   BASOSABS 0.0 01/11/2012 0340   BMET    Component Value Date/Time   NA 137 01/21/2012 0440   K 3.5 01/21/2012 0440   CL 102 01/21/2012 0440   CO2 26 01/21/2012 0440   GLUCOSE 93 01/21/2012 0440   BUN 13 01/21/2012 0440   CREATININE 2.65* 01/21/2012 0440   CALCIUM 8.7 01/21/2012 0440   GFRNONAA 18* 01/21/2012 0440   GFRAA 21* 01/21/2012 0440   Assessment and Plan: 61 yead old female with PMH of asthma who presents to the hospital after being found SOB and unresponsive. Patient likely has a RUL PNA vs mass. Intubated 3/23-3/25.  Developed worsening resp distress 3/30 and tx to ICU.   Acute hypoxic resp failure - in setting RUL PNA v mass, underlying asthma and significant atelectasis/collapse in setting mult rib/sternal fx with pain and splinting + narcotics ?  Worsening R  atx/collapse on CXR. PLAN -  Maintain on PS as tolerated. FOB done, brushing and BAL from RUL and RLL performed and will f/u culture and cytology results. F/u CXR Will need adequate pain control once extubated to avoid further mucous plugging.  RUL PNA -- v mass.  Ecoli in previous sputum  Lab 01/21/12 0440 01/20/12 0425 01/19/12 0411 01/18/12 0500 01/17/12 0425  WBC 16.2* 21.5* 20.8* 24.2* 23.5*  PLAN -  Broadened abx for now to cover HCAP with increased WBC and worsening atx. Repeat sputum culture pending from BAL from 4/1. F/U on brush and BAL  cytology.  Agitation - Use propofol gtt & fent  Prefer to avoid versed due to severe delirium, will add seroquel today to try and decrease propofol need.  Acute renal failure -- non oliguric.  SCr trending back up. Renal u/s negative except for non obstructing bladder stone.  Cr is improving.   Lab 01/21/12 0440 01/20/12 0425 01/19/12 0411 01/18/12 0500 01/17/12 0425  CREATININE 2.65* 2.77* 2.56* 2.57* 2.75*  PLAN -  F/u chem  KVO IVF No diureses Replace K  Rib fx/ pain  PLAN -  Fent gtt  for now Will need pain control and aggressive pulm hygiene when extubated  HTN --  PLAN -  Low dose clonidine 4/1, may also help some with agitation   Prophylaxis - lovenox, protonix  CC time 35 minutes.  Koren Bound, M.D. [phone]

## 2012-01-21 NOTE — Progress Notes (Signed)
eLink Physician-Brief Progress Note Patient Name: Emily Harrington DOB: 12/20/1950 MRN: [id number]  Date of Service  01/21/2012   HPI/Events of Note     eICU Interventions  Hypokalemia, repleted    Intervention Category Intermediate Interventions: Electrolyte abnormality - evaluation and management  Emily Harrington 01/21/2012, 5:35 AM

## 2012-01-21 NOTE — Progress Notes (Signed)
ANTIBIOTIC CONSULT NOTE - FOLLOW UP  Pharmacy Consult for Vancomycin/Zosyn Indication: HCAP/worsening clinical status  Allergies  Allergen Reactions  . Nsaids     Patient Measurements: Height: 5\' 5"  (165.1 cm) Weight: 213 lb 10 oz (96.9 kg) IBW/kg (Calculated) : 57   Vital Signs: Temp: 98.1 F (36.7 C) (04/02 0817) Temp src: Axillary (04/02 0817) BP: 150/80 mmHg (04/02 0940) Pulse Rate: 113  (04/02 0940) Intake/Output from previous day: 04/01 0701 - 04/02 0700 In: 2206.2 [I.V.:1536.2; NG/GT:220; IV Piggyback:450] Out: 1635 [Urine:1585; Emesis/NG output:50] Intake/Output from this shift:    Labs:  Basename 01/21/12 0440 01/20/12 0425 01/19/12 0411  WBC 16.2* 21.5* 20.8*  HGB 8.2* 8.7* 8.8*  PLT 404* 425* 411*  LABCREA -- -- --  CREATININE 2.65* 2.77* 2.56*   Estimated Creatinine Clearance: 25.4 ml/min (by C-G formula based on Cr of 2.65). No results found for this basename: VANCOTROUGH:2,VANCOPEAK:2,VANCORANDOM:2,GENTTROUGH:2,GENTPEAK:2,GENTRANDOM:2,TOBRATROUGH:2,TOBRAPEAK:2,TOBRARND:2,AMIKACINPEAK:2,AMIKACINTROU:2,AMIKACIN:2, in the last 72 hours   Microbiology: Recent Results (from the past 720 hour(s))  CULTURE, RESPIRATORY     Status: Normal   Collection Time   01/08/12  4:27 PM      Component Value Range Status Comment   Specimen Description ENDOTRACHEAL   Final    Special Requests NONE   Final    Gram Stain     Final    Value: FEW WBC PRESENT,BOTH PMN AND MONONUCLEAR     RARE SQUAMOUS EPITHELIAL CELLS PRESENT     FEW GRAM POSITIVE COCCI IN PAIRS     RARE GRAM POSITIVE RODS     RARE GRAM NEGATIVE RODS   Culture FEW ESCHERICHIA COLI   Final    Report Status 01/11/2012 FINAL   Final    Organism ID, Bacteria ESCHERICHIA COLI   Final   MRSA PCR SCREENING     Status: Normal   Collection Time   01/08/12  5:48 PM      Component Value Range Status Comment   MRSA by PCR NEGATIVE  NEGATIVE  Final   CULTURE, BLOOD (ROUTINE X 2)     Status: Normal   Collection  Time   01/08/12  5:50 PM      Component Value Range Status Comment   Specimen Description BLOOD ARM RIGHT   Final    Special Requests BOTTLES DRAWN AEROBIC AND ANAEROBIC 10CC   Final    Culture  Setup Time [id number]   Final    Culture     Final    Value: STREPTOCOCCUS PNEUMONIAE     Note: Gram Stain Report Called to,Read Back By and Verified With: JULIANNA CLAIR @ 1450 01/09/12 WICKN   Report Status 01/11/2012 FINAL   Final    Organism ID, Bacteria STREPTOCOCCUS PNEUMONIAE   Final   CULTURE, BLOOD (ROUTINE X 2)     Status: Normal   Collection Time   01/08/12  6:00 PM      Component Value Range Status Comment   Specimen Description BLOOD ARM LEFT   Final    Special Requests BOTTLES DRAWN AEROBIC ONLY 10CC   Final    Culture  Setup Time [id number]   Final    Culture NO GROWTH 5 DAYS   Final    Report Status 01/15/2012 FINAL   Final   URINE CULTURE     Status: Normal   Collection Time   01/08/12  8:46 PM      Component Value Range Status Comment   Specimen Description URINE, CATHETERIZED   Final    Special Requests NONE  Final    Culture  Setup Time [id number]   Final    Colony Count NO GROWTH   Final    Culture NO GROWTH   Final    Report Status 01/09/2012 FINAL   Final   CLOSTRIDIUM DIFFICILE BY PCR     Status: Normal   Collection Time   01/10/12  3:51 AM      Component Value Range Status Comment   C difficile by pcr NEGATIVE  NEGATIVE  Final   CULTURE, RESPIRATORY     Status: Normal   Collection Time   01/18/12  1:17 PM      Component Value Range Status Comment   Specimen Description TRACHEAL ASPIRATE   Final    Special Requests NONE   Final    Gram Stain     Final    Value: FEW WBC PRESENT, PREDOMINANTLY PMN     FEW SQUAMOUS EPITHELIAL CELLS PRESENT     RARE GRAM POSITIVE COCCI IN PAIRS     RARE GRAM VARIABLE ROD   Culture FEW CANDIDA ALBICANS   Final    Report Status 01/20/2012 FINAL   Final   MRSA PCR SCREENING     Status: Normal   Collection Time   01/18/12   1:22 PM      Component Value Range Status Comment   MRSA by PCR NEGATIVE  NEGATIVE  Final   CULTURE, BAL-QUANTITATIVE     Status: Normal (Preliminary result)   Collection Time   01/20/12  2:02 PM      Component Value Range Status Comment   Specimen Description BRONCHIAL WASHINGS   Final    Special Requests RLL   Final    Gram Stain     Final    Value: FEW WBC PRESENT,BOTH PMN AND MONONUCLEAR     NO SQUAMOUS EPITHELIAL CELLS SEEN     NO ORGANISMS SEEN   Colony Count PENDING   Incomplete    Culture NO GROWTH   Final    Report Status PENDING   Incomplete   CULTURE, RESPIRATORY     Status: Normal (Preliminary result)   Collection Time   01/20/12  2:02 PM      Component Value Range Status Comment   Specimen Description BRONCHIAL WASHINGS   Final    Special Requests RLL   Final    Gram Stain     Final    Value: FEW WBC PRESENT,BOTH PMN AND MONONUCLEAR     NO SQUAMOUS EPITHELIAL CELLS SEEN     NO ORGANISMS SEEN   Culture NO GROWTH   Final    Report Status PENDING   Incomplete     Anti-infectives     Start     Dose/Rate Route Frequency Ordered Stop   01/18/12 1300   vancomycin (VANCOCIN) IVPB 1000 mg/200 mL premix        1,000 mg 200 mL/hr over 60 Minutes Intravenous Every 24 hours 01/18/12 1215     01/18/12 1300  piperacillin-tazobactam (ZOSYN) IVPB 3.375 g       3.375 g 12.5 mL/hr over 240 Minutes Intravenous Every 8 hours 01/18/12 1215     01/13/12 1000   levofloxacin (LEVAQUIN) tablet 250 mg  Status:  Discontinued        250 mg Oral Daily 01/12/12 1150 01/18/12 1145   01/12/12 1200   levofloxacin (LEVAQUIN) tablet 500 mg        500 mg Oral  Once 01/12/12 1135 01/12/12 1250   01/12/12 1200  levofloxacin (LEVAQUIN) tablet 250 mg  Status:  Discontinued        250 mg Oral Daily 01/12/12 1149 01/12/12 1150   01/09/12 1100   Levofloxacin (LEVAQUIN) IVPB 750 mg  Status:  Discontinued        750 mg 100 mL/hr over 90 Minutes Intravenous Every 48 hours 01/09/12 1028 01/12/12 1133     01/09/12 1000   vancomycin (VANCOCIN) IVPB 1000 mg/200 mL premix  Status:  Discontinued        1,000 mg 200 mL/hr over 60 Minutes Intravenous Every 24 hours 01/09/12 1403 01/11/12 0956   01/08/12 2300   piperacillin-tazobactam (ZOSYN) IVPB 3.375 g  Status:  Discontinued        3.375 g 12.5 mL/hr over 240 Minutes Intravenous Every 8 hours 01/08/12 1659 01/11/12 0956   01/08/12 1800   vancomycin (VANCOCIN) 1,500 mg in sodium chloride 0.9 % 500 mL IVPB  Status:  Discontinued        1,500 mg 250 mL/hr over 120 Minutes Intravenous Every 12 hours 01/08/12 1659 01/09/12 1403   01/08/12 1700  piperacillin-tazobactam (ZOSYN) IVPB 3.375 g       3.375 g 100 mL/hr over 30 Minutes Intravenous  Once 01/08/12 1659 01/08/12 2043          Assessment: 61 yo F on vancomycin and Zosyn day #4 for possible HCAP and worsening clinical status.  BAL performed yesterday and NGTD.  Renal function continues to slowly improve.  Vancomycin dose hung ~3.5 hours late yesterday, therefore will hold off on vancomycin trough until tomorrow if vancomycin is to continue.  Goal of Therapy:  Vancomycin trough level 15-20 mcg/ml  Plan:  1.  Continue Zosyn 3.375 gm IV q8hr (each dose over 4 hours) 2.  Continue Vancomycin 1000 mg IV q 24hr (plan for trough 4/3) 3.  F/U renal function, cultures  Rolland Porter, Pharm.D., BCPS Clinical Pharmacist Pager: [phone]

## 2012-01-22 ENCOUNTER — Inpatient Hospital Stay (HOSPITAL_COMMUNITY): Payer: Medicaid Other

## 2012-01-22 DIAGNOSIS — S2220XA Unspecified fracture of sternum, initial encounter for closed fracture: Secondary | ICD-10-CM

## 2012-01-22 DIAGNOSIS — I10 Essential (primary) hypertension: Secondary | ICD-10-CM

## 2012-01-22 LAB — GLUCOSE, CAPILLARY
Glucose-Capillary: 104 mg/dL — ABNORMAL HIGH (ref 70–99)
Glucose-Capillary: 108 mg/dL — ABNORMAL HIGH (ref 70–99)
Glucose-Capillary: 113 mg/dL — ABNORMAL HIGH (ref 70–99)
Glucose-Capillary: 117 mg/dL — ABNORMAL HIGH (ref 70–99)
Glucose-Capillary: 87 mg/dL (ref 70–99)

## 2012-01-22 LAB — BASIC METABOLIC PANEL
BUN: 14 mg/dL (ref 6–23)
CO2: 26 mEq/L (ref 19–32)
Calcium: 9 mg/dL (ref 8.4–10.5)
Chloride: 104 mEq/L (ref 96–112)
Creatinine, Ser: 2.45 mg/dL — ABNORMAL HIGH (ref 0.50–1.10)
GFR calc Af Amer: 23 mL/min — ABNORMAL LOW (ref >90)

## 2012-01-22 LAB — CBC
HCT: 26.9 % — ABNORMAL LOW (ref 36.0–46.0)
MCH: 27.8 pg (ref 26.0–34.0)
MCV: 87.9 fL (ref 78.0–100.0)
Platelets: 459 10*3/uL — ABNORMAL HIGH (ref 150–400)
RDW: 16.3 % — ABNORMAL HIGH (ref 11.5–15.5)
WBC: 15.6 10*3/uL — ABNORMAL HIGH (ref 4.0–10.5)

## 2012-01-22 LAB — POCT I-STAT 3, ART BLOOD GAS (G3+)
Patient temperature: 99.2
pCO2 arterial: 55.5 mmHg — ABNORMAL HIGH (ref 35.0–45.0)
pH, Arterial: 7.285 — ABNORMAL LOW (ref 7.350–7.400)

## 2012-01-22 LAB — CULTURE, RESPIRATORY W GRAM STAIN

## 2012-01-22 MED ORDER — POTASSIUM CHLORIDE 20 MEQ/15ML (10%) PO LIQD
40.0000 meq | Freq: Three times a day (TID) | ORAL | Status: AC
  Administered 2012-01-22 (×2): 40 meq
  Filled 2012-01-22 (×2): qty 30

## 2012-01-22 MED ORDER — VANCOMYCIN HCL 1000 MG IV SOLR
750.0000 mg | INTRAVENOUS | Status: DC
  Administered 2012-01-22: 750 mg via INTRAVENOUS
  Filled 2012-01-22 (×2): qty 750

## 2012-01-22 MED ORDER — FUROSEMIDE 10 MG/ML IJ SOLN
40.0000 mg | Freq: Four times a day (QID) | INTRAMUSCULAR | Status: AC
  Administered 2012-01-22 (×3): 40 mg via INTRAVENOUS
  Filled 2012-01-22 (×3): qty 4

## 2012-01-22 MED ORDER — NYSTATIN 100000 UNIT/GM EX POWD
Freq: Three times a day (TID) | CUTANEOUS | Status: DC
  Administered 2012-01-22 – 2012-02-14 (×69): via TOPICAL
  Filled 2012-01-22 (×5): qty 15

## 2012-01-22 NOTE — Consult Note (Signed)
WOC consult Note Reason for Consult: rash on buttocks and perineum.  However upon my assessment today this pt has rash covering large part of trunk, in the skin fold of her neck, on her thighs both anterior and posterior, across her shoulders.  RN at bedside today thinks buttock erythema may be improved. But this area also has scattered lesions with pustules.  The buttock area also has some skin sloughing.   Wound type:Rash with unclear etiology, Vancomycin started on 3/3/013-initially concerned for Red Man's but rash was documented prior to this drug initiation.  Discussed with bedside nursing latex allergy, however FC was placed 01/02/12 and rash not documented in record until 01/17/12.  Many of the areas due have clinical presentation like candida, therefore IV Diflucan may be helpful.  I will start InterDry Ag+ for skin folds to see if this will improve status of the intertriginous skin folds under the pannus and of the neck.  The buttocks do not present with any intensity noted in skin folds.  Will request staff to use antifungal to buttocks and perineal area (has orders for Gerhardt's butt paste)  Will follow up on use of InterDry Ag+ in a few days to see if improvement in these areas. Thanks  Emily Harrington [employer], CWOCN [phone])

## 2012-01-22 NOTE — Progress Notes (Signed)
Name: Emily Harrington MRN: [id number] DOB: 1951/01/31    LOS: 14  PCCM FOLLOW UP NOTE  History of Present Illness: 61 year old female presented 3/20 with strep bacteremia ,PEA arrest requiring CPR was intubated enroute to the ED. Extubated 3/23 and moved to SDU, PCCM signed off 3/27.   Cont to have difficulty with SOB and pain management and 3/30 developed worsening lethargy and resp distress and reintubated for hypercarbic resp failure ? Narcotic induced  Lines / Drains: L [place] TLC 3/20>>> 3/24 R femoral a-line 3/20>>> 3/22 ET tube 3/20>> 3/23 ETT 3/30>>>  Cultures: Blood 3/20 >> 1/2 pneumococcus Sputum 3/20>>> few E coli pan sensitive Urine 3/20>>> NEG cdiff  Neg 3/22 Sputum 3/30>>>candida albicans FOB BAL 4/1>>>  Antibiotics: Vancomycin 3/20>>> 3/23, 3/30 (clinical worsening)>> Zosyn 3/20>>> 3/23, 3/30 (clinical worsening ) >> Levofloxa 3/21 >> 3/30  Tests / Events: Cardiac arrest in the ED 3/20 3/20 ct abdo/pelvis- neg acute 3/20 Ct chest -Large area of airspace consolidation involving the right upper<BR>lobe most likely represents bacterial pneumonia. This will need<BR>interval radiographic followup to confirm resolution and to exclude<BR>an underlying neoplastic process which may have a similar<BR>appearance 3/20- EGDT 3/21 off pressors 3/30 reintubated 4/1 Brush and BAL cytology negative for malignancy.  SUBJ: Remains easily agitated on fentanyl and propofol.  Improving CXR post bronch.  Vital Signs: BP 124/36  Pulse 100  Temp(Src) 98.6 F (37 C) (Axillary)  Resp 16  Ht 5\' 5"  (1.651 m)  Wt 99.5 kg (219 lb 5.7 oz)  BMI 36.50 kg/m2  SpO2 99%  Intake/Output Summary (Last 24 hours) at 01/22/12 4098 Last data filed at 01/22/12 0800  Gross per 24 hour  Intake 2715.2 ml  Output   1645 ml  Net 1070.2 ml   Physical Examination: General:  Chronically ill female NAD erythematous rash on chest and torso. Neuro: awake, easily agitated on fent/propofol, MAE, gen  weakness HEENT:  Mm dry, no jvd, ETT Cardiovascular:  RRR, Nl S1/S2, -M/R/G mild tachy  Lungs: resps even non labored on vent, diminished R>L, exp wheeze  Abdomen:  Soft, NT, ND and +BS. Musculoskeletal:  -edema  Skin:  Multiple scars, erythematous area over genitals, inner thighs.  Labs and Imaging:   CBC    Component Value Date/Time   WBC 15.6* 01/22/2012 0530   RBC 3.06* 01/22/2012 0530   HGB 8.5* 01/22/2012 0530   HCT 26.9* 01/22/2012 0530   PLT 459* 01/22/2012 0530   MCV 87.9 01/22/2012 0530   MCH 27.8 01/22/2012 0530   MCHC 31.6 01/22/2012 0530   RDW 16.3* 01/22/2012 0530   LYMPHSABS 1.0 01/11/2012 0340   MONOABS 0.7 01/11/2012 0340   EOSABS 0.0 01/11/2012 0340   BASOSABS 0.0 01/11/2012 0340   BMET    Component Value Date/Time   NA 138 01/22/2012 0530   K 4.2 01/22/2012 0530   CL 104 01/22/2012 0530   CO2 26 01/22/2012 0530   GLUCOSE 111* 01/22/2012 0530   BUN 14 01/22/2012 0530   CREATININE 2.45* 01/22/2012 0530   CALCIUM 9.0 01/22/2012 0530   GFRNONAA 20* 01/22/2012 0530   GFRAA 23* 01/22/2012 0530   Assessment and Plan: 2 yead old female with PMH of asthma who presents to the hospital after being found SOB and unresponsive. Patient likely has a RUL PNA vs mass. Intubated 3/23-3/25.  Developed worsening resp distress 3/30 and tx to ICU.   Acute hypoxic resp failure - in setting RUL PNA v mass, underlying asthma and significant atelectasis/collapse in setting mult  rib/sternal fx with pain and splinting + narcotics ?  Worsening R atx/collapse on CXR. PLAN -  Maintain on PS as tolerated with SBT today and in AM. FOB done, brushing and BAL from RUL and RLL performed cytology negative but cultures pending. F/u CXR Will need adequate pain control once extubated to avoid further mucous plugging. Diureses today.  RUL PNA -- v mass.  Ecoli in previous sputum  Lab 01/22/12 0530 01/21/12 0440 01/20/12 0425 01/19/12 0411 01/18/12 0500  WBC 15.6* 16.2* 21.5* 20.8* 24.2*  PLAN -  Broadened abx for now to  cover HCAP with increased WBC and worsening atx. Repeat sputum culture pending from BAL from 4/1 but negative so far. Brush and BAL cytology negative for malignancy.  Agitation - Use propofol gtt & fent  Prefer to avoid versed due to severe delirium, will add seroquel today to try and decrease propofol need.  Acute renal failure -- non oliguric.  SCr trending back up. Renal u/s negative except for non obstructing bladder stone.  Cr is improving.   Lab 01/22/12 0530 01/21/12 0440 01/20/12 0425 01/19/12 0411 01/18/12 0500  CREATININE 2.45* 2.65* 2.77* 2.56* 2.57*  PLAN -  F/u chem  KVO IVF Diureses today Replace K  Rib fx/ pain  PLAN -  Fent gtt  for now Will need pain control and aggressive pulm hygiene when extubated  HTN --  PLAN -  Low dose clonidine 4/1, may also help some with agitation   Rash: ?fungal, will start topical anti-fungal.  Prophylaxis - lovenox, protonix  CC time 35 minutes.  Koren Bound, M.D. [phone]

## 2012-01-22 NOTE — Progress Notes (Signed)
Physical Therapy Note  Spoke with RN concerning whether pt appropriate for continued mobilization with PT. For right now I will hold but await clarification from MD. Will f/u later today.  Ivonne Andrew PT, DPT  Pager: [phone]

## 2012-01-22 NOTE — Progress Notes (Signed)
ANTIBIOTIC CONSULT NOTE - FOLLOW UP  Pharmacy Consult for Vancomycin/Zosyn Indication: HCAP/worsening clinical status  Allergies  Allergen Reactions  . Nsaids     Patient Measurements: Height: 5\' 5"  (165.1 cm) Weight: 219 lb 5.7 oz (99.5 kg) IBW/kg (Calculated) : 57   Vital Signs: Temp: 98.5 F (36.9 C) (04/03 1205) Temp src: Oral (04/03 1205) BP: 126/46 mmHg (04/03 1300) Pulse Rate: 100  (04/03 1300) Intake/Output from previous day: 04/02 0701 - 04/03 0700 In: 2702.3 [I.V.:1682.3; NG/GT:660; IV Piggyback:360] Out: 1610 [Urine:1610] Intake/Output from this shift: Total I/O In: 799.8 [I.V.:375.8; NG/GT:370; IV Piggyback:54] Out: 1000 [Urine:1000]  Labs:  [facility] 01/22/12 0530 01/21/12 0440 01/20/12 0425  WBC 15.6* 16.2* 21.5*  HGB 8.5* 8.2* 8.7*  PLT 459* 404* 425*  LABCREA -- -- --  CREATININE 2.45* 2.65* 2.77*   Estimated Creatinine Clearance: 27.8 ml/min (by C-G formula based on Cr of 2.45).  Basename 01/22/12 1200  VANCOTROUGH 25.6*  VANCOPEAK --  Drue Dun --  GENTTROUGH --  GENTPEAK --  GENTRANDOM --  TOBRATROUGH --  TOBRAPEAK --  TOBRARND --  AMIKACINPEAK --  AMIKACINTROU --  AMIKACIN --     Microbiology: Recent Results (from the past 720 hour(s))  CULTURE, RESPIRATORY     Status: Normal   Collection Time   01/08/12  4:27 PM      Component Value Range Status Comment   Specimen Description ENDOTRACHEAL   Final    Special Requests NONE   Final    Gram Stain     Final    Value: FEW WBC PRESENT,BOTH PMN AND MONONUCLEAR     RARE SQUAMOUS EPITHELIAL CELLS PRESENT     FEW GRAM POSITIVE COCCI IN PAIRS     RARE GRAM POSITIVE RODS     RARE GRAM NEGATIVE RODS   Culture FEW ESCHERICHIA COLI   Final    Report Status 01/11/2012 FINAL   Final    Organism ID, Bacteria ESCHERICHIA COLI   Final   MRSA PCR SCREENING     Status: Normal   Collection Time   01/08/12  5:48 PM      Component Value Range Status Comment   MRSA by PCR NEGATIVE  NEGATIVE  Final    CULTURE, BLOOD (ROUTINE X 2)     Status: Normal   Collection Time   01/08/12  5:50 PM      Component Value Range Status Comment   Specimen Description BLOOD ARM RIGHT   Final    Special Requests BOTTLES DRAWN AEROBIC AND ANAEROBIC 10CC   Final    Culture  Setup Time [id number]   Final    Culture     Final    Value: STREPTOCOCCUS PNEUMONIAE     Note: Gram Stain Report Called to,Read Back By and Verified With: JULIANNA CLAIR @ 1450 01/09/12 WICKN   Report Status 01/11/2012 FINAL   Final    Organism ID, Bacteria STREPTOCOCCUS PNEUMONIAE   Final   CULTURE, BLOOD (ROUTINE X 2)     Status: Normal   Collection Time   01/08/12  6:00 PM      Component Value Range Status Comment   Specimen Description BLOOD ARM LEFT   Final    Special Requests BOTTLES DRAWN AEROBIC ONLY 10CC   Final    Culture  Setup Time [id number]   Final    Culture NO GROWTH 5 DAYS   Final    Report Status 01/15/2012 FINAL   Final   URINE CULTURE  Status: Normal   Collection Time   01/08/12  8:46 PM      Component Value Range Status Comment   Specimen Description URINE, CATHETERIZED   Final    Special Requests NONE   Final    Culture  Setup Time [id number]   Final    Colony Count NO GROWTH   Final    Culture NO GROWTH   Final    Report Status 01/09/2012 FINAL   Final   CLOSTRIDIUM DIFFICILE BY PCR     Status: Normal   Collection Time   01/10/12  3:51 AM      Component Value Range Status Comment   C difficile by pcr NEGATIVE  NEGATIVE  Final   CULTURE, RESPIRATORY     Status: Normal   Collection Time   01/18/12  1:17 PM      Component Value Range Status Comment   Specimen Description TRACHEAL ASPIRATE   Final    Special Requests NONE   Final    Gram Stain     Final    Value: FEW WBC PRESENT, PREDOMINANTLY PMN     FEW SQUAMOUS EPITHELIAL CELLS PRESENT     RARE GRAM POSITIVE COCCI IN PAIRS     RARE GRAM VARIABLE ROD   Culture FEW CANDIDA ALBICANS   Final    Report Status 01/20/2012 FINAL   Final   MRSA  PCR SCREENING     Status: Normal   Collection Time   01/18/12  1:22 PM      Component Value Range Status Comment   MRSA by PCR NEGATIVE  NEGATIVE  Final   AFB CULTURE WITH SMEAR     Status: Normal (Preliminary result)   Collection Time   01/20/12  1:59 PM      Component Value Range Status Comment   Specimen Description BRONCHIAL WASHINGS   Final    Special Requests RLL   Final    ACID FAST SMEAR NO ACID FAST BACILLI SEEN   Final    Culture     Final    Value: CULTURE WILL BE EXAMINED FOR 6 WEEKS BEFORE ISSUING A FINAL REPORT   Report Status PENDING   Incomplete   FUNGUS CULTURE W SMEAR     Status: Normal (Preliminary result)   Collection Time   01/20/12  1:59 PM      Component Value Range Status Comment   Specimen Description BRONCHIAL WASHINGS   Final    Special Requests RLL   Final    Fungal Smear NO YEAST OR FUNGAL ELEMENTS SEEN   Final    Culture CULTURE IN PROGRESS FOR FOUR WEEKS   Final    Report Status PENDING   Incomplete   LEGIONELLA CULTURE     Status: Normal (Preliminary result)   Collection Time   01/20/12  1:59 PM      Component Value Range Status Comment   Specimen Description BRONCHIAL WASHINGS   Final    Special Requests RLL   Final    Culture     Final    Value: NO LEGIONELLA ISOLATED, CULTURE IN PROGRESS FOR 5 DAYS   Report Status PENDING   Incomplete   CULTURE, BAL-QUANTITATIVE     Status: Normal (Preliminary result)   Collection Time   01/20/12  2:02 PM      Component Value Range Status Comment   Specimen Description BRONCHIAL WASHINGS   Final    Special Requests RLL   Final    Gram Stain  Final    Value: FEW WBC PRESENT,BOTH PMN AND MONONUCLEAR     NO SQUAMOUS EPITHELIAL CELLS SEEN     NO ORGANISMS SEEN   Colony Count PENDING   Incomplete    Culture NO GROWTH 2 DAYS   Final    Report Status PENDING   Incomplete   CULTURE, RESPIRATORY     Status: Normal   Collection Time   01/20/12  2:02 PM      Component Value Range Status Comment   Specimen Description  BRONCHIAL WASHINGS   Final    Special Requests RLL   Final    Gram Stain     Final    Value: FEW WBC PRESENT,BOTH PMN AND MONONUCLEAR     NO SQUAMOUS EPITHELIAL CELLS SEEN     NO ORGANISMS SEEN   Culture NO GROWTH 2 DAYS   Final    Report Status 01/22/2012 FINAL   Final   CLOSTRIDIUM DIFFICILE BY PCR     Status: Normal   Collection Time   01/21/12  8:46 PM      Component Value Range Status Comment   C difficile by pcr NEGATIVE  NEGATIVE  Final     Anti-infectives     Start     Dose/Rate Route Frequency Ordered Stop   01/22/12 1800   vancomycin (VANCOCIN) 750 mg in sodium chloride 0.9 % 150 mL IVPB        750 mg 150 mL/hr over 60 Minutes Intravenous Every 24 hours 01/22/12 1351     01/18/12 1300   vancomycin (VANCOCIN) IVPB 1000 mg/200 mL premix  Status:  Discontinued        1,000 mg 200 mL/hr over 60 Minutes Intravenous Every 24 hours 01/18/12 1215 01/22/12 1350   01/18/12 1300   piperacillin-tazobactam (ZOSYN) IVPB 3.375 g        3.375 g 12.5 mL/hr over 240 Minutes Intravenous Every 8 hours 01/18/12 1215     01/13/12 1000   levofloxacin (LEVAQUIN) tablet 250 mg  Status:  Discontinued        250 mg Oral Daily 01/12/12 1150 01/18/12 1145   01/12/12 1200   levofloxacin (LEVAQUIN) tablet 500 mg        500 mg Oral  Once 01/12/12 1135 01/12/12 1250   01/12/12 1200   levofloxacin (LEVAQUIN) tablet 250 mg  Status:  Discontinued        250 mg Oral Daily 01/12/12 1149 01/12/12 1150   01/09/12 1100   Levofloxacin (LEVAQUIN) IVPB 750 mg  Status:  Discontinued        750 mg 100 mL/hr over 90 Minutes Intravenous Every 48 hours 01/09/12 1028 01/12/12 1133   01/09/12 1000   vancomycin (VANCOCIN) IVPB 1000 mg/200 mL premix  Status:  Discontinued        1,000 mg 200 mL/hr over 60 Minutes Intravenous Every 24 hours 01/09/12 1403 01/11/12 0956   01/08/12 2300   piperacillin-tazobactam (ZOSYN) IVPB 3.375 g  Status:  Discontinued        3.375 g 12.5 mL/hr over 240 Minutes Intravenous Every  8 hours 01/08/12 1659 01/11/12 0956   01/08/12 1800   vancomycin (VANCOCIN) 1,500 mg in sodium chloride 0.9 % 500 mL IVPB  Status:  Discontinued        1,500 mg 250 mL/hr over 120 Minutes Intravenous Every 12 hours 01/08/12 1659 01/09/12 1403   01/08/12 1700   piperacillin-tazobactam (ZOSYN) IVPB 3.375 g        3.375 g 100  mL/hr over 30 Minutes Intravenous  Once 01/08/12 1659 01/08/12 2043          Assessment: 61 yo F on vancomycin and Zosyn day #5 for possible HCAP and worsening clinical status.  Cultures have not revealed anything.  Renal function continues to slowly improve.  Vancomycin trough at steady state today supratherapeutic.  Will decrease dose.  Goal of Therapy:  Vancomycin trough level 15-20 mcg/ml  Plan:  1.  Continue Zosyn 3.375 gm IV q8hr (each dose over 4 hours) 2.  Decrease vancomycin to 750 mg IV q24hr (will begin this evening) 3.  F/U renal function, cultures  Rolland Porter, Pharm.D., BCPS Clinical Pharmacist Pager: [phone]

## 2012-01-22 NOTE — Significant Event (Signed)
Pt had copious amount of loose/watery stools running to legs. Pt given a full bath. Sacrum/perineum/inner thighs/groins excoriated. MD Molli Knock made aware and new order for flexiseal to protect skin. Creams and powder applied to affected areas post bath. Flexiseal inserted without difficult. Stool returned through flexiseal. Will monitor. Cearra Portnoy, Charity fundraiser.

## 2012-01-23 ENCOUNTER — Inpatient Hospital Stay (HOSPITAL_COMMUNITY): Payer: Medicaid Other

## 2012-01-23 DIAGNOSIS — J398 Other specified diseases of upper respiratory tract: Secondary | ICD-10-CM

## 2012-01-23 LAB — BASIC METABOLIC PANEL
BUN: 22 mg/dL (ref 6–23)
Chloride: 102 mEq/L (ref 96–112)
Creatinine, Ser: 2.46 mg/dL — ABNORMAL HIGH (ref 0.50–1.10)
GFR calc Af Amer: 23 mL/min — ABNORMAL LOW (ref >90)

## 2012-01-23 LAB — CULTURE, BAL-QUANTITATIVE W GRAM STAIN: Colony Count: 200

## 2012-01-23 LAB — GLUCOSE, CAPILLARY
Glucose-Capillary: 102 mg/dL — ABNORMAL HIGH (ref 70–99)
Glucose-Capillary: 107 mg/dL — ABNORMAL HIGH (ref 70–99)
Glucose-Capillary: 107 mg/dL — ABNORMAL HIGH (ref 70–99)
Glucose-Capillary: 127 mg/dL — ABNORMAL HIGH (ref 70–99)

## 2012-01-23 LAB — CBC
HCT: 26.2 % — ABNORMAL LOW (ref 36.0–46.0)
MCHC: 31.7 g/dL (ref 30.0–36.0)
MCV: 87.9 fL (ref 78.0–100.0)
RDW: 16 % — ABNORMAL HIGH (ref 11.5–15.5)

## 2012-01-23 MED ORDER — PROPOFOL 10 MG/ML IV EMUL
5.0000 ug/kg/min | INTRAVENOUS | Status: DC
  Administered 2012-01-23: 50 ug/kg/min via INTRAVENOUS
  Administered 2012-01-24 (×2): 40 ug/kg/min via INTRAVENOUS
  Filled 2012-01-23 (×2): qty 100

## 2012-01-23 MED ORDER — HALOPERIDOL LACTATE 5 MG/ML IJ SOLN: 5.0000 mg | Freq: Once | INTRAMUSCULAR | Status: AC

## 2012-01-23 MED ORDER — FENTANYL CITRATE 0.05 MG/ML IJ SOLN
25.0000 ug | INTRAMUSCULAR | Status: DC | PRN
  Administered 2012-01-23 (×2): 50 ug via INTRAVENOUS
  Administered 2012-01-23 – 2012-01-24 (×2): 25 ug via INTRAVENOUS
  Administered 2012-01-24: 50 ug via INTRAVENOUS
  Administered 2012-01-24: 25 ug via INTRAVENOUS
  Administered 2012-01-24 – 2012-01-25 (×6): 50 ug via INTRAVENOUS
  Filled 2012-01-23 (×9): qty 2

## 2012-01-23 MED ORDER — ETOMIDATE 2 MG/ML IV SOLN
20.0000 mg | Freq: Once | INTRAVENOUS | Status: AC
  Administered 2012-01-23: 20 mg via INTRAVENOUS

## 2012-01-23 MED ORDER — METHYLPREDNISOLONE SODIUM SUCC 40 MG IJ SOLR
40.0000 mg | Freq: Two times a day (BID) | INTRAMUSCULAR | Status: DC
  Administered 2012-01-23: 40 mg via INTRAVENOUS
  Filled 2012-01-23 (×2): qty 1

## 2012-01-23 MED ORDER — LORAZEPAM 2 MG/ML IJ SOLN: 2.0000 mg | Freq: Once | INTRAMUSCULAR | Status: AC

## 2012-01-23 MED ORDER — LORAZEPAM 2 MG/ML IJ SOLN
INTRAMUSCULAR | Status: AC
  Administered 2012-01-23: 2 mg
  Filled 2012-01-23: qty 1

## 2012-01-23 MED ORDER — HALOPERIDOL LACTATE 5 MG/ML IJ SOLN
1.0000 mg | INTRAMUSCULAR | Status: DC | PRN
  Administered 2012-01-23 (×2): 4 mg via INTRAVENOUS
  Filled 2012-01-23 (×2): qty 1

## 2012-01-23 MED ORDER — FUROSEMIDE 10 MG/ML IJ SOLN
40.0000 mg | Freq: Four times a day (QID) | INTRAMUSCULAR | Status: AC
  Administered 2012-01-23 (×3): 40 mg via INTRAVENOUS
  Filled 2012-01-23 (×3): qty 4

## 2012-01-23 MED ORDER — PROPOFOL 10 MG/ML IV EMUL
INTRAVENOUS | Status: AC
  Administered 2012-01-23: 50 ug/kg/min via INTRAVENOUS
  Filled 2012-01-23: qty 100

## 2012-01-23 NOTE — Progress Notes (Signed)
Name: Emily Harrington MRN: [id number] DOB: 12-28-1950    LOS: 15  PCCM FOLLOW UP NOTE  History of Present Illness: 61 year old female presented 3/20 with strep bacteremia ,PEA arrest requiring CPR was intubated enroute to the ED. Extubated 3/23 and moved to SDU, PCCM signed off 3/27.   Cont to have difficulty with SOB and pain management and 3/30 developed worsening lethargy and resp distress and reintubated for hypercarbic resp failure ? Narcotic induced  Lines / Drains: L [place] TLC 3/20>>> 3/24 R femoral a-line 3/20>>> 3/22 ET tube 3/20>> 3/23 ETT 3/30>>>  Cultures: Blood 3/20 >> 1/2 pneumococcus Sputum 3/20>>> few E coli pan sensitive Urine 3/20>>> NEG cdiff  Neg 3/22 Sputum 3/30>>>candida albicans FOB BAL 4/1>>>  Antibiotics: Vancomycin 3/20>>> 3/23, 3/30 (clinical worsening)>> Zosyn 3/20>>> 3/23, 3/30 (clinical worsening ) >> Levofloxa 3/21 >> 3/30  Tests / Events: Cardiac arrest in the ED 3/20 3/20 ct abdo/pelvis- neg acute 3/20 Ct chest -Large area of airspace consolidation involving the right upper<BR>lobe most likely represents bacterial pneumonia. This will need<BR>interval radiographic followup to confirm resolution and to exclude<BR>an underlying neoplastic process which may have a similar<BR>appearance 3/20- EGDT 3/21 off pressors 3/30 reintubated 4/1 Brush and BAL cytology negative for malignancy.  SUBJ: Remains easily agitated on fentanyl and propofol.  Improving CXR post bronch.  Vital Signs: BP 109/34  Pulse 93  Temp(Src) 96.6 F (35.9 C) (Axillary)  Resp 0  Ht 5\' 5"  (1.651 m)  Wt 96.2 kg (212 lb 1.3 oz)  BMI 35.29 kg/m2  SpO2 99%  Intake/Output Summary (Last 24 hours) at 01/23/12 0811 Last data filed at 01/23/12 0741  Gross per 24 hour  Intake 3251.44 ml  Output   5380 ml  Net -2128.56 ml   Physical Examination: General:  Chronically ill female NAD erythematous rash on chest and torso. Neuro: awake, easily agitated on fent/propofol, MAE,  gen weakness HEENT:  Mm dry, no jvd, ETT Cardiovascular:  RRR, Nl S1/S2, -M/R/G mild tachy  Lungs: resps even non labored on vent, diminished R>L, exp wheeze  Abdomen:  Soft, NT, ND and +BS. Musculoskeletal:  -edema  Skin:  Multiple scars, erythematous area over genitals, inner thighs.  Labs and Imaging:   CBC    Component Value Date/Time   WBC 12.0* 01/23/2012 0430   RBC 2.98* 01/23/2012 0430   HGB 8.3* 01/23/2012 0430   HCT 26.2* 01/23/2012 0430   PLT 450* 01/23/2012 0430   MCV 87.9 01/23/2012 0430   MCH 27.9 01/23/2012 0430   MCHC 31.7 01/23/2012 0430   RDW 16.0* 01/23/2012 0430   LYMPHSABS 1.0 01/11/2012 0340   MONOABS 0.7 01/11/2012 0340   EOSABS 0.0 01/11/2012 0340   BASOSABS 0.0 01/11/2012 0340   BMET    Component Value Date/Time   NA 140 01/23/2012 0430   K 4.2 01/23/2012 0430   CL 102 01/23/2012 0430   CO2 30 01/23/2012 0430   GLUCOSE 97 01/23/2012 0430   BUN 22 01/23/2012 0430   CREATININE 2.46* 01/23/2012 0430   CALCIUM 9.2 01/23/2012 0430   GFRNONAA 20* 01/23/2012 0430   GFRAA 23* 01/23/2012 0430   Assessment and Plan: 61 yead old female with PMH of asthma who presents to the hospital after being found SOB and unresponsive. Patient likely has a RUL PNA vs mass. Intubated 3/23-3/25.  Developed worsening resp distress 3/30 and tx to ICU.   Acute hypoxic resp failure - in setting RUL PNA v mass, underlying asthma and significant atelectasis/collapse in setting mult  rib/sternal fx with pain and splinting + narcotics ?  Worsening R atx/collapse on CXR. PLAN -  SBT today for potential extubation. FOB done, brushing and BAL from RUL and RLL performed cytology negative and BAL cultures are negative. F/u CXR. Will need adequate pain control once extubated to avoid further mucous plugging. Diureses again today.  RUL PNA -- v mass.  Ecoli in previous sputum  Lab 01/23/12 0430 01/22/12 0530 01/21/12 0440 01/20/12 0425 01/19/12 0411  WBC 12.0* 15.6* 16.2* 21.5* 20.8*  PLAN -  Broadened abx for now to  cover HCAP with increased WBC and worsening atx. Repeat sputum culture pending from BAL from 4/1 but negative so far. Brush and BAL cytology negative for malignancy. D/C vanc, continue zosyn for now.  Agitation - Use propofol gtt & fent  Prefer to avoid versed due to severe delirium, will add seroquel today to try and decrease propofol need.  Acute renal failure -- non oliguric.  SCr trending back up. Renal u/s negative except for non obstructing bladder stone.  Cr is stable.   Lab 01/23/12 0430 01/22/12 0530 01/21/12 0440 01/20/12 0425 01/19/12 0411  CREATININE 2.46* 2.45* 2.65* 2.77* 2.56*  PLAN -  F/u chem  KVO IVF Diureses today  Rib fx/ pain  PLAN -  Fent gtt  for now Will need pain control and aggressive pulm hygiene when extubated  HTN --  PLAN -  Low dose clonidine 4/1, may also help some with agitation   Rash: ?fungal, will start topical anti-fungal.  Prophylaxis - lovenox, protonix  CC time 35 minutes.  Koren Bound, M.D. [phone]

## 2012-01-23 NOTE — Evaluation (Signed)
Clinical/Bedside Swallow Evaluation Patient Details  Name: BHUMI GODBEY MRN: [id number] DOB: 01/24/1951 Today's Date: 01/23/2012  Past Medical History:  Past Medical History  Diagnosis Date  . Asthma    Past Surgical History:  Past Surgical History  Procedure Date  . Cholecystectomy   . Abdominal hysterectomy    HPI:  61 year old female presented 3/20 with strep bacteremia ,PEA arrest requiring CPR was intubated enroute to the ED. Extubated 3/23 and moved to SDU, PCCM signed off 3/27.   Cont to have difficulty with SOB and pain management and 3/30 developed worsening lethargy and resp distress and reintubated for hypercarbic resp failure ? Narcotic induced. Pt now with worsening right upper lobe pna vs. mass.    Assessment/Recommendations/Treatment Plan    SLP Assessment Clinical Impression Statement: Pt presents with indication of decreased airway protection including dysphonia, weak cough response, poor coordination of respiration and swallow with gasping for air before and after swallow. Feel pt at risk for acute reversible dysphagia with silent aspiration. Given tenuous respiratory status, recommend pt remain NPO excpet for ice chips until am, SLP will return in am to reassess with POs for diet initiation vs. FEES.  Risk for Aspiration: Moderate Other Related Risk Factors: Decreased respiratory status  Swallow Evaluation Recommendations Recommended Consults: FEES Diet Recommendations: Ice chips PRN after oral care;NPO  Treatment Plan Treatment Plan Recommendations: Therapy as outlined in treatment plan below Speech Therapy Frequency: min 2x/week Treatment Duration: 2 weeks        Swallowing Goals  SLP Swallowing Goals Goal #3: Pt will consume PO trials at bedside with appropriate coordination of respiration and swallow to determine ability to initiate diet.    General  HPI: 61 year old female presented 3/20 with strep bacteremia ,PEA arrest requiring CPR was  intubated enroute to the ED. Extubated 3/23 and moved to SDU, PCCM signed off 3/27.   Cont to have difficulty with SOB and pain management and 3/30 developed worsening lethargy and resp distress and reintubated for hypercarbic resp failure ? Narcotic induced. Pt now with worsening right upper lobe pna vs. mass.  Type of Study: Bedside swallow evaluation Diet Prior to this Study: NPO Temperature Spikes Noted: No Respiratory Status: Supplemental O2 delivered via (comment) History of Intubation: Yes Length of Intubations (days): 13 days Date extubated: 01/23/12 Behavior/Cognition: Alert;Cooperative Oral Cavity - Dentition: Edentulous Vision: Functional for self-feeding Patient Positioning: Upright in chair Baseline Vocal Quality: Hoarse;Breathy Volitional Cough: Weak Volitional Swallow: Able to elicit  Oral Motor/Sensory Function  Overall Oral Motor/Sensory Function: Appears within functional limits for tasks assessed  Consistency Results  Ice Chips Ice chips: Impaired Presentation: Spoon Pharyngeal Phase Impairments: Decreased hyoid-laryngeal movement;Change in Vital Signs (increased RR)  Thin Liquid Thin Liquid: Impaired Presentation: Cup;Self Fed Pharyngeal  Phase Impairments: Multiple swallows;Decreased hyoid-laryngeal movement;Change in Vital Signs (increased rr, gasp for air after swallow)  Nectar Thick Liquid Nectar Thick Liquid: Not tested  Honey Thick Liquid Honey Thick Liquid: Not tested  Puree Puree: Not tested  Solid Solid: Not tested Harlon Ditty, MA CCC-SLP [phone]   Claudine Mouton 01/23/2012,4:19 PM

## 2012-01-23 NOTE — Procedures (Signed)
Extubation Procedure Note  Patient Details:   Name: Emily Harrington DOB: 1951-04-05 MRN: [id number]   Airway Documentation:  Airway 7.5 mm (Active)  Secured at (cm) 19 cm 01/23/2012  8:43 AM  Measured From Lips 01/23/2012  8:43 AM  Secured Location Center 01/23/2012  8:43 AM  Secured By [employer] 01/23/2012  8:43 AM  Tube Holder Repositioned Yes 01/23/2012  8:43 AM  Cuff Pressure (cm H2O) 22 cm H2O 01/23/2012  8:43 AM  Site Condition Dry 01/22/2012  8:17 PM    Evaluation  O2 sats: stable throughout Complications: No apparent complications Patient did tolerate procedure well. Bilateral Breath Sounds: Clear Suctioning: Airway Yes Pt extubated with negative cuff leak per Dr. Molli Knock vitals stable no stridor heard.   Ok Anis, MA 01/23/2012, 10:45 AM

## 2012-01-23 NOTE — Progress Notes (Signed)
eLink Physician-Brief Progress Note Patient Name: Emily Harrington DOB: 02/25/1951 MRN: [id number]  Date of Service  01/23/2012   HPI/Events of Note     eICU Interventions  Haldol for agitation/ confusion   Intervention Category Minor Interventions: Agitation / anxiety - evaluation and management  Kalie Cabral V. 01/23/2012, 9:21 PM

## 2012-01-23 NOTE — Progress Notes (Signed)
Clinical Social Worker reviewed chart.  Pt currently in with RN and unavailable.  CSW to continue to follow and assist as needed.   Angelia Mould, MSW, [place] [phone]

## 2012-01-23 NOTE — Progress Notes (Signed)
eLink Physician-Brief Progress Note Patient Name: Emily Harrington DOB: May 25, 1951 MRN: [id number]  Date of Service  01/23/2012   HPI/Events of Note   resp distress with wheezing - received neb  eICU Interventions  Solumedrol q 12h bipap prn      Nyema Hachey V. 01/23/2012, 5:46 PM

## 2012-01-23 NOTE — Progress Notes (Signed)
Checked cuff leak prior to extubation with negative cuff leak, MD paged PS increased to 10. Will wait for MD to okay extubation with neg cuff leak.

## 2012-01-23 NOTE — Procedures (Signed)
Name:  AKSHAYA TOEPFER MRN:  [id number] DOB:  1951-10-12  PROCEDURE NOTE  Procedure(s): Flexible bronchoscopy [phone])  Indications:  Airway obstruction  Consent:  Consent was implied due to the emergency nature of the procedure.  Anesthesia:  Patient was receiving ICU sedation with Propofol   Procedure summary:  Appropriate equipment was assembled.  The patient was identified as Emily Harrington.  Safety timeout was performed. The patient was placed supine.  After the appropriate level of anesthesia was assured, flexible video was lubricated and inserted through the endotracheal tube.   Airway examination was performed bilaterally to subsegmental level and revealed moderate amount of mucoid secretions, friable inflamed mucosa and no endobronchial lesions.  The endotracheal tube was then withdrawn over the bronchoscope and the tip of the tube was positioned at the cricoid level.  Trachea was examined carefully.  No overt stenosis was identified. 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  The tube was then advanced over the bronchoscope and the tip was position 3 cm above the carina.  The tube was secured and the bronchoscope was withdrawn.  Specimens sent:   None.  Complications:  No immediate complications were noted.  Hemodynamic parameters and oxygenation remained stable throughout the procedure.  Estimated blood loss:  Less then 5 mL.  Orlean Bradford, M.D., F.C.C.P. Pulmonary and Critical Care Medicine [facility] Cell: [phone]  01/23/2012, 11:39 PM

## 2012-01-23 NOTE — Progress Notes (Signed)
Utilization Review Completed.Emily Harrington T4/01/2012   

## 2012-01-23 NOTE — Progress Notes (Signed)
PT Cancellation Note  Treatment cancelled today due to medical issues with patient which prohibited therapy. Noted plan to extubate today. Will hold for now.   [facility] HELEN 01/23/2012, 10:35 AM Pager: [phone]

## 2012-01-23 NOTE — Procedures (Signed)
Name: [name] MRN: [id number] DOB: 1951-07-16   PROCEDURE NOTE  Procedure:  Endotracheal intubation.  Indication:  Acute respiratory failure  Consent:  Consent was implied due to the emergency nature of the procedure.  Anesthesia:  A total of 10 mg of Etomidate was given intravenously.  Procedure summary:  Appropriate equipment was assembled. The patient was identified as Emily Harrington and safety timeout was performed. The patient was placed supine, with head in sniffing position. After adequate level of anesthesia was achieved, a GlideScope #3 blade was inserted into the oropharynx and the vocal cords were visualized. A 7.0 endotracheal tube was inserted without difficulty and visualized going through the vocal cords. Vocal cords appeared normal without paradoxical motion. The stylette was removed and cuff inflated. Colorimetric change was noted on the CO2 meter. Breath sounds were heard over both lung fields equally. Post procedure chest xray was ordered.  Complications:  No immediate complications were noted.  Hemodynamic parameters and oxygenation remained stable throughout the procedure.    Orlean Bradford, M.D., F.C.C.P. Pulmonary and Critical Care Medicine [facility] Cell: [phone] Pager: [phone]  01/23/2012, 11:32 PM

## 2012-01-24 DIAGNOSIS — M87059 Idiopathic aseptic necrosis of unspecified femur: Secondary | ICD-10-CM

## 2012-01-24 LAB — CBC
HCT: 25.6 % — ABNORMAL LOW (ref 36.0–46.0)
Hemoglobin: 8.4 g/dL — ABNORMAL LOW (ref 12.0–15.0)
MCV: 85.9 fL (ref 78.0–100.0)
RBC: 2.98 MIL/uL — ABNORMAL LOW (ref 3.87–5.11)
RDW: 15.2 % (ref 11.5–15.5)
WBC: 11.3 10*3/uL — ABNORMAL HIGH (ref 4.0–10.5)

## 2012-01-24 LAB — GLUCOSE, CAPILLARY
Glucose-Capillary: 103 mg/dL — ABNORMAL HIGH (ref 70–99)
Glucose-Capillary: 128 mg/dL — ABNORMAL HIGH (ref 70–99)
Glucose-Capillary: 129 mg/dL — ABNORMAL HIGH (ref 70–99)
Glucose-Capillary: 87 mg/dL (ref 70–99)
Glucose-Capillary: 91 mg/dL (ref 70–99)

## 2012-01-24 LAB — BASIC METABOLIC PANEL
BUN: 25 mg/dL — ABNORMAL HIGH (ref 6–23)
CO2: 30 mEq/L (ref 19–32)
Chloride: 95 mEq/L — ABNORMAL LOW (ref 96–112)
Creatinine, Ser: 2.18 mg/dL — ABNORMAL HIGH (ref 0.50–1.10)
Glucose, Bld: 112 mg/dL — ABNORMAL HIGH (ref 70–99)

## 2012-01-24 LAB — POCT I-STAT 3, ART BLOOD GAS (G3+)
Bicarbonate: 34.7 mEq/L — ABNORMAL HIGH (ref 20.0–24.0)
pCO2 arterial: 53.3 mmHg — ABNORMAL HIGH (ref 35.0–45.0)
pH, Arterial: 7.422 — ABNORMAL HIGH (ref 7.350–7.400)
pO2, Arterial: 287 mmHg — ABNORMAL HIGH (ref 80.0–100.0)

## 2012-01-24 MED ORDER — SODIUM CHLORIDE 0.9 % IV SOLN
1.0000 mg/h | INTRAVENOUS | Status: DC
  Administered 2012-01-24: 3 mg/h via INTRAVENOUS
  Administered 2012-01-24 – 2012-01-25 (×2): 8 mg/h via INTRAVENOUS
  Administered 2012-01-25 (×3): 10 mg/h via INTRAVENOUS
  Administered 2012-01-25: 8 mg/h via INTRAVENOUS
  Administered 2012-01-26: 10 mg/h via INTRAVENOUS
  Administered 2012-01-26: 4 mg/h via INTRAVENOUS
  Administered 2012-01-26: 10 mg/h via INTRAVENOUS
  Administered 2012-01-27: 5 mg/h via INTRAVENOUS
  Administered 2012-01-27: 10 mg/h via INTRAVENOUS
  Administered 2012-01-28: 4 mg/h via INTRAVENOUS
  Administered 2012-01-29: 3 mg/h via INTRAVENOUS
  Administered 2012-01-30: 6 mg/h via INTRAVENOUS
  Filled 2012-01-24 (×21): qty 10

## 2012-01-24 NOTE — Progress Notes (Signed)
ANTIBIOTIC CONSULT NOTE - FOLLOW UP  Pharmacy Consult for Vancomycin/Zosyn Indication: Empiric HAP coverage  Allergies  Allergen Reactions  . Nsaids     Patient Measurements: Height: 5\' 5"  (165.1 cm) Weight: 212 lb 1.3 oz (96.2 kg) (one pillow on sheet one pad bed scale) IBW/kg (Calculated) : 57   Vital Signs: Temp: 97.4 F (36.3 C) (04/05 0700) Temp src: Oral (04/05 0700) BP: 98/48 mmHg (04/05 0837) Pulse Rate: 105  (04/05 0837) Intake/Output from previous day: 04/04 0701 - 04/05 0700 In: 2245.9 [I.V.:1717.9; NG/GT:165; IV Piggyback:133] Out: 5910 [Urine:5450; Emesis/NG output:100; Stool:360] Intake/Output from this shift: Total I/O In: 240.6 [I.V.:185.6; NG/GT:30; IV Piggyback:25] Out: 35 [Urine:35]  Labs:  [facility] 01/24/12 0413 01/23/12 0430 01/22/12 0530  WBC 11.3* 12.0* 15.6*  HGB 8.4* 8.3* 8.5*  PLT 432* 450* 459*  LABCREA -- -- --  CREATININE 2.18* 2.46* 2.45*   Estimated Creatinine Clearance: 30.7 ml/min (by C-G formula based on Cr of 2.18).  Basename 01/22/12 1200  VANCOTROUGH 25.6*  VANCOPEAK --  Drue Dun --  GENTTROUGH --  GENTPEAK --  GENTRANDOM --  TOBRATROUGH --  TOBRAPEAK --  TOBRARND --  AMIKACINPEAK --  AMIKACINTROU --  AMIKACIN --     Microbiology: Recent Results (from the past 720 hour(s))  CULTURE, RESPIRATORY     Status: Normal   Collection Time   01/08/12  4:27 PM      Component Value Range Status Comment   Specimen Description ENDOTRACHEAL   Final    Special Requests NONE   Final    Gram Stain     Final    Value: FEW WBC PRESENT,BOTH PMN AND MONONUCLEAR     RARE SQUAMOUS EPITHELIAL CELLS PRESENT     FEW GRAM POSITIVE COCCI IN PAIRS     RARE GRAM POSITIVE RODS     RARE GRAM NEGATIVE RODS   Culture FEW ESCHERICHIA COLI   Final    Report Status 01/11/2012 FINAL   Final    Organism ID, Bacteria ESCHERICHIA COLI   Final   MRSA PCR SCREENING     Status: Normal   Collection Time   01/08/12  5:48 PM      Component Value  Range Status Comment   MRSA by PCR NEGATIVE  NEGATIVE  Final   CULTURE, BLOOD (ROUTINE X 2)     Status: Normal   Collection Time   01/08/12  5:50 PM      Component Value Range Status Comment   Specimen Description BLOOD ARM RIGHT   Final    Special Requests BOTTLES DRAWN AEROBIC AND ANAEROBIC 10CC   Final    Culture  Setup Time [id number]   Final    Culture     Final    Value: STREPTOCOCCUS PNEUMONIAE     Note: Gram Stain Report Called to,Read Back By and Verified With: JULIANNA CLAIR @ 1450 01/09/12 WICKN   Report Status 01/11/2012 FINAL   Final    Organism ID, Bacteria STREPTOCOCCUS PNEUMONIAE   Final   CULTURE, BLOOD (ROUTINE X 2)     Status: Normal   Collection Time   01/08/12  6:00 PM      Component Value Range Status Comment   Specimen Description BLOOD ARM LEFT   Final    Special Requests BOTTLES DRAWN AEROBIC ONLY 10CC   Final    Culture  Setup Time [id number]   Final    Culture NO GROWTH 5 DAYS   Final    Report Status 01/15/2012 FINAL  Final   URINE CULTURE     Status: Normal   Collection Time   01/08/12  8:46 PM      Component Value Range Status Comment   Specimen Description URINE, CATHETERIZED   Final    Special Requests NONE   Final    Culture  Setup Time [id number]   Final    Colony Count NO GROWTH   Final    Culture NO GROWTH   Final    Report Status 01/09/2012 FINAL   Final   CLOSTRIDIUM DIFFICILE BY PCR     Status: Normal   Collection Time   01/10/12  3:51 AM      Component Value Range Status Comment   C difficile by pcr NEGATIVE  NEGATIVE  Final   CULTURE, RESPIRATORY     Status: Normal   Collection Time   01/18/12  1:17 PM      Component Value Range Status Comment   Specimen Description TRACHEAL ASPIRATE   Final    Special Requests NONE   Final    Gram Stain     Final    Value: FEW WBC PRESENT, PREDOMINANTLY PMN     FEW SQUAMOUS EPITHELIAL CELLS PRESENT     RARE GRAM POSITIVE COCCI IN PAIRS     RARE GRAM VARIABLE ROD   Culture FEW CANDIDA ALBICANS    Final    Report Status 01/20/2012 FINAL   Final   MRSA PCR SCREENING     Status: Normal   Collection Time   01/18/12  1:22 PM      Component Value Range Status Comment   MRSA by PCR NEGATIVE  NEGATIVE  Final   AFB CULTURE WITH SMEAR     Status: Normal (Preliminary result)   Collection Time   01/20/12  1:59 PM      Component Value Range Status Comment   Specimen Description BRONCHIAL WASHINGS   Final    Special Requests RLL   Final    ACID FAST SMEAR NO ACID FAST BACILLI SEEN   Final    Culture     Final    Value: CULTURE WILL BE EXAMINED FOR 6 WEEKS BEFORE ISSUING A FINAL REPORT   Report Status PENDING   Incomplete   FUNGUS CULTURE W SMEAR     Status: Normal (Preliminary result)   Collection Time   01/20/12  1:59 PM      Component Value Range Status Comment   Specimen Description BRONCHIAL WASHINGS   Final    Special Requests RLL   Final    Fungal Smear NO YEAST OR FUNGAL ELEMENTS SEEN   Final    Culture CULTURE IN PROGRESS FOR FOUR WEEKS   Final    Report Status PENDING   Incomplete   LEGIONELLA CULTURE     Status: Normal (Preliminary result)   Collection Time   01/20/12  1:59 PM      Component Value Range Status Comment   Specimen Description BRONCHIAL WASHINGS   Final    Special Requests RLL   Final    Culture     Final    Value: NO LEGIONELLA ISOLATED, CULTURE IN PROGRESS FOR 5 DAYS   Report Status PENDING   Incomplete   CULTURE, BAL-QUANTITATIVE     Status: Normal   Collection Time   01/20/12  2:02 PM      Component Value Range Status Comment   Specimen Description BRONCHIAL WASHINGS   Final    Special Requests RLL  Final    Gram Stain     Final    Value: FEW WBC PRESENT,BOTH PMN AND MONONUCLEAR     NO SQUAMOUS EPITHELIAL CELLS SEEN     NO ORGANISMS SEEN   Colony Count 200 COLONIES/ML   Final    Culture CANDIDA ALBICANS   Final    Report Status 01/23/2012 FINAL   Final   CULTURE, RESPIRATORY     Status: Normal   Collection Time   01/20/12  2:02 PM      Component Value  Range Status Comment   Specimen Description BRONCHIAL WASHINGS   Final    Special Requests RLL   Final    Gram Stain     Final    Value: FEW WBC PRESENT,BOTH PMN AND MONONUCLEAR     NO SQUAMOUS EPITHELIAL CELLS SEEN     NO ORGANISMS SEEN   Culture NO GROWTH 2 DAYS   Final    Report Status 01/22/2012 FINAL   Final   CLOSTRIDIUM DIFFICILE BY PCR     Status: Normal   Collection Time   01/21/12  8:46 PM      Component Value Range Status Comment   C difficile by pcr NEGATIVE  NEGATIVE  Final     Anti-infectives     Start     Dose/Rate Route Frequency Ordered Stop   01/22/12 1800   vancomycin (VANCOCIN) 750 mg in sodium chloride 0.9 % 150 mL IVPB  Status:  Discontinued        750 mg 150 mL/hr over 60 Minutes Intravenous Every 24 hours 01/22/12 1351 01/23/12 0909   01/18/12 1300   vancomycin (VANCOCIN) IVPB 1000 mg/200 mL premix  Status:  Discontinued        1,000 mg 200 mL/hr over 60 Minutes Intravenous Every 24 hours 01/18/12 1215 01/22/12 1350   01/18/12 1300   piperacillin-tazobactam (ZOSYN) IVPB 3.375 g        3.375 g 12.5 mL/hr over 240 Minutes Intravenous Every 8 hours 01/18/12 1215     01/13/12 1000   levofloxacin (LEVAQUIN) tablet 250 mg  Status:  Discontinued        250 mg Oral Daily 01/12/12 1150 01/18/12 1145   01/12/12 1200   levofloxacin (LEVAQUIN) tablet 500 mg        500 mg Oral  Once 01/12/12 1135 01/12/12 1250   01/12/12 1200   levofloxacin (LEVAQUIN) tablet 250 mg  Status:  Discontinued        250 mg Oral Daily 01/12/12 1149 01/12/12 1150   01/09/12 1100   Levofloxacin (LEVAQUIN) IVPB 750 mg  Status:  Discontinued        750 mg 100 mL/hr over 90 Minutes Intravenous Every 48 hours 01/09/12 1028 01/12/12 1133   01/09/12 1000   vancomycin (VANCOCIN) IVPB 1000 mg/200 mL premix  Status:  Discontinued        1,000 mg 200 mL/hr over 60 Minutes Intravenous Every 24 hours 01/09/12 1403 01/11/12 0956   01/08/12 2300   piperacillin-tazobactam (ZOSYN) IVPB 3.375 g   Status:  Discontinued        3.375 g 12.5 mL/hr over 240 Minutes Intravenous Every 8 hours 01/08/12 1659 01/11/12 0956   01/08/12 1800   vancomycin (VANCOCIN) 1,500 mg in sodium chloride 0.9 % 500 mL IVPB  Status:  Discontinued        1,500 mg 250 mL/hr over 120 Minutes Intravenous Every 12 hours 01/08/12 1659 01/09/12 1403   01/08/12 1700   piperacillin-tazobactam (  ZOSYN) IVPB 3.375 g        3.375 g 100 mL/hr over 30 Minutes Intravenous  Once 01/08/12 1659 01/08/12 2043          Assessment: 61 y.o. F on Zosyn D#7 for empiric HAP coverage in the setting of worsening clinical status (Vancomycin d/ced on 4/4).  Repeat respiratory cultures show no growth and repeat BAL cultures show few candida albicans. Renal function continues to improve. SCr: 2.18, CrCl~30 ml/min. The patient is afebrile and WBC continue to trend down. Dose remains appropriate.   Goal of Therapy:  Vancomycin trough level 15-20 mcg/ml  Plan:  1.  Continue Zosyn 3.375 gm IV q8hr (each dose over 4 hours) 2. Will continue to follow renal function, culture results, LOT, and antibiotic de-escalation plans   Georgina Pillion, PharmD, BCPS Clinical Pharmacist Pager: [phone] 01/24/2012 10:12 AM

## 2012-01-24 NOTE — Progress Notes (Signed)
Name: Emily Harrington MRN: [id number] DOB: Dec 11, 1950    LOS: 16  PCCM FOLLOW UP NOTE  History of Present Illness: 61 year old female presented 3/20 with strep bacteremia ,PEA arrest requiring CPR was intubated enroute to the ED. Extubated 3/23 and moved to SDU, PCCM signed off 3/27.   Cont to have difficulty with SOB and pain management and 3/30 developed worsening lethargy and resp distress and reintubated for hypercarbic resp failure ? Narcotic induced  Lines / Drains: L [place] TLC 3/20>>> 3/24 R femoral a-line 3/20>>> 3/22 ET tube 3/20>> 3/23 ETT 3/30>>>  Cultures: Blood 3/20 >> 1/2 pneumococcus Sputum 3/20>>> few E coli pan sensitive Urine 3/20>>> NEG cdiff  Neg 3/22 Sputum 3/30>>>candida albicans FOB BAL 4/1>>>  Antibiotics: Vancomycin 3/20>>> 3/23, 3/30 (clinical worsening)>> Zosyn 3/20>>> 3/23, 3/30 (clinical worsening ) >> Levofloxa 3/21 >> 3/30  Tests / Events: Cardiac arrest in the ED 3/20 3/20 ct abdo/pelvis- neg acute 3/20 Ct chest -Large area of airspace consolidation involving the right upper<BR>lobe most likely represents bacterial pneumonia. This will need<BR>interval radiographic followup to confirm resolution and to exclude<BR>an underlying neoplastic process which may have a similar<BR>appearance 3/20- EGDT 3/21 off pressors 3/30 reintubated 4/1 Brush and BAL cytology negative for malignancy.  SUBJ: Reintubated overnight with severe tracheomalacia noted.  Vital Signs: BP 98/48  Pulse 105  Temp(Src) 97.4 F (36.3 C) (Oral)  Resp 16  Ht 5\' 5"  (1.651 m)  Wt 96.2 kg (212 lb 1.3 oz)  BMI 35.29 kg/m2  SpO2 100%  Intake/Output Summary (Last 24 hours) at 01/24/12 0847 Last data filed at 01/24/12 0800  Gross per 24 hour  Intake 2291.17 ml  Output   5510 ml  Net -3218.83 ml   Physical Examination: General:  Chronically ill female NAD erythematous rash on chest and torso. Neuro: awake, easily agitated on fent/propofol, MAE, gen weakness HEENT:  Mm  dry, no jvd, ETT Cardiovascular:  RRR, Nl S1/S2, -M/R/G mild tachy  Lungs: resps even non labored on vent, diminished R>L, exp wheeze  Abdomen:  Soft, NT, ND and +BS. Musculoskeletal:  -edema  Skin:  Multiple scars, erythematous area over genitals, inner thighs.  Labs and Imaging:   CBC    Component Value Date/Time   WBC 11.3* 01/24/2012 0413   RBC 2.98* 01/24/2012 0413   HGB 8.4* 01/24/2012 0413   HCT 25.6* 01/24/2012 0413   PLT 432* 01/24/2012 0413   MCV 85.9 01/24/2012 0413   MCH 28.2 01/24/2012 0413   MCHC 32.8 01/24/2012 0413   RDW 15.2 01/24/2012 0413   LYMPHSABS 1.0 01/11/2012 0340   MONOABS 0.7 01/11/2012 0340   EOSABS 0.0 01/11/2012 0340   BASOSABS 0.0 01/11/2012 0340   BMET    Component Value Date/Time   NA 138 01/24/2012 0413   K 3.7 01/24/2012 0413   CL 95* 01/24/2012 0413   CO2 30 01/24/2012 0413   GLUCOSE 112* 01/24/2012 0413   BUN 25* 01/24/2012 0413   CREATININE 2.18* 01/24/2012 0413   CALCIUM 8.4 01/24/2012 0413   GFRNONAA 23* 01/24/2012 0413   GFRAA 27* 01/24/2012 0413   Assessment and Plan: 66 yead old female with PMH of asthma who presents to the hospital after being found SOB and unresponsive. Patient likely has a RUL PNA vs mass. Intubated 3/23-3/25.  Developed worsening resp distress 3/30 and tx to ICU.   Acute hypoxic resp failure - in setting RUL PNA v mass, underlying asthma and significant atelectasis/collapse in setting mult rib/sternal fx with pain and splinting +  narcotics ?  Worsening R atx/collapse on CXR.  Reintubated overnight with severe tracheomalacia noted. PLAN -  Full vent support for today since patient just got extubated. FOB done, brushing and BAL from RUL and RLL performed cytology negative and BAL cultures are negative.  Severe tracheomalacia noted.  Will need trach.  I spoke with the daughter at length, informed her of findings.  She is very unhappy and unaccepting of the facts and wishes to think about whether or not her mother would want a tracheostomy.  All  questions were answered and daughter was asked to let us know whenever her and her brother make a decision regarding trach. F/u CXR. Will need adequate pain control once extubated to avoid further mucous plugging. Keep fluid status even at this point. Change from propofol to versed since I doubt this will be a short period of need for sedation.  RUL PNA -- v mass.  Ecoli in previous sputum  Lab 01/24/12 0413 01/23/12 0430 01/22/12 0530 01/21/12 0440 01/20/12 0425  WBC 11.3* 12.0* 15.6* 16.2* 21.5*  PLAN -  - Broadened abx for now to cover HCAP with increased WBC and worsening atx. - Repeat sputum culture negative from BAL from 4/1 but negative so far. - Brush and BAL cytology negative for malignancy. - D/C vanc, continue zosyn for now.  Agitation - Use propofol gtt & fent. Prefer to avoid versed due to severe delirium but given the likely duration for this sedation will change to versed at low dose.  Acute renal failure -- non oliguric.  SCr trending downward. Renal u/s negative except for non obstructing bladder stone.  Cr is stable.   Lab 01/24/12 0413 01/23/12 0430 01/22/12 0530 01/21/12 0440 01/20/12 0425  CREATININE 2.18* 2.46* 2.45* 2.65* 2.77*  PLAN -  F/u chem. KVO IVF. No diureses today.  Rib fx/ pain  PLAN -  Fent gtt  for now Will need pain control and aggressive pulm hygiene when extubated  HTN --  PLAN -  Low dose clonidine 4/1, may also help some with agitation   Rash: ?fungal, will start topical anti-fungal.  Prophylaxis - lovenox, protonix  Daughter is becoming increasing irate, she was unhappy before with her mother loosing her dentures and now that she is faced with facts that her mother will not get of the ventilator she is becoming more unhappy and complaining of every aspect of her mother's care.  I informed the unit director to be aware of the situation.  I was informed that service response as well as risk management have already been involved due to  previous complains from the daughter.  Will await daughter's decision on trach vs extubation with no intention of reintubation.  CC time 45 minutes.  Koren Bound, M.D. [phone]

## 2012-01-24 NOTE — Progress Notes (Signed)
SLP Cancellation Note  Treatment cancelled today due to patient now intubated secondary to tracheomalacia. SLP signing off . Please re-consult as needed.   Emily Harrington Emily Harrington 01/24/2012, 10:07 AM

## 2012-01-24 NOTE — Progress Notes (Signed)
Nutrition Follow-up  Pt extubated 4/4 AM, re-intubated per MD in PM. S/p bedside swallow evaluation 4/4. EN discontinued with extubation, resumed at 15 ml/hr via OGT. Per RN, Propofol infusion being discontinued.  Diet Order:  NPO  Meds: Scheduled Meds:   . albuterol-ipratropium  6 puff Inhalation Q6H  . antiseptic oral rinse  15 mL Mouth Rinse QID  . aspirin EC  162 mg Oral Daily  . chlorhexidine  15 mL Mouth Rinse BID  . cloNIDine  0.1 mg Per Tube BID  . enoxaparin (LOVENOX) injection  30 mg Subcutaneous Q24H  . etomidate  20 mg Intravenous Once  . feeding supplement  30 mL Per Tube QID  . feeding supplement (PROMOTE)  1,000 mL Per Tube Q24H  . furosemide  40 mg Intravenous Q6H  . Gerhardt's butt cream   Topical BID  . haloperidol lactate  5 mg Intravenous Once  . insulin aspart  0-15 Units Subcutaneous Q4H  . LORazepam      . LORazepam  2 mg Intravenous Once  . nystatin   Topical TID  . pantoprazole sodium  40 mg Per Tube Q1200  . piperacillin-tazobactam (ZOSYN)  IV  3.375 g Intravenous Q8H  . DISCONTD: fentaNYL  100 mcg Intravenous Once  . DISCONTD: methylPREDNISolone (SOLU-MEDROL) injection  40 mg Intravenous Q12H  . DISCONTD: midazolam  2 mg Intravenous Once   Continuous Infusions:   . sodium chloride 20 mL/hr at 01/23/12 1200  . dextrose 5 % and 0.45% NaCl 50 mL/hr at 01/23/12 2356  . propofol 20 mcg/kg/min (01/24/12 0757)  . DISCONTD: fentaNYL infusion INTRAVENOUS Stopped (01/23/12 1010)  . DISCONTD: midazolam (VERSED) infusion 2 mg/hr (01/19/12 0414)  . DISCONTD: propofol Stopped (01/23/12 0750)   PRN Meds:.acetaminophen (TYLENOL) oral liquid 160 mg/5 mL, albuterol, fentaNYL, DISCONTD: fentaNYL, DISCONTD: haloperidol lactate, DISCONTD: midazolam, DISCONTD:  morphine injection  Labs:  CMP     Component Value Date/Time   NA 138 01/24/2012 0413   K 3.7 01/24/2012 0413   CL 95* 01/24/2012 0413   CO2 30 01/24/2012 0413   GLUCOSE 112* 01/24/2012 0413   BUN 25* 01/24/2012 0413     CREATININE 2.18* 01/24/2012 0413   CALCIUM 8.4 01/24/2012 0413   PROT 5.9* 01/17/2012 0425   ALBUMIN 2.1* 01/17/2012 0425   AST 30 01/17/2012 0425   ALT 42* 01/17/2012 0425   ALKPHOS 158* 01/17/2012 0425   BILITOT 0.2* 01/17/2012 0425   GFRNONAA 23* 01/24/2012 0413   GFRAA 27* 01/24/2012 0413     Intake/Output Summary (Last 24 hours) at 01/24/12 0920 Last data filed at 01/24/12 0800  Gross per 24 hour  Intake 2176.17 ml  Output   5310 ml  Net -3133.83 ml    CBG (last 3)   Basename 01/24/12 0719 01/24/12 0306 01/24/12 0017  GLUCAP 103* 128* 129*    Weight Status:  96.2 kg (4/4) -- stable  Re-estimated needs:  1600 kcals, 105-115 gm protein  Nutrition Dx:  Inadequate Oral Intake r/t inability to eat as evidenced by NPO status, ongoing.  Goal:  EN to provide 60-70% of estimated calorie needs (22-25 kcals/kg ideal body weight) and 100% of estimated protein needs, based on ASPEN guidelines for permissive underfeeding in critically ill obese individuals, currently unmet Monitor: EN tolerance, respiratory status, weight, labs, I/O's  Intervention/Plan:    Continue to increase Promote formula every 4 hours to goal rate of 35 ml/hr with Prostat liquid protein 30 ml QID via tube to provide 1128 total kcals (70% of  estimated kcal needs), 113 gm protein (100% of estimated protein needs), 705 ml of free water  RD to follow for nutrition care plan  Alger Memos Pager #:  [phone]

## 2012-01-24 NOTE — Progress Notes (Signed)
Clinical Child psychotherapist received a phone call from pt's dtr, Marcelino Duster.  Marcelino Duster requested for another pulmonologist to look at pt and to follow up with dtr.  Dtr expressed interest in a "second opinion".  CSW explained that another Pulmonologist will be available on Monday.  Dtr thanked CSW for information.  CSW to continue to follow and assist as needed.  Angelia Mould, MSW, [place] [phone]

## 2012-01-24 NOTE — Progress Notes (Signed)
Clinical Social Worker spoke with pt's dtr Marcelino Duster ph #[phone]).  CSW provided emotional support to dtr and opportunity to process difficult feelings.  Dtr shared difficulty regarding conversation with MD this morning.  Dtr stated she is reviewing options and expressed sadness with current situation.  CSW validated difficulty of situation.  CSW available to assist as needed.  CSW staffed case with RNCM.    Angelia Mould, MSW, [place] [phone]

## 2012-01-25 ENCOUNTER — Inpatient Hospital Stay (HOSPITAL_COMMUNITY): Payer: Medicaid Other

## 2012-01-25 LAB — BASIC METABOLIC PANEL
Chloride: 98 mEq/L (ref 96–112)
Creatinine, Ser: 2.04 mg/dL — ABNORMAL HIGH (ref 0.50–1.10)
GFR calc Af Amer: 29 mL/min — ABNORMAL LOW (ref >90)
GFR calc non Af Amer: 25 mL/min — ABNORMAL LOW (ref >90)
Potassium: 3 mEq/L — ABNORMAL LOW (ref 3.5–5.1)

## 2012-01-25 LAB — BLOOD GAS, ARTERIAL
Bicarbonate: 31.3 mEq/L — ABNORMAL HIGH (ref 20.0–24.0)
Drawn by: 347621
FIO2: 0.3 %
O2 Saturation: 98 %
PEEP: 5 cmH2O
RATE: 14 resp/min
pCO2 arterial: 53.4 mmHg — ABNORMAL HIGH (ref 35.0–45.0)
pO2, Arterial: 107 mmHg — ABNORMAL HIGH (ref 80.0–100.0)

## 2012-01-25 LAB — CBC
MCHC: 32.7 g/dL (ref 30.0–36.0)
Platelets: 401 10*3/uL — ABNORMAL HIGH (ref 150–400)
RDW: 15.4 % (ref 11.5–15.5)
WBC: 9.2 10*3/uL (ref 4.0–10.5)

## 2012-01-25 LAB — GLUCOSE, CAPILLARY
Glucose-Capillary: 74 mg/dL (ref 70–99)
Glucose-Capillary: 81 mg/dL (ref 70–99)

## 2012-01-25 LAB — MAGNESIUM: Magnesium: 1.5 mg/dL (ref 1.5–2.5)

## 2012-01-25 MED ORDER — DEXMEDETOMIDINE HCL 100 MCG/ML IV SOLN
0.2000 ug/kg/h | INTRAVENOUS | Status: DC
  Administered 2012-01-25: 0.2 ug/kg/h via INTRAVENOUS
  Administered 2012-01-25: 0.7 ug/kg/h via INTRAVENOUS
  Administered 2012-01-26 (×2): 1 ug/kg/h via INTRAVENOUS
  Administered 2012-01-26: 0.8 ug/kg/h via INTRAVENOUS
  Administered 2012-01-26: 1 ug/kg/h via INTRAVENOUS
  Filled 2012-01-25 (×9): qty 2

## 2012-01-25 MED ORDER — QUETIAPINE FUMARATE ER 50 MG PO TB24
50.0000 mg | ORAL_TABLET | Freq: Every day | ORAL | Status: DC
  Administered 2012-01-25 – 2012-01-27 (×3): 50 mg via ORAL
  Filled 2012-01-25 (×3): qty 1

## 2012-01-25 MED ORDER — POTASSIUM CHLORIDE 20 MEQ/15ML (10%) PO LIQD
40.0000 meq | Freq: Three times a day (TID) | ORAL | Status: AC
  Administered 2012-01-25 (×3): 40 meq
  Filled 2012-01-25 (×3): qty 30

## 2012-01-25 MED ORDER — FUROSEMIDE 10 MG/ML IJ SOLN
20.0000 mg | Freq: Three times a day (TID) | INTRAMUSCULAR | Status: AC
  Administered 2012-01-25 (×2): 20 mg via INTRAVENOUS
  Filled 2012-01-25 (×2): qty 2

## 2012-01-25 NOTE — Progress Notes (Signed)
Restraint order current per Dr. Molli Knock. Restraints were ordered this am per MD. Restraints bilateral soft wrist restraints and 4 side rails up on bed were in place due to patient attempting to pull out ET tube, PICC, and foley.

## 2012-01-25 NOTE — Progress Notes (Signed)
Name: Emily Harrington MRN: [id number] DOB: 05-Mar-1951    LOS: 17  PCCM FOLLOW UP NOTE  History of Present Illness: 61 year old female presented 3/20 with strep bacteremia ,PEA arrest requiring CPR was intubated enroute to the ED. Extubated 3/23 and moved to SDU, PCCM signed off 3/27.   Cont to have difficulty with SOB and pain management and 3/30 developed worsening lethargy and resp distress and reintubated for hypercarbic resp failure ? Narcotic induced  Lines / Drains: L [place] TLC 3/20>>> 3/24 R femoral a-line 3/20>>> 3/22 ET tube 3/20>> 3/23 ETT 3/30>>>  Cultures: Blood 3/20 >> 1/2 pneumococcus Sputum 3/20>>> few E coli pan sensitive Urine 3/20>>> NEG cdiff  Neg 3/22 Sputum 3/30>>>candida albicans FOB BAL 4/1>>>  Antibiotics: Vancomycin 3/20>>> 3/23, 3/30 (clinical worsening)>>4/5 Zosyn 3/20>>> 3/23, 3/30 (clinical worsening ) >> Levofloxa 3/21 >> 3/30  Tests / Events: Cardiac arrest in the ED 3/20 3/20 ct abdo/pelvis- neg acute 3/20 Ct chest -Large area of airspace consolidation involving the right upper<BR>lobe most likely represents bacterial pneumonia. This will need<BR>interval radiographic followup to confirm resolution and to exclude<BR>an underlying neoplastic process which may have a similar<BR>appearance 3/20- EGDT 3/21 off pressors 3/30 reintubated 4/1 Brush and BAL cytology negative for malignancy.  SUBJ: Reintubated overnight with severe tracheomalacia noted.  Vital Signs: BP 156/73  Pulse 98  Temp(Src) 98.5 F (36.9 C) (Oral)  Resp 19  Ht 5\' 5"  (1.651 m)  Wt 90.4 kg (199 lb 4.7 oz)  BMI 33.16 kg/m2  SpO2 100%  Intake/Output Summary (Last 24 hours) at 01/25/12 0831 Last data filed at 01/25/12 0800  Gross per 24 hour  Intake 2703.5 ml  Output   1885 ml  Net  818.5 ml   Physical Examination: General:  Chronically ill female NAD erythematous rash on chest and torso. Neuro: awake, easily agitated on fent/propofol, MAE, gen weakness HEENT:  Mm  dry, no jvd, ETT Cardiovascular:  RRR, Nl S1/S2, -M/R/G mild tachy  Lungs: resps even non labored on vent, diminished R>L, exp wheeze  Abdomen:  Soft, NT, ND and +BS. Musculoskeletal:  -edema  Skin:  Multiple scars, erythematous area over genitals, inner thighs.  Labs and Imaging:   CBC    Component Value Date/Time   WBC 9.2 01/25/2012 0353   RBC 2.90* 01/25/2012 0353   HGB 8.1* 01/25/2012 0353   HCT 24.8* 01/25/2012 0353   PLT 401* 01/25/2012 0353   MCV 85.5 01/25/2012 0353   MCH 27.9 01/25/2012 0353   MCHC 32.7 01/25/2012 0353   RDW 15.4 01/25/2012 0353   LYMPHSABS 1.0 01/11/2012 0340   MONOABS 0.7 01/11/2012 0340   EOSABS 0.0 01/11/2012 0340   BASOSABS 0.0 01/11/2012 0340   BMET    Component Value Date/Time   NA 142 01/25/2012 0353   K 3.0* 01/25/2012 0353   CL 98 01/25/2012 0353   CO2 32 01/25/2012 0353   GLUCOSE 81 01/25/2012 0353   BUN 34* 01/25/2012 0353   CREATININE 2.04* 01/25/2012 0353   CALCIUM 8.0* 01/25/2012 0353   GFRNONAA 25* 01/25/2012 0353   GFRAA 29* 01/25/2012 0353   Assessment and Plan: 61 yead old female with PMH of asthma who presents to the hospital after being found SOB and unresponsive. Patient likely has a RUL PNA vs mass. Intubated 3/23-3/25.  Developed worsening resp distress 3/30 and tx to ICU.  Severe tracheomalacia, intubated  Acute hypoxic resp failure - in setting RUL PNA v mass, underlying asthma and significant atelectasis/collapse in setting mult rib/sternal fx  with pain and splinting + narcotics ?  Worsening R atx/collapse on CXR.  Reintubated overnight with severe tracheomalacia noted. PLAN -   - PS as tolerated, no extubation.  - F/u CXR.  - Keep fluid status even at this point.  - Continue versed and fentanyl.  - Start seroquel and precedex today for agitation.  RUL PNA -- v mass.  Ecoli in previous sputum  Lab 01/25/12 0353 01/24/12 0413 01/23/12 0430 01/22/12 0530 01/21/12 0440  WBC 9.2 11.3* 12.0* 15.6* 16.2*  PLAN -   - Repeat sputum culture negative from BAL  from 4/1 but negative so far.  - Brush and BAL cytology negative for malignancy.  - D/C vanc, continue zosyn for now until respiratory status is stabelized.  - Will need biopsy of RUL mass once respiratory status is stabelized.  Agitation - Continue versed and fentanyl for now, propofol was effective but d/ced after 3 days of usage.  Acute renal failure -- non oliguric.  SCr trending downward. Renal u/s negative except for non obstructing bladder stone.  Cr is stable.   Lab 01/25/12 0353 01/24/12 0413 01/23/12 0430 01/22/12 0530 01/21/12 0440  CREATININE 2.04* 2.18* 2.46* 2.45* 2.65*  PLAN -   - F/u chem.  - KVO IVF.  - One dose of lasix today.  Rib fx/ pain  PLAN -  Fent gtt  for now Will need pain control and aggressive pulm hygiene when extubated  HTN --  PLAN -  Low dose clonidine 4/1, may also help some with agitation   Rash: ?fungal, will start topical anti-fungal.  Prophylaxis - lovenox, protonix  Daughter expressed interest in a second opinion regarding need for a trach.  Given circumstances will not extubate patient today, keep as fluid even as possible and a second opinion can be rendered on Monday when another PCCM MD sees patient.  Will not plan trach until then.  CC time 35 minutes.  Koren Bound, M.D. [phone]

## 2012-01-26 ENCOUNTER — Inpatient Hospital Stay (HOSPITAL_COMMUNITY): Payer: Medicaid Other

## 2012-01-26 LAB — CBC
Hemoglobin: 7.7 g/dL — ABNORMAL LOW (ref 12.0–15.0)
MCH: 28.2 pg (ref 26.0–34.0)
MCV: 86.1 fL (ref 78.0–100.0)
Platelets: 400 10*3/uL (ref 150–400)
RBC: 2.73 MIL/uL — ABNORMAL LOW (ref 3.87–5.11)

## 2012-01-26 LAB — BASIC METABOLIC PANEL
CO2: 32 mEq/L (ref 19–32)
Calcium: 8.8 mg/dL (ref 8.4–10.5)
Creatinine, Ser: 1.74 mg/dL — ABNORMAL HIGH (ref 0.50–1.10)
Glucose, Bld: 101 mg/dL — ABNORMAL HIGH (ref 70–99)

## 2012-01-26 LAB — LEGIONELLA CULTURE

## 2012-01-26 LAB — GLUCOSE, CAPILLARY
Glucose-Capillary: 98 mg/dL (ref 70–99)
Glucose-Capillary: 113 mg/dL — ABNORMAL HIGH (ref 70–99)

## 2012-01-26 MED ORDER — ALTEPLASE 100 MG IV SOLR
6.0000 mg | Freq: Once | INTRAVENOUS | Status: AC
  Administered 2012-01-26: 4 mg
  Filled 2012-01-26: qty 6

## 2012-01-26 MED ORDER — ENOXAPARIN SODIUM 40 MG/0.4ML ~~LOC~~ SOLN
40.0000 mg | SUBCUTANEOUS | Status: DC
  Administered 2012-01-27 – 2012-02-13 (×17): 40 mg via SUBCUTANEOUS
  Filled 2012-01-26 (×22): qty 0.4

## 2012-01-26 MED ORDER — CLONAZEPAM 1 MG PO TABS
1.0000 mg | ORAL_TABLET | Freq: Two times a day (BID) | ORAL | Status: DC
  Administered 2012-01-26 – 2012-02-05 (×21): 1 mg
  Filled 2012-01-26 (×21): qty 1

## 2012-01-26 MED ORDER — ALTEPLASE 100 MG IV SOLR: 2.0000 mg | Freq: Once | INTRAVENOUS | Status: DC

## 2012-01-26 MED ORDER — DEXMEDETOMIDINE HCL 100 MCG/ML IV SOLN
0.2000 ug/kg/h | INTRAVENOUS | Status: DC
  Administered 2012-01-26 – 2012-01-27 (×3): 1 ug/kg/h via INTRAVENOUS
  Administered 2012-01-27: 0.5 ug/kg/h via INTRAVENOUS
  Administered 2012-01-27 – 2012-01-28 (×3): 1 ug/kg/h via INTRAVENOUS
  Administered 2012-01-28: 1.2 ug/kg/h via INTRAVENOUS
  Administered 2012-01-28 – 2012-01-31 (×3): 1 ug/kg/h via INTRAVENOUS
  Filled 2012-01-26 (×18): qty 4

## 2012-01-26 MED ORDER — CLONAZEPAM 0.1 MG/ML ORAL SUSPENSION
1.0000 mg | Freq: Two times a day (BID) | ORAL | Status: DC
  Filled 2012-01-26 (×2): qty 10

## 2012-01-26 NOTE — Progress Notes (Signed)
Name: Emily Harrington MRN: [id number] DOB: [birth year]    LOS: 18  PCCM FOLLOW UP NOTE  History of Present Illness: 61 year old female presented 3/20 with strep bacteremia ,PEA arrest requiring CPR was intubated enroute to the ED. Extubated 3/23 and moved to SDU, PCCM signed off 3/27.   Cont to have difficulty with SOB and pain management and 3/30 developed worsening lethargy and resp distress and reintubated for hypercarbic resp failure ? Narcotic induced  Lines / Drains: L [place] TLC 3/20>>> 3/24 R femoral a-line 3/20>>> 3/22 ET tube 3/20>> 3/23 ETT 3/30>>>  Cultures: Blood 3/20 >> 1/2 pneumococcus Sputum 3/20>>> few E coli pan sensitive Urine 3/20>>> NEG cdiff  Neg 3/22 Sputum 3/30>>>candida albicans FOB BAL 4/1>>>  Antibiotics: Vancomycin 3/20>>> 3/23, 3/30 (clinical worsening)>>4/5 Zosyn 3/20>>> 3/23, 3/30 (clinical worsening ) >> Levofloxa 3/21 >> 3/30  Tests / Events: Cardiac arrest in the ED 3/20 3/20 ct abdo/pelvis- neg acute 3/20 Ct chest -Large area of airspace consolidation involving the right upper<BR>lobe most likely represents bacterial pneumonia. This will need<BR>interval radiographic followup to confirm resolution and to exclude<BR>an underlying neoplastic process which may have a similar<BR>appearance 3/20- EGDT 3/21 off pressors 3/30 reintubated 4/1 Brush and BAL cytology negative for malignancy.  SUBJ: Reintubated overnight with severe tracheomalacia noted.  Vital Signs: BP 121/49  Pulse 80  Temp(Src) 97.8 F (36.6 C) (Oral)  Resp 24  Ht 5\' 5"  (1.651 m)  Wt 89.3 kg (196 lb 13.9 oz)  BMI 32.76 kg/m2  SpO2 97%  Intake/Output Summary (Last 24 hours) at 01/26/12 0916 Last data filed at 01/26/12 0700  Gross per 24 hour  Intake 2748.85 ml  Output   4300 ml  Net -1551.15 ml   Physical Examination: General:  Chronically ill female NAD erythematous rash on chest and torso. Neuro: awake, easily agitated on fent/propofol, MAE, gen weakness HEENT:   Mm dry, no jvd, ETT Cardiovascular:  RRR, Nl S1/S2, -M/R/G mild tachy  Lungs: resps even non labored on vent, diminished R>L, exp wheeze  Abdomen:  Soft, NT, ND and +BS. Musculoskeletal:  -edema  Skin:  Multiple scars, erythematous area over genitals, inner thighs.  Labs and Imaging:   CBC    Component Value Date/Time   WBC 6.9 01/26/2012 0400   RBC 2.73* 01/26/2012 0400   HGB 7.7* 01/26/2012 0400   HCT 23.5* 01/26/2012 0400   PLT 400 01/26/2012 0400   MCV 86.1 01/26/2012 0400   MCH 28.2 01/26/2012 0400   MCHC 32.8 01/26/2012 0400   RDW 15.4 01/26/2012 0400   LYMPHSABS 1.0 01/11/2012 0340   MONOABS 0.7 01/11/2012 0340   EOSABS 0.0 01/11/2012 0340   BASOSABS 0.0 01/11/2012 0340   BMET    Component Value Date/Time   NA 144 01/26/2012 0400   K 4.2 01/26/2012 0400   CL 102 01/26/2012 0400   CO2 32 01/26/2012 0400   GLUCOSE 101* 01/26/2012 0400   BUN 36* 01/26/2012 0400   CREATININE 1.74* 01/26/2012 0400   CALCIUM 8.8 01/26/2012 0400   GFRNONAA 30* 01/26/2012 0400   GFRAA 35* 01/26/2012 0400   Assessment and Plan: 61 yead old female with PMH of asthma who presents to the hospital after being found SOB and unresponsive. Patient likely has a RUL PNA vs mass. Intubated 3/23-3/25.  Developed worsening resp distress 3/30 and tx to ICU.  Severe tracheomalacia, intubated  Acute hypoxic resp failure - in setting RUL PNA v mass, underlying asthma and significant atelectasis/collapse in setting mult rib/sternal fx with  pain and splinting + narcotics ?  Worsening R atx/collapse on CXR.  Reintubated overnight with severe tracheomalacia noted. PLAN -   - PS as tolerated, no extubation.  - F/u CXR.  - Keep fluid status even at this point, no lasix will be given today.  - Continue versed and fentanyl.  - Started seroquel and precedex today for agitation with good effect but still requiring versed, will add klonipin today.  RUL PNA -- v mass.  Ecoli in previous sputum  Lab 01/26/12 0400 01/25/12 0353 01/24/12 0413 01/23/12  0430 01/22/12 0530  WBC 6.9 9.2 11.3* 12.0* 15.6*  PLAN -   - Repeat sputum culture negative from BAL from 4/1 but negative so far.  - Brush and BAL cytology negative for malignancy but do have atypical cells and pathology recommends better biopsy once able to (i.e. Respiratory status is improved).  - D/C vanc, continue zosyn for now until respiratory status is stabelized.  - Will need biopsy of RUL mass once respiratory status is stabelized.  Agitation - Continue versed and fentanyl for now, propofol was effective but d/ced after 3 days of usage.  Klonopin added.  Acute renal failure -- non oliguric.  SCr trending downward. Renal u/s negative except for non obstructing bladder stone.  Cr is stable.   Lab 01/26/12 0400 01/25/12 0353 01/24/12 0413 01/23/12 0430 01/22/12 0530  CREATININE 1.74* 2.04* 2.18* 2.46* 2.45*  PLAN -   - F/u chem.  - KVO IVF.  - One dose of lasix today.  Rib fx/ pain  PLAN -  Fent gtt  for now Will need pain control and aggressive pulm hygiene when extubated  HTN --  PLAN -  Low dose clonidine 4/1, may also help some with agitation   Rash: ?fungal, will start topical anti-fungal.  Prophylaxis - lovenox, protonix  Daughter expressed interest in a second opinion regarding need for a trach.  Given circumstances will not extubate patient today, keep as fluid even as possible and a second opinion can be rendered on Monday when another PCCM MD sees patient.  Will not plan trach until then.  CC time 35 minutes.  Koren Bound, M.D. [phone]

## 2012-01-26 NOTE — Progress Notes (Signed)
ANTIBIOTIC CONSULT NOTE - FOLLOW UP  Pharmacy Consult for Zosyn Indication: Empiric HAP coverage  Allergies  Allergen Reactions  . Nsaids     Patient Measurements: Height: 5\' 5"  (165.1 cm) Weight: 196 lb 13.9 oz (89.3 kg) IBW/kg (Calculated) : 57   Vital Signs: Temp: 98.3 F (36.8 C) (04/07 1122) Temp src: Oral (04/07 1122) BP: 116/45 mmHg (04/07 1208) Pulse Rate: 79  (04/07 1208) Intake/Output from previous day: 04/06 0701 - 04/07 0700 In: 2993 [I.V.:1644; NG/GT:1200; IV Piggyback:129] Out: 4725 [Urine:4225; Stool:500] Intake/Output from this shift: Total I/O In: 663.5 [I.V.:403.5; NG/GT:235; IV Piggyback:25] Out: 725 [Urine:725]  Labs:  [facility] 01/26/12 0400 01/25/12 0353 01/24/12 0413  WBC 6.9 9.2 11.3*  HGB 7.7* 8.1* 8.4*  PLT 400 401* 432*  LABCREA -- -- --  CREATININE 1.74* 2.04* 2.18*   Estimated Creatinine Clearance: 37 ml/min (by C-G formula based on Cr of 1.74). No results found for this basename: VANCOTROUGH:2,VANCOPEAK:2,VANCORANDOM:2,GENTTROUGH:2,GENTPEAK:2,GENTRANDOM:2,TOBRATROUGH:2,TOBRAPEAK:2,TOBRARND:2,AMIKACINPEAK:2,AMIKACINTROU:2,AMIKACIN:2, in the last 72 hours   Microbiology: Recent Results (from the past 720 hour(s))  CULTURE, RESPIRATORY     Status: Normal   Collection Time   01/08/12  4:27 PM      Component Value Range Status Comment   Specimen Description ENDOTRACHEAL   Final    Special Requests NONE   Final    Gram Stain     Final    Value: FEW WBC PRESENT,BOTH PMN AND MONONUCLEAR     RARE SQUAMOUS EPITHELIAL CELLS PRESENT     FEW GRAM POSITIVE COCCI IN PAIRS     RARE GRAM POSITIVE RODS     RARE GRAM NEGATIVE RODS   Culture FEW ESCHERICHIA COLI   Final    Report Status 01/11/2012 FINAL   Final    Organism ID, Bacteria ESCHERICHIA COLI   Final   MRSA PCR SCREENING     Status: Normal   Collection Time   01/08/12  5:48 PM      Component Value Range Status Comment   MRSA by PCR NEGATIVE  NEGATIVE  Final   CULTURE, BLOOD (ROUTINE X  2)     Status: Normal   Collection Time   01/08/12  5:50 PM      Component Value Range Status Comment   Specimen Description BLOOD ARM RIGHT   Final    Special Requests BOTTLES DRAWN AEROBIC AND ANAEROBIC 10CC   Final    Culture  Setup Time [id number]   Final    Culture     Final    Value: STREPTOCOCCUS PNEUMONIAE     Note: Gram Stain Report Called to,Read Back By and Verified With: JULIANNA CLAIR @ 1450 01/09/12 WICKN   Report Status 01/11/2012 FINAL   Final    Organism ID, Bacteria STREPTOCOCCUS PNEUMONIAE   Final   CULTURE, BLOOD (ROUTINE X 2)     Status: Normal   Collection Time   01/08/12  6:00 PM      Component Value Range Status Comment   Specimen Description BLOOD ARM LEFT   Final    Special Requests BOTTLES DRAWN AEROBIC ONLY 10CC   Final    Culture  Setup Time [id number]   Final    Culture NO GROWTH 5 DAYS   Final    Report Status 01/15/2012 FINAL   Final   URINE CULTURE     Status: Normal   Collection Time   01/08/12  8:46 PM      Component Value Range Status Comment   Specimen Description URINE, CATHETERIZED  Final    Special Requests NONE   Final    Culture  Setup Time [id number]   Final    Colony Count NO GROWTH   Final    Culture NO GROWTH   Final    Report Status 01/09/2012 FINAL   Final   CLOSTRIDIUM DIFFICILE BY PCR     Status: Normal   Collection Time   01/10/12  3:51 AM      Component Value Range Status Comment   C difficile by pcr NEGATIVE  NEGATIVE  Final   CULTURE, RESPIRATORY     Status: Normal   Collection Time   01/18/12  1:17 PM      Component Value Range Status Comment   Specimen Description TRACHEAL ASPIRATE   Final    Special Requests NONE   Final    Gram Stain     Final    Value: FEW WBC PRESENT, PREDOMINANTLY PMN     FEW SQUAMOUS EPITHELIAL CELLS PRESENT     RARE GRAM POSITIVE COCCI IN PAIRS     RARE GRAM VARIABLE ROD   Culture FEW CANDIDA ALBICANS   Final    Report Status 01/20/2012 FINAL   Final   MRSA PCR SCREENING     Status:  Normal   Collection Time   01/18/12  1:22 PM      Component Value Range Status Comment   MRSA by PCR NEGATIVE  NEGATIVE  Final   AFB CULTURE WITH SMEAR     Status: Normal (Preliminary result)   Collection Time   01/20/12  1:59 PM      Component Value Range Status Comment   Specimen Description BRONCHIAL WASHINGS   Final    Special Requests RLL   Final    ACID FAST SMEAR NO ACID FAST BACILLI SEEN   Final    Culture     Final    Value: CULTURE WILL BE EXAMINED FOR 6 WEEKS BEFORE ISSUING A FINAL REPORT   Report Status PENDING   Incomplete   FUNGUS CULTURE W SMEAR     Status: Normal (Preliminary result)   Collection Time   01/20/12  1:59 PM      Component Value Range Status Comment   Specimen Description BRONCHIAL WASHINGS   Final    Special Requests RLL   Final    Fungal Smear NO YEAST OR FUNGAL ELEMENTS SEEN   Final    Culture CULTURE IN PROGRESS FOR FOUR WEEKS   Final    Report Status PENDING   Incomplete   LEGIONELLA CULTURE     Status: Normal   Collection Time   01/20/12  1:59 PM      Component Value Range Status Comment   Specimen Description BRONCHIAL WASHINGS   Final    Special Requests RLL   Final    Culture NO LEGIONELLA ISOLATED   Final    Report Status 01/26/2012 FINAL   Final   CULTURE, BAL-QUANTITATIVE     Status: Normal   Collection Time   01/20/12  2:02 PM      Component Value Range Status Comment   Specimen Description BRONCHIAL WASHINGS   Final    Special Requests RLL   Final    Gram Stain     Final    Value: FEW WBC PRESENT,BOTH PMN AND MONONUCLEAR     NO SQUAMOUS EPITHELIAL CELLS SEEN     NO ORGANISMS SEEN   Colony Count 200 COLONIES/ML   Final    Culture  CANDIDA ALBICANS   Final    Report Status 01/23/2012 FINAL   Final   CULTURE, RESPIRATORY     Status: Normal   Collection Time   01/20/12  2:02 PM      Component Value Range Status Comment   Specimen Description BRONCHIAL WASHINGS   Final    Special Requests RLL   Final    Gram Stain     Final    Value: FEW  WBC PRESENT,BOTH PMN AND MONONUCLEAR     NO SQUAMOUS EPITHELIAL CELLS SEEN     NO ORGANISMS SEEN   Culture NO GROWTH 2 DAYS   Final    Report Status 01/22/2012 FINAL   Final   CLOSTRIDIUM DIFFICILE BY PCR     Status: Normal   Collection Time   01/21/12  8:46 PM      Component Value Range Status Comment   C difficile by pcr NEGATIVE  NEGATIVE  Final     Anti-infectives     Start     Dose/Rate Route Frequency Ordered Stop   01/22/12 1800   vancomycin (VANCOCIN) 750 mg in sodium chloride 0.9 % 150 mL IVPB  Status:  Discontinued        750 mg 150 mL/hr over 60 Minutes Intravenous Every 24 hours 01/22/12 1351 01/23/12 0909   01/18/12 1300   vancomycin (VANCOCIN) IVPB 1000 mg/200 mL premix  Status:  Discontinued        1,000 mg 200 mL/hr over 60 Minutes Intravenous Every 24 hours 01/18/12 1215 01/22/12 1350   01/18/12 1300   piperacillin-tazobactam (ZOSYN) IVPB 3.375 g        3.375 g 12.5 mL/hr over 240 Minutes Intravenous Every 8 hours 01/18/12 1215     01/13/12 1000   levofloxacin (LEVAQUIN) tablet 250 mg  Status:  Discontinued        250 mg Oral Daily 01/12/12 1150 01/18/12 1145   01/12/12 1200   levofloxacin (LEVAQUIN) tablet 500 mg        500 mg Oral  Once 01/12/12 1135 01/12/12 1250   01/12/12 1200   levofloxacin (LEVAQUIN) tablet 250 mg  Status:  Discontinued        250 mg Oral Daily 01/12/12 1149 01/12/12 1150   01/09/12 1100   Levofloxacin (LEVAQUIN) IVPB 750 mg  Status:  Discontinued        750 mg 100 mL/hr over 90 Minutes Intravenous Every 48 hours 01/09/12 1028 01/12/12 1133   01/09/12 1000   vancomycin (VANCOCIN) IVPB 1000 mg/200 mL premix  Status:  Discontinued        1,000 mg 200 mL/hr over 60 Minutes Intravenous Every 24 hours 01/09/12 1403 01/11/12 0956   01/08/12 2300   piperacillin-tazobactam (ZOSYN) IVPB 3.375 g  Status:  Discontinued        3.375 g 12.5 mL/hr over 240 Minutes Intravenous Every 8 hours 01/08/12 1659 01/11/12 0956   01/08/12 1800   vancomycin  (VANCOCIN) 1,500 mg in sodium chloride 0.9 % 500 mL IVPB  Status:  Discontinued        1,500 mg 250 mL/hr over 120 Minutes Intravenous Every 12 hours 01/08/12 1659 01/09/12 1403   01/08/12 1700   piperacillin-tazobactam (ZOSYN) IVPB 3.375 g        3.375 g 100 mL/hr over 30 Minutes Intravenous  Once 01/08/12 1659 01/08/12 2043          Assessment: 61 y.o. F on Zosyn D#9 for empiric HAP. Repeat respiratory cultures show no growth  and repeat BAL cultures show few candida albicans. Renal function continues to improve. The patient is afebrile and WBC continue to trend down.   Plan:  1.  Continue Zosyn 3.375 gm IV q8hr (each dose over 4 hours) - ?LOT planned. 2. Increase Lovenox to 40mg  sq q24h for improved renal function  Junita Push, PharmD, BCPS 01/26/2012 2:44 PM

## 2012-01-26 NOTE — Progress Notes (Signed)
7ml/tpa instilled to blue and brown ports due to no blood return, pharmacy entered tpa charge wrong, notified by staff rn of error, pharmacy to correct problem

## 2012-01-26 NOTE — Progress Notes (Signed)
tpa to blue and brown port removed without difficulty with 56ml/w, ports flushed with 9ml/ns and capped

## 2012-01-27 ENCOUNTER — Inpatient Hospital Stay (HOSPITAL_COMMUNITY): Payer: Medicaid Other

## 2012-01-27 DIAGNOSIS — J9600 Acute respiratory failure, unspecified whether with hypoxia or hypercapnia: Secondary | ICD-10-CM

## 2012-01-27 DIAGNOSIS — J13 Pneumonia due to Streptococcus pneumoniae: Secondary | ICD-10-CM

## 2012-01-27 DIAGNOSIS — J988 Other specified respiratory disorders: Secondary | ICD-10-CM

## 2012-01-27 DIAGNOSIS — Z9911 Dependence on respirator [ventilator] status: Secondary | ICD-10-CM

## 2012-01-27 DIAGNOSIS — J398 Other specified diseases of upper respiratory tract: Secondary | ICD-10-CM

## 2012-01-27 LAB — GLUCOSE, CAPILLARY: Glucose-Capillary: 126 mg/dL — ABNORMAL HIGH (ref 70–99)

## 2012-01-27 LAB — CBC
HCT: 25.6 % — ABNORMAL LOW (ref 36.0–46.0)
Hemoglobin: 8.2 g/dL — ABNORMAL LOW (ref 12.0–15.0)
WBC: 8.3 10*3/uL (ref 4.0–10.5)

## 2012-01-27 LAB — POCT I-STAT 3, ART BLOOD GAS (G3+)
O2 Saturation: 98 %
Patient temperature: 99.1
pCO2 arterial: 45.8 mmHg — ABNORMAL HIGH (ref 35.0–45.0)
pH, Arterial: 7.401 — ABNORMAL HIGH (ref 7.350–7.400)

## 2012-01-27 LAB — PHOSPHORUS: Phosphorus: 3.4 mg/dL (ref 2.3–4.6)

## 2012-01-27 LAB — BASIC METABOLIC PANEL
BUN: 36 mg/dL — ABNORMAL HIGH (ref 6–23)
Chloride: 105 mEq/L (ref 96–112)
GFR calc Af Amer: 42 mL/min — ABNORMAL LOW (ref >90)
Glucose, Bld: 93 mg/dL (ref 70–99)
Potassium: 4 mEq/L (ref 3.5–5.1)

## 2012-01-27 LAB — IRON AND TIBC
Iron: 48 ug/dL (ref 42–135)
Saturation Ratios: 25 % (ref 20–55)
UIBC: 141 ug/dL (ref 125–400)

## 2012-01-27 LAB — FERRITIN: Ferritin: 160 ng/mL (ref 10–291)

## 2012-01-27 MED ORDER — PROPOFOL 10 MG/ML IV EMUL
5.0000 ug/kg/min | Freq: Once | INTRAVENOUS | Status: DC
  Filled 2012-01-27: qty 100

## 2012-01-27 MED ORDER — SODIUM CHLORIDE 0.9 % IJ SOLN
10.0000 mL | INTRAMUSCULAR | Status: DC | PRN
  Administered 2012-02-02 – 2012-02-13 (×17): 10 mL
  Administered 2012-02-14: 20 mL

## 2012-01-27 MED ORDER — IPRATROPIUM BROMIDE 0.02 % IN SOLN
0.5000 mg | RESPIRATORY_TRACT | Status: DC | PRN
  Administered 2012-02-07: 0.5 mg via RESPIRATORY_TRACT
  Filled 2012-01-27: qty 2.5

## 2012-01-27 MED ORDER — SODIUM CHLORIDE 0.9 % IJ SOLN
10.0000 mL | Freq: Two times a day (BID) | INTRAMUSCULAR | Status: DC
  Administered 2012-01-28: 10 mL
  Administered 2012-01-29: 20 mL
  Administered 2012-01-29 – 2012-01-31 (×4): 10 mL
  Administered 2012-01-31: 20 mL
  Administered 2012-02-01: 10 mL
  Administered 2012-02-01: 20 mL
  Administered 2012-02-02 – 2012-02-03 (×4): 10 mL
  Administered 2012-02-03: 20 mL
  Administered 2012-02-04: 10 mL
  Administered 2012-02-04: 30 mL
  Administered 2012-02-05 – 2012-02-06 (×3): 10 mL
  Administered 2012-02-06: 20 mL
  Administered 2012-02-07: 30 mL
  Administered 2012-02-07 – 2012-02-08 (×2): 10 mL
  Administered 2012-02-08: 3 mL

## 2012-01-27 MED ORDER — ETOMIDATE 2 MG/ML IV SOLN
40.0000 mg | Freq: Once | INTRAVENOUS | Status: AC
  Administered 2012-01-28: 30 mg via INTRAVENOUS
  Filled 2012-01-27: qty 20

## 2012-01-27 MED ORDER — MIDAZOLAM HCL 2 MG/2ML IJ SOLN
5.0000 mg | Freq: Once | INTRAMUSCULAR | Status: AC
  Administered 2012-01-28: 5 mg via INTRAVENOUS
  Filled 2012-01-27: qty 4

## 2012-01-27 MED ORDER — FENTANYL CITRATE 0.05 MG/ML IJ SOLN
200.0000 ug | Freq: Once | INTRAMUSCULAR | Status: AC
  Administered 2012-01-28: 200 ug via INTRAVENOUS
  Filled 2012-01-27: qty 4

## 2012-01-27 MED ORDER — VECURONIUM BROMIDE 10 MG IV SOLR
10.0000 mg | Freq: Once | INTRAVENOUS | Status: DC
  Filled 2012-01-27 (×2): qty 10

## 2012-01-27 MED ORDER — QUETIAPINE FUMARATE 25 MG PO TABS
25.0000 mg | ORAL_TABLET | Freq: Every day | ORAL | Status: DC
  Administered 2012-01-27 – 2012-01-28 (×2): 25 mg via ORAL
  Filled 2012-01-27 (×3): qty 1

## 2012-01-27 MED ORDER — ALBUTEROL SULFATE (5 MG/ML) 0.5% IN NEBU
2.5000 mg | INHALATION_SOLUTION | RESPIRATORY_TRACT | Status: DC | PRN
  Administered 2012-02-07: 2.5 mg via RESPIRATORY_TRACT
  Filled 2012-01-27: qty 0.5

## 2012-01-27 NOTE — Progress Notes (Signed)
Emily Harrington is a 61 y.o. female admitted on 01/08/2012 with Pneumococcal bacteremia, PEA arrest.  Transitioned to SDU 3/23.  Developed increasing dyspnea, and change in mental status and transfer back to ICU 3/30.  Line/tube: (Truncated 4/8) ETT 3/30>>4/4 ETT 4/4>> Lt IJ CVL 3/30>>  Cultures: (Truncated 4/8) Blood 3/20>>Pneumococcus Sputum 3/20>>E coli BAL 4/1>>Candida C diff 4/2>>negative  Antibiotics: (Truncated 4/8) Zosyn 3/30>>4/8 Vancomycin 3/30>>4/3  Tests/events: 3/20 Echo>>EF 55 to 60%, mild LVH 3/20 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 3/20 CT abd/pelvis>>Large Rt renal pelvis stone, avascular necrosis Lt femoral head 3/22 EEG>>non-specific slow rhythm, no epileptiform activity 3/27 Renal u/s>>Large Rt renal pelvis stone w/o hydronephrosis 3/29 CT chest>>decrease RUL ASD, patchy LUL ASD/ATX, b/l effusions, multiple rib fx's and probable sternal fx 3/29 Doppler legs>>no DVT in either Rt or Lt legs 4/1 Bronchoscopy 4/4 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  OBJECTIVE:  Blood pressure 149/62, pulse 106, temperature 98.7 F (37.1 C), temperature source Oral, resp. rate 20, height 5\' 5"  (1.651 m), weight 205 lb 4 oz (93.1 kg), SpO2 98.00%. Wt Readings from Last 3 Encounters:  01/27/12 205 lb 4 oz (93.1 kg)   Body mass index is 34.16 kg/(m^2).  I/O last 3 completed shifts: In: 4436.6 [I.V.:2876.6; Other:20; NG/GT:1290; IV Piggyback:250] Out: 4835 [Urine:4185; Stool:650]  Vent Mode:  [-] CPAP FiO2 (%):  [30 %-30.2 %] 30 % Set Rate:  [14 bmp] 14 bmp Vt Set:  [450 mL] 450 mL PEEP:  [5 cmH20] 5 cmH20 Pressure Support:  [5 cmH20-12 cmH20] 5 cmH20 Plateau Pressure:  [13 cmH20-20 cmH20] 20 cmH20  Physical Exam: General - no distress HEENT - ETT in place Cardiac - s1s2 regular, no murmur Chest - scattered rhonchi, no wheeze Abd - soft, nontender, + bowel sounds GU - foley in place Ext - no edema Neuro - follows simple commands  CBC    Component  Value Date/Time   WBC 8.3 01/27/2012 0400   RBC 2.95* 01/27/2012 0400   HGB 8.2* 01/27/2012 0400   HCT 25.6* 01/27/2012 0400   PLT 357 01/27/2012 0400   MCV 86.8 01/27/2012 0400   MCH 27.8 01/27/2012 0400   MCHC 32.0 01/27/2012 0400   RDW 15.4 01/27/2012 0400   LYMPHSABS 1.0 01/11/2012 0340   MONOABS 0.7 01/11/2012 0340   EOSABS 0.0 01/11/2012 0340   BASOSABS 0.0 01/11/2012 0340    BMET    Component Value Date/Time   NA 144 01/27/2012 0400   K 4.0 01/27/2012 0400   CL 105 01/27/2012 0400   CO2 28 01/27/2012 0400   GLUCOSE 93 01/27/2012 0400   BUN 36* 01/27/2012 0400   CREATININE 1.50* 01/27/2012 0400   CALCIUM 9.1 01/27/2012 0400   GFRNONAA 36* 01/27/2012 0400   GFRAA 42* 01/27/2012 0400    Dg Chest Port 1 View  01/27/2012  *RADIOLOGY REPORT*  Clinical Data: Respiratory failure  PORTABLE CHEST - 1 VIEW  Comparison: 01/26/2012  Findings: Right upper lobe peripheral wedge shaped consolidation is stable.  Endotracheal tube, NG tube, left neck vascular catheter are stable.  Bibasilar atelectasis is stable.  Small left pleural effusion.  No sign of interstitial edema.  No pneumothorax.  IMPRESSION: Stable.  Basilar atelectasis.  Peripheral right upper lobe consolidation.  Original Report Authenticated By: Donavan Burnet, M.D.   Dg Chest Port 1 View  01/26/2012  *RADIOLOGY REPORT*  Clinical Data: Evaluate ET tube placement  PORTABLE CHEST - 1 VIEW  Comparison: 01/25/2012  Findings: There is a left IJ catheter with tip in the  SVC.  There is a ET tube with tip above the carina.  Heart size appears normal.  There are low lung volumes.  Diminished aeration to both lung bases is noted, unchanged from previous exam.  IMPRESSION:  1.  Stable support device. 2.  No change in aeration to the lung bases compared with previous exam.  Original Report Authenticated By: Rosealee Albee, M.D.    ASSESSMENT/PLAN:  Acute respiratory failure initially from Pneumococcal PNA/bacteremia, and E coli PNA.  Multiple rib fx after CPR.  Now with  tracheomalacia limiting ability to proceed with extubation.  Has hx of asthma -pt and daughter are agreeable to tracheostomy -pressure support wean as tolerated -f/u CXR -change to prn bronchodilators -D10/10 zosyn  Acute renal failure >> resolved  Protein calorie malnutrition -continue tube feeds  Agitated delirium -continue schedule klonopin, and wean off versed gtt as tolerated -wean off seroquel as tolerated -continue precedex for now  HTN -continue catapres  Hyperglycemia -SSI  Candidal dermatitis -nystatin powder  Anemia -check anemia panel -f/u CBC -transfuse for Hb < 7  Best practice -lovenox for DVT proph -protonix for SUP  Disposition -continue with PT -keep foley in -likely can wean off vent quickly after tracheostomy -will have IV team place PICC line, and then d/c IJ CVL  Updated family at bedside.  Of note, pt's son is reluctant to communicate directly with medical team about decision making.  Critical care time 35 minutes.  Ysidra Sopher Pager:  [phone] 01/27/2012, 10:20 AM

## 2012-01-27 NOTE — Progress Notes (Signed)
Physical Therapy Treatment Patient Details Name: Emily Harrington MRN: [id number] DOB: May 20, 1951 Today's Date: 01/27/2012  PT Assessment/Plan  PT - Assessment/Plan Comments on Treatment Session: Patient admitted with respiratory issues.  Patient had to be re-intubated on 4/3 after being extubated.  Currently still on ventilator.  Tolerated EOB for several minutes.  Sitting EOB was poor overall with constant need for assistance.  Patient does get anxious.   Patient will need 24 hour care at d/c.   PT Plan: Discharge plan remains appropriate;Frequency remains appropriate PT Frequency: Min 3X/week Recommendations for Other Services: OT consult Follow Up Recommendations: Skilled nursing facility;Supervision/Assistance - 24 hour Equipment Recommended: Defer to next venue PT Goals  Acute Rehab PT Goals PT Goal Formulation: With patient PT Goal: Supine/Side to Sit - Progress: Progressing toward goal PT Goal: Sit at Edge Of Bed - Progress: Progressing toward goal  PT Treatment Precautions/Restrictions  Precautions Precautions: Fall Precaution Comments: Sternum very sore secondary to chest compressions  Required Braces or Orthoses: No Restrictions Weight Bearing Restrictions: No Mobility (including Balance) Bed Mobility Supine to Sit: 1: +2 Total assist;Patient percentage (comment);HOB elevated (Comment degrees) (HOB 30 degrees; pt = 50%) Supine to Sit Details (indicate cue type and reason): cues needed to facilitate patient to EOB and assist at trunk to assist patient patient supine to sit Sit to Supine: 1: +2 Total assist;Patient percentage (comment);HOB flat (pt = 50%) Sit to Supine - Details (indicate cue type and reason): Patient became anxious as well with lying down and needed incr assist.  Kicking LEs once in bed as well.  Reassured patient that she would feel better once we raised bed and got her settled.  Nurse and PT assisted with changing pad under patient. Transfers Sit to  Stand: Not tested (comment) Stand to Sit: Not tested (comment) Stand Pivot Transfers: Not tested (comment) Squat Pivot Transfers: Not tested (comment) Ambulation/Gait Ambulation/Gait Assistance: Not tested (comment) Stairs: No Wheelchair Mobility Wheelchair Mobility: No  Posture/Postural Control Posture/Postural Control: Postural limitations Postural Limitations: Patient sits with flexed forward head and neck in sitting and could not extend neck fully even with assist with [name].  Patient maintains kyphotic thoracic spine and forward head and neck the entire time.   Static Sitting Balance Static Sitting - Balance Support: Bilateral upper extremity supported;Feet supported Static Sitting - Level of Assistance: 3: Mod assist Static Sitting - Comment/# of Minutes: min to mod assist to sit EOB.  Patient leaning forward secondary to flexed neck and head and therefore losing balance anteriorly.  sat total of 6 minutes   End of Session PT - End of Session Equipment Utilized During Treatment: Gait belt Activity Tolerance: Patient limited by fatigue;Patient limited by pain;Treatment limited secondary to medical complications (Comment) (HR up to 143) Patient left: in bed;with call bell in reach;with restraints reapplied Nurse Communication: Mobility status for transfers;Need for lift equipment General Behavior During Session: [facility] for tasks performed Cognition: [facility] for tasks performed  INGOLD,Theta Leaf 01/27/2012, 1:38 PM  [facility] [phone] (pager)

## 2012-01-27 NOTE — Progress Notes (Signed)
Clinical Social Worker reviewed chart and spoke with RN.  CSW noted that pt currently resting.  CSW phoned pt's dtr who expressed that all of her questions/concerns were addressed.  Dtr did mention that she was interested in following up with Director of unit 2300 regarding pt's dentures.  CSW began conversation related to SNF at dc.  CSW to continue to follow and assist as needed.   Angelia Mould, MSW, [place] [phone]

## 2012-01-27 NOTE — Significant Event (Addendum)
1823pm: noted drainage on Left IJ central line dressing and found that central line was pulled out. All maintenance fluids via central line stopped. Unable to draw back blood via IJ central line. MD on call at [facility] made aware. Pt had PICC placed via right upper arm. 1925pm-new IV tubes changed and switched to infuse via PICC. Pt is being monitored. Shelsie Tijerino, Charity fundraiser.

## 2012-01-28 ENCOUNTER — Inpatient Hospital Stay (HOSPITAL_COMMUNITY): Payer: Medicaid Other

## 2012-01-28 DIAGNOSIS — J45901 Unspecified asthma with (acute) exacerbation: Secondary | ICD-10-CM

## 2012-01-28 DIAGNOSIS — N170 Acute kidney failure with tubular necrosis: Secondary | ICD-10-CM

## 2012-01-28 LAB — BASIC METABOLIC PANEL
BUN: 33 mg/dL — ABNORMAL HIGH (ref 6–23)
CO2: 29 mEq/L (ref 19–32)
Calcium: 9.3 mg/dL (ref 8.4–10.5)
Creatinine, Ser: 1.29 mg/dL — ABNORMAL HIGH (ref 0.50–1.10)
GFR calc non Af Amer: 43 mL/min — ABNORMAL LOW (ref >90)
Glucose, Bld: 108 mg/dL — ABNORMAL HIGH (ref 70–99)

## 2012-01-28 LAB — GLUCOSE, CAPILLARY: Glucose-Capillary: 77 mg/dL (ref 70–99)

## 2012-01-28 LAB — APTT: aPTT: 33 seconds (ref 24–37)

## 2012-01-28 LAB — CBC
MCH: 27.4 pg (ref 26.0–34.0)
MCHC: 31.5 g/dL (ref 30.0–36.0)
MCV: 87 fL (ref 78.0–100.0)
Platelets: 337 10*3/uL (ref 150–400)
RDW: 15.4 % (ref 11.5–15.5)

## 2012-01-28 MED ORDER — MIDAZOLAM HCL 2 MG/2ML IJ SOLN
INTRAMUSCULAR | Status: AC
  Filled 2012-01-28: qty 6

## 2012-01-28 MED ORDER — FENTANYL CITRATE 0.05 MG/ML IJ SOLN
INTRAMUSCULAR | Status: AC
  Filled 2012-01-28: qty 4

## 2012-01-28 MED ORDER — ETOMIDATE 2 MG/ML IV SOLN
INTRAVENOUS | Status: AC
  Filled 2012-01-28: qty 20

## 2012-01-28 MED ORDER — FENTANYL CITRATE 0.05 MG/ML IJ SOLN
25.0000 ug | INTRAMUSCULAR | Status: AC | PRN
  Administered 2012-01-28 – 2012-01-29 (×3): 50 ug via INTRAVENOUS
  Filled 2012-01-28: qty 2

## 2012-01-28 MED ORDER — FENTANYL CITRATE 0.05 MG/ML IJ SOLN
100.0000 ug | Freq: Once | INTRAMUSCULAR | Status: AC
  Administered 2012-01-28: 100 ug via INTRAVENOUS

## 2012-01-28 NOTE — Procedures (Signed)
Bronchoscopy Procedure Note Emily Harrington [id number] 1951-01-22  Procedure: Bronchoscopy Indications: Diagnostic evaluation of the airways and Obtain specimens for culture and/or other diagnostic studies  Procedure Details Consent: Risks of procedure as well as the alternatives and risks of each were explained to the (patient/caregiver).  Consent for procedure obtained. Time Out: Verified patient identification, verified procedure, site/side was marked, verified correct patient position, special equipment/implants available, medications/allergies/relevent history reviewed, required imaging and test results available.  Performed  In preparation for procedure, patient was given 100% FiO2 and bronchoscope lubricated. Sedation: Benzodiazepines and Etomidate  Airway entered and the following bronchi were examined: RUL, RML, RLL, LUL and LLL.   Procedures performed: Brushings performed Bronchoscope removed.    Evaluation Hemodynamic Status: BP stable throughout; O2 sats: stable throughout Patient's Current Condition: stable Specimens:  Sent serosanguinous fluid Complications: No apparent complications Patient did tolerate procedure well.  Procedure performed to assist with bedside percutaneous tracheostomy insertion, and to perform right upper lobe BAL.  Bronchoscope entered through ETT.  Good visualization of airway during procedure.  Noted to have easy collapse airways from trachea down to segmental bronchi.  Tracheostomy insertion visualized with no evidence for airway trauma, or significant bleeding.  Bronchoscope then withdrawn, and ventilator switched to tracheostomy.  Bronchoscope entered through tracheostomy with good position above carina, and no significant bleeding.  Bronchoscope then wedged into posterior segment of right upper lobe.  Instilled 60 ml of saline with 10 ml of pink fluid returned.  Bronchoscope withdrawn.  Will send Rt upper lobe BAL for  cytology.  Emily Harrington 01/28/2012

## 2012-01-28 NOTE — Progress Notes (Signed)
All charting, medication administration, patient care and assessments by Amy V-Smith, SN [employer] have been supervised and verified to be correct by this RN

## 2012-01-28 NOTE — Procedures (Signed)
Bedside Tracheostomy Insertion Procedure Note   Patient Details:   Name: Emily Harrington DOB: 1951-10-20 MRN: [id number]  Procedure: Tracheostomy  Pre Procedure Assessment: ET Tube secured at lip (cm):18 Bite block in place: No Breath Sounds: Clear  Post Procedure Assessment: BP 140/55  Pulse 71  Temp(Src) 97.9 F (36.6 C) (Axillary)  Resp 14  Ht 5\' 5"  (1.651 m)  Wt 190 lb 14.7 oz (86.6 kg)  BMI 31.77 kg/m2  SpO2 98% O2 sats: stable throughout Complications: No apparent complications Patient did tolerate procedure well Tracheostomy Brand:Shiley Tracheostomy Style:Cuffed Tracheostomy Size: 6 Tracheostomy Secured WGN:[id number] Tracheostomy Placement Confirmation:Trach cuff visualized and in place and Chest X ray ordered for placement    Leonard Downing 01/28/2012, 12:15 PM

## 2012-01-28 NOTE — Consult Note (Signed)
WOC follow up  Pt seen today for follow up after placement of InterDry AG+ in skin folds.  All areas are much improved and only slight redness of the buttocks still present would assume related to incontinence of stool. This pt did have flexiseal in place but currently this is out.  Will not continue the use of Interdry Ag+ at this time.  Recommended continued use of the zinc/nystatin cream for buttocks to prevent breakdown related to moisture.  Re consult if needed, will not follow at this time. Thanks  Zahira Brummond [employer], CWOCN [phone])

## 2012-01-28 NOTE — Progress Notes (Signed)
Clinical Social Worker met at bedside with pt's dtr; pt currently asleep.  CSW reviewed rehab options and explained that pt's options will be limited due to trach and financial source being medicaid-pending.  Dtr was understanding but expressed desire to have pt in [place] area due to proximity.  Dtr agreeable to pt being faxed to both [place] and [place] [facility] to assist with bed options.  CSW provided opportunity to address dtr's questions/concerns.  CSW to continue to follow and assist as needed.   Angelia Mould, MSW, [place] [phone]

## 2012-01-28 NOTE — Progress Notes (Signed)
Speech Pathology Note  Received order for PMSV and bedside swallow assessment. Will complete on 01/29/12.  Myra Rude, M.S.,CCC-SLP Pager [phone]

## 2012-01-28 NOTE — Progress Notes (Signed)
Emily Harrington is a 61 y.o. female admitted on 01/08/2012 with Pneumococcal bacteremia, PEA arrest.  Transitioned to SDU 3/23.  Developed increasing dyspnea, and change in mental status and transfer back to ICU 3/30.  Line/tube: (Truncated 4/8) ETT 3/30>>4/4 ETT 4/4>> Lt IJ CVL 3/30>>4/8 Rt PICC 4/8>>  Cultures: (Truncated 4/8) Blood 3/20>>Pneumococcus Sputum 3/20>>E coli BAL 4/1>>Candida C diff 4/2>>negative  Antibiotics: (Truncated 4/8) Zosyn 3/30>>4/8 Vancomycin 3/30>>4/3  Tests/events: 3/20 Echo>>EF 55 to 60%, mild LVH 3/20 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 3/20 CT abd/pelvis>>Large Rt renal pelvis stone, avascular necrosis Lt femoral head 3/22 EEG>>non-specific slow rhythm, no epileptiform activity 3/27 Renal u/s>>Large Rt renal pelvis stone w/o hydronephrosis 3/29 CT chest>>decrease RUL ASD, patchy LUL ASD/ATX, b/l effusions, multiple rib fx's and probable sternal fx 3/29 Doppler legs>>no DVT in either Rt or Lt legs 4/1 Bronchoscopy 4/4 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  OBJECTIVE:  Blood pressure 121/52, pulse 73, temperature 97.9 F (36.6 C), temperature source Axillary, resp. rate 20, height 5\' 5"  (1.651 m), weight 190 lb 14.7 oz (86.6 kg), SpO2 99.00%. Wt Readings from Last 3 Encounters:  01/28/12 190 lb 14.7 oz (86.6 kg)   Body mass index is 31.77 kg/(m^2).  I/O last 3 completed shifts: In: 2884.2 [I.V.:1754.2; NG/GT:1030; IV Piggyback:100] Out: 5225 [Urine:4725; Stool:500]  Vent Mode:  [-] PRVC FiO2 (%):  [29.7 %-30.4 %] 30 % Set Rate:  [14 bmp] 14 bmp Vt Set:  [450 mL] 450 mL PEEP:  [4.6 cmH20-5 cmH20] 5 cmH20 Pressure Support:  [5 cmH20] 5 cmH20 Plateau Pressure:  [17 cmH20-21 cmH20] 17 cmH20  Physical Exam: General - no distress HEENT - ETT in place Cardiac - s1s2 regular, no murmur Chest - scattered rhonchi, no wheeze Abd - soft, nontender, + bowel sounds GU - foley in place Ext - no edema Neuro -  follows simple commands  CBC    Component Value Date/Time   WBC 7.9 01/28/2012 0421   RBC 2.85* 01/28/2012 0421   HGB 7.8* 01/28/2012 0421   HCT 24.8* 01/28/2012 0421   PLT 337 01/28/2012 0421   MCV 87.0 01/28/2012 0421   MCH 27.4 01/28/2012 0421   MCHC 31.5 01/28/2012 0421   RDW 15.4 01/28/2012 0421   LYMPHSABS 1.0 01/11/2012 0340   MONOABS 0.7 01/11/2012 0340   EOSABS 0.0 01/11/2012 0340   BASOSABS 0.0 01/11/2012 0340    BMET    Component Value Date/Time   NA 144 01/28/2012 0421   K 3.6 01/28/2012 0421   CL 107 01/28/2012 0421   CO2 29 01/28/2012 0421   GLUCOSE 108* 01/28/2012 0421   BUN 33* 01/28/2012 0421   CREATININE 1.29* 01/28/2012 0421   CALCIUM 9.3 01/28/2012 0421   GFRNONAA 43* 01/28/2012 0421   GFRAA 50* 01/28/2012 0421    Dg Chest Port 1 View  01/27/2012  *RADIOLOGY REPORT*  Clinical Data: Respiratory failure  PORTABLE CHEST - 1 VIEW  Comparison: 01/26/2012  Findings: Right upper lobe peripheral wedge shaped consolidation is stable.  Endotracheal tube, NG tube, left neck vascular catheter are stable.  Bibasilar atelectasis is stable.  Small left pleural effusion.  No sign of interstitial edema.  No pneumothorax.  IMPRESSION: Stable.  Basilar atelectasis.  Peripheral right upper lobe consolidation.  Original Report Authenticated By: Donavan Burnet, M.D.    ASSESSMENT/PLAN:  Acute respiratory failure initially from Pneumococcal PNA/bacteremia, and E coli PNA.  Multiple rib fx after CPR.  Now with tracheomalacia limiting ability to proceed with extubation.  Has  hx of asthma -proceed with trach later today -can likely proceed with trach collar quickly after trach performed -f/u CXR -prn bronchodilators -completed course of antibiotics  Acute renal failure >> resolved  Protein calorie malnutrition -continue tube feeds  Agitated delirium -continue schedule klonopin, and wean off versed gtt as tolerated -wean off seroquel as tolerated -continue precedex for now -should be able to wean off IV  sedation quickly after trach  HTN -continue catapres  Hyperglycemia -SSI  Candidal dermatitis -nystatin powder  Anemia of chronic disease and critical illness -f/u CBC -transfuse for Hb < 7  Best practice -lovenox for DVT proph -protonix for SUP  Disposition -continue with PT -keep foley in -likely can wean off vent quickly after tracheostomy  Lucia Harm Pager:  [phone] 01/28/2012, 8:11 AM

## 2012-01-28 NOTE — Procedures (Signed)
Perc trach See full dictation shiley 6 placed Blood loss 8 cc Tolerated well  Mcarthur Rossetti. Tyson Alias, MD, FACP Pgr: [phone] [facility]

## 2012-01-29 ENCOUNTER — Inpatient Hospital Stay (HOSPITAL_COMMUNITY): Payer: Medicaid Other

## 2012-01-29 LAB — BASIC METABOLIC PANEL
BUN: 24 mg/dL — ABNORMAL HIGH (ref 6–23)
CO2: 29 mEq/L (ref 19–32)
Chloride: 111 mEq/L (ref 96–112)
GFR calc non Af Amer: 54 mL/min — ABNORMAL LOW (ref >90)
Glucose, Bld: 127 mg/dL — ABNORMAL HIGH (ref 70–99)
Potassium: 3.4 mEq/L — ABNORMAL LOW (ref 3.5–5.1)
Sodium: 146 mEq/L — ABNORMAL HIGH (ref 135–145)

## 2012-01-29 LAB — GLUCOSE, CAPILLARY
Glucose-Capillary: 111 mg/dL — ABNORMAL HIGH (ref 70–99)
Glucose-Capillary: 121 mg/dL — ABNORMAL HIGH (ref 70–99)
Glucose-Capillary: 80 mg/dL (ref 70–99)
Glucose-Capillary: 83 mg/dL (ref 70–99)

## 2012-01-29 LAB — CBC
HCT: 25.3 % — ABNORMAL LOW (ref 36.0–46.0)
Hemoglobin: 8.2 g/dL — ABNORMAL LOW (ref 12.0–15.0)
MCH: 27.8 pg (ref 26.0–34.0)
MCHC: 32.4 g/dL (ref 30.0–36.0)
RBC: 2.95 MIL/uL — ABNORMAL LOW (ref 3.87–5.11)

## 2012-01-29 MED ORDER — PRO-STAT SUGAR FREE PO LIQD
30.0000 mL | Freq: Two times a day (BID) | ORAL | Status: DC
  Administered 2012-01-29 – 2012-02-04 (×10): 30 mL
  Filled 2012-01-29 (×14): qty 30

## 2012-01-29 MED ORDER — FENTANYL CITRATE 0.05 MG/ML IJ SOLN
25.0000 ug | INTRAMUSCULAR | Status: DC | PRN
  Administered 2012-01-29 – 2012-02-05 (×9): 25 ug via INTRAVENOUS
  Filled 2012-01-29 (×7): qty 2

## 2012-01-29 MED ORDER — POTASSIUM CHLORIDE 20 MEQ/15ML (10%) PO LIQD
40.0000 meq | Freq: Once | ORAL | Status: AC
  Administered 2012-01-29: 40 meq via ORAL
  Filled 2012-01-29: qty 30

## 2012-01-29 MED ORDER — LORAZEPAM 2 MG/ML IJ SOLN
2.0000 mg | Freq: Once | INTRAMUSCULAR | Status: AC
  Administered 2012-01-29: 2 mg via INTRAVENOUS
  Filled 2012-01-29: qty 1

## 2012-01-29 NOTE — Progress Notes (Signed)
Entered patient's room and found panda pulled out and was coiled around her right arm. Don't feel as if she was attempting to pull out tube, but feel as if she accidentally got caught in the tube. Will replace it and resume tube feeds if the placement is in the stomach as per the prior study.

## 2012-01-29 NOTE — Significant Event (Addendum)
1030am: Noted that patient had removed panda and panda tube had coiled in mouth. Panda tube removed completed and reinserted without complications, via right nare. Braulio Conte, RN verified placement in stomach. Notified MD Sood; verbal order for KUB. 1248pm, spoke with MD Craige Cotta; verbal order to use Panda and continue tube feeds. Will monitor closely. Mikyla Schachter, Charity fundraiser.

## 2012-01-29 NOTE — Progress Notes (Signed)
Physical Therapy Treatment Patient Details Name: Emily Harrington MRN: [id number] DOB: [birth year] Today's Date: 01/29/2012  PT Assessment/Plan  PT - Assessment/Plan Comments on Treatment Session: Patient admitted with respiratory issues.  Patient had to be re=intubated on 4/3 after being extubated.  Trach in place.  On vent/trach with treatment.  Tolerated sitting again and transfer to chair today.  Patient anxiety decreases safety as well as endurance.  Continue PT to address issues. PT Plan: Discharge plan remains appropriate;Frequency remains appropriate PT Frequency: Min 3X/week Recommendations for Other Services: OT consult Follow Up Recommendations: Skilled nursing facility;Supervision/Assistance - 24 hour Equipment Recommended: Defer to next venue PT Goals  Acute Rehab PT Goals PT Goal: Supine/Side to Sit - Progress: Progressing toward goal PT Goal: Sit at Edge Of Bed - Progress: Progressing toward goal PT Goal: Sit to Stand - Progress: Progressing toward goal PT Goal: Stand to Sit - Progress: Progressing toward goal PT Transfer Goal: Bed to Chair/Chair to Bed - Progress: Progressing toward goal  PT Treatment Precautions/Restrictions  Precautions Precautions: Fall Precaution Comments: Sternum very sore secondary to chest compressions  Required Braces or Orthoses: No Restrictions Weight Bearing Restrictions: No Mobility (including Balance) Bed Mobility Supine to Sit: 1: +2 Total assist;Patient percentage (comment);HOB elevated (Comment degrees) (HOB at 30 degrees; pt = 50%) Supine to Sit Details (indicate cue type and reason): cues needed to facilitate to EOB and assist at trunk.   Sit to Supine: Not Tested (comment) Transfers Sit to Stand: 1: +2 Total assist;Patient percentage (comment);From elevated surface;With upper extremity assist;From bed (pt = 50%) Sit to Stand Details (indicate cue type and reason): cues for patient to scoot to edge of chair prior to attempting to  stand.  Patient required min assist to stabilize initially.  Patient unable to achieve full upright stance but did weight bear equally on feet in flexed posture.   Stand to Sit: 3: Mod assist;With upper extremity assist;With armrests;To chair/3-in-1 Stand to Sit Details: verbal and tactile cues for bil hand placement on armrests of chair.  verbal cues for patient to control descent to chair.   Stand Pivot Transfers: 1: +2 Total assist;Patient percentage (comment) (pt = 65%) Stand Pivot Transfer Details (indicate cue type and reason): Patient achieved a partial stand with forward flexed trunk and transferred bed to chair with cues and assist for weight shiftiing.  Attempted to sit prematurely needing guidance of hips into chair.  Patient anxious.   Squat Pivot Transfers: Not tested (comment) Ambulation/Gait Ambulation/Gait: No Stairs: No Wheelchair Mobility Wheelchair Mobility: No  Posture/Postural Control Posture/Postural Control: Postural limitations Postural Limitations: Patient had slightly better head and neck control today.  Was able to hold head semi-upright without cues or assist.  Patient continues to maintain kyphotic thoracic spine.   Static Sitting Balance Static Sitting - Balance Support: Bilateral upper extremity supported;Feet supported Static Sitting - Level of Assistance: 3: Mod assist Static Sitting - Comment/# of Minutes: min to mod assist to sit EOB.  Patient leaning forward secondary to flexed neck and head and losing balance anteriorly, sat a total of 5 minutes.   Exercise  General Exercises - Lower Extremity Long Arc Quad: AROM;Both;5 reps;Seated End of Session PT - End of Session Equipment Utilized During Treatment: Gait belt Activity Tolerance: Patient limited by fatigue Patient left: in chair;with call bell in reach Nurse Communication: Mobility status for transfers;Need for lift equipment General Behavior During Session: [facility] for tasks performed Cognition:  Impaired Cognitive Impairment: Slow to process commands with delayed response time  of up to 2 minutes at times  Harrington,Emily Pauli 01/29/2012, 11:14 AM [facility] Acute Rehabilitation [phone] (pager)

## 2012-01-29 NOTE — Significant Event (Signed)
1630: panda clogged. Attempts to flush unsuccessful. Panda removed and inserted without complications. KUB obtained. Verbal order from MD Molli Knock @ 1830pm to use panda. Will monitor. Siriyah Ambrosius, Charity fundraiser.

## 2012-01-29 NOTE — Progress Notes (Addendum)
Nutrition Follow-up  Pt s/p bedside tracheostomy insertion 4/9. Plan is to push trach collar during day as tolerated. OGT out with trach procedure. Panda tube placed yesterday; pt pulled out this AM. RN attempting to reinsert upon RD visitation.   Diet Order:  NPO  Meds: Scheduled Meds:   . antiseptic oral rinse  15 mL Mouth Rinse QID  . aspirin EC  162 mg Oral Daily  . chlorhexidine  15 mL Mouth Rinse BID  . clonazePAM  1 mg Per Tube BID  . cloNIDine  0.1 mg Per Tube BID  . enoxaparin (LOVENOX) injection  40 mg Subcutaneous Q24H  . etomidate      . etomidate  40 mg Intravenous Once  . feeding supplement  30 mL Per Tube QID  . feeding supplement (PROMOTE)  1,000 mL Per Tube Q24H  . fentaNYL      . fentaNYL  100 mcg Intravenous Once  . fentaNYL  200 mcg Intravenous Once  . Gerhardt's butt cream   Topical BID  . insulin aspart  0-15 Units Subcutaneous Q4H  . midazolam      . midazolam  5 mg Intravenous Once  . nystatin   Topical TID  . pantoprazole sodium  40 mg Per Tube Q1200  . potassium chloride  40 mEq Oral Once  . sodium chloride  10-40 mL Intracatheter Q12H  . DISCONTD: propofol  5-70 mcg/kg/min Intravenous Once  . DISCONTD: QUEtiapine  25 mg Oral QHS  . DISCONTD: vecuronium  10 mg Intravenous Once   Continuous Infusions:   . sodium chloride 20 mL/hr at 01/29/12 0800  . dexmedetomidine (PRECEDEX) IV infusion for high rates Stopped (01/29/12 0730)  . midazolam (VERSED) infusion Stopped (01/29/12 0700)   PRN Meds:.acetaminophen (TYLENOL) oral liquid 160 mg/5 mL, albuterol, fentaNYL, ipratropium, sodium chloride, DISCONTD: fentaNYL  Labs:  CMP     Component Value Date/Time   NA 146* 01/29/2012 0432   K 3.4* 01/29/2012 0432   CL 111 01/29/2012 0432   CO2 29 01/29/2012 0432   GLUCOSE 127* 01/29/2012 0432   BUN 24* 01/29/2012 0432   CREATININE 1.08 01/29/2012 0432   CALCIUM 9.5 01/29/2012 0432   PROT 5.9* 01/17/2012 0425   ALBUMIN 2.1* 01/17/2012 0425   AST 30 01/17/2012 0425    ALT 42* 01/17/2012 0425   ALKPHOS 158* 01/17/2012 0425   BILITOT 0.2* 01/17/2012 0425   GFRNONAA 54* 01/29/2012 0432   GFRAA 62* 01/29/2012 0432     Intake/Output Summary (Last 24 hours) at 01/29/12 1111 Last data filed at 01/29/12 0800  Gross per 24 hour  Intake 1504.48 ml  Output   2735 ml  Net -1230.52 ml    CBG (last 3)   Basename 01/29/12 0806 01/29/12 0406 01/28/12 2329  GLUCAP 80 121* 111*    Weight Status:  84.2 kg (4/10) -- trending down  Re-estimated needs:  1500-1600 kcas, 105-115 gm protein  Nutrition Dx:  Inadequate Oral Intake r/t inability to eat as evidenced by NPO status.  Status: Ongoing.  Goal:  EN to meet >90% of estimated nutrition needs, currently unmet Monitor: EN tolerance, respiratory status, weight, labs, I/O's  Intervention:    Once Panda able to use, resume Promote formula at 15 ml/hr and increase by 10 ml every 4 hours to goal rate of 55 ml/hr with Prostat liquid protein 30 ml BID via tube to provide 1520 total kcals, 113 gm protein, 1107 ml of free water  RD to follow for nutrition care plan  Alger Memos Pager #:  [phone]

## 2012-01-29 NOTE — Progress Notes (Signed)
SLP Cancellation Note  Treatment cancelled today due to medical issues with patient which prohibited therapy.  Patient remains on vent. Will f/u for weaning trials to trach collar for ability to complete PMSV evaluation. Given diagnosis of tracheomalacia, prognosis guarded for ability to safely tolerate PMSV. Will f/u.  Ferdinand Lango MA, CCC-SLP [phone]   Katlynne Mckercher Meryl 01/29/2012, 9:06 AM

## 2012-01-29 NOTE — Progress Notes (Signed)
Emily Harrington is a 61 y.o. female admitted on 01/08/2012 with Pneumococcal bacteremia, PEA arrest.  Transitioned to SDU 3/23.  Developed increasing dyspnea, and change in mental status and transfer back to ICU 3/30.  Line/tube: (Truncated 4/8) ETT 3/30>>4/4 ETT 4/4>>4/9 Lt IJ CVL 3/30>>4/8 Rt PICC 4/8>> Trach (DF) 4/9>>  Cultures: (Truncated 4/8) Blood 3/20>>Pneumococcus Sputum 3/20>>E coli BAL 4/1>>Candida C diff 4/2>>negative  Antibiotics: (Truncated 4/8) Zosyn 3/30>>4/8 Vancomycin 3/30>>4/3  Tests/events: 3/20 Echo>>EF 55 to 60%, mild LVH 3/20 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 3/20 CT abd/pelvis>>Large Rt renal pelvis stone, avascular necrosis Lt femoral head 3/22 EEG>>non-specific slow rhythm, no epileptiform activity 3/27 Renal u/s>>Large Rt renal pelvis stone w/o hydronephrosis 3/29 CT chest>>decrease RUL ASD, patchy LUL ASD/ATX, b/l effusions, multiple rib fx's and probable sternal fx 3/29 Doppler legs>>no DVT in either Rt or Lt legs 4/1 Bronchoscopy>>atypical cells in cytology 4/4 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  Tolerating pressure support.  OBJECTIVE:  Blood pressure 144/50, pulse 81, temperature 99.1 F (37.3 C), temperature source Oral, resp. rate 19, height 5\' 5"  (1.651 m), weight 185 lb 10 oz (84.2 kg), SpO2 98.00%. Wt Readings from Last 3 Encounters:  01/29/12 185 lb 10 oz (84.2 kg)   Body mass index is 30.89 kg/(m^2).  I/O last 3 completed shifts: In: 2315.8 [I.V.:1355.8; NG/GT:960] Out: 4400 [Urine:4100; Stool:300]  Vent Mode:  [-] PSV;CPAP FiO2 (%):  [29.8 %-30.3 %] 30 % Set Rate:  [14 bmp] 14 bmp Vt Set:  [450 mL] 450 mL PEEP:  [5 cmH20-5.2 cmH20] 5 cmH20 Pressure Support:  [10 cmH20] 10 cmH20 Plateau Pressure:  [0 cmH20-23 cmH20] 0 cmH20  Physical Exam: General - no distress HEENT - Trach site clean Cardiac - s1s2 regular, no murmur Chest - scattered rhonchi, no wheeze Abd - soft, nontender, + bowel  sounds GU - foley in place Ext - no edema Neuro - follows commands  CBC    Component Value Date/Time   WBC 7.4 01/29/2012 0432   RBC 2.95* 01/29/2012 0432   HGB 8.2* 01/29/2012 0432   HCT 25.3* 01/29/2012 0432   PLT 325 01/29/2012 0432   MCV 85.8 01/29/2012 0432   MCH 27.8 01/29/2012 0432   MCHC 32.4 01/29/2012 0432   RDW 15.4 01/29/2012 0432   LYMPHSABS 1.0 01/11/2012 0340   MONOABS 0.7 01/11/2012 0340   EOSABS 0.0 01/11/2012 0340   BASOSABS 0.0 01/11/2012 0340    BMET    Component Value Date/Time   NA 146* 01/29/2012 0432   K 3.4* 01/29/2012 0432   CL 111 01/29/2012 0432   CO2 29 01/29/2012 0432   GLUCOSE 127* 01/29/2012 0432   BUN 24* 01/29/2012 0432   CREATININE 1.08 01/29/2012 0432   CALCIUM 9.5 01/29/2012 0432   GFRNONAA 54* 01/29/2012 0432   GFRAA 62* 01/29/2012 0432    Chest Portable 1 View To Assess Tube Placement And Rule-out Pneumothorax  01/28/2012  *RADIOLOGY REPORT*  Clinical Data: Tracheostomy placement  PORTABLE CHEST - 1 VIEW  Comparison: Earlier film of the same day  Findings: The patient has been extubated and tracheostomy tube placed,   projecting in expected location.  The nasogastric tube has been removed.  Right PICC is stable.  Airspace disease in the right upper lobe persists.  There is patchy atelectasis at the left lung base as before.  No definite effusion.  Heart size normal.  No pneumothorax or pneumomediastinum.  IMPRESSION:  1.  Tracheostomy placement without pneumothorax or other apparent complication. 2.  Persistent right upper  lobe airspace consolidation.  Original Report Authenticated By: Osa Craver, M.D.   Dg Chest Port 1 View  01/28/2012  *RADIOLOGY REPORT*  Clinical Data: Pneumonia  PORTABLE CHEST - 1 VIEW  Comparison: January 27, 2012  Findings: The ET tube tip remains in good position.  The left IJ central line has been removed.  There is a right upper extremity PICC line with the tip at the cavoatrial junction.  No pneumothorax.  The orogastric tube  tip courses off the inferior film.  Consolidated infiltrate in the right upper lobe does not appear changed.  The left lung is clear.  The cardiac silhouette, mediastinum, pulmonary vasculature are within normal limits.  IMPRESSION: No change in right upper lobe consolidated infiltrate.  Right upper extremity PICC line tip in good position.  No pneumothorax.  Original Report Authenticated By: Brandon Melnick, M.D.   Dg Abd Portable 1v  01/29/2012  *RADIOLOGY REPORT*  Clinical Data: Evaluate feeding tube placement.  PORTABLE ABDOMEN - 1 VIEW  Comparison: Abdominal radiograph 01/28/2012.  Findings: The abdomen is incompletely visualized.  A metallic tipped feeding tube is present coiled within the stomach.  Surgical clips are seen projecting over the right upper quadrant of the abdomen.  Multiple gas filled loops of bowel are noted.  There is a mass-like pleural-based opacity in the periphery of the right upper lobe which is similar to prior chest radiographs, likely represent persistent pneumonia.  IMPRESSION: 1.  Tip of feeding tube is within the body of the stomach. 2.  Persistent right upper lobe airspace consolidation likely represent resolving pneumonia.  Original Report Authenticated By: Florencia Reasons, M.D.   Dg Abd Portable 1v  01/28/2012  *RADIOLOGY REPORT*  Clinical Data: Panda tube placement  PORTABLE ABDOMEN - 1 VIEW  Comparison: None.  Findings: The feeding tube tip is at the level of the gastric antrum/pylorus.  The visualized bowel gas pattern is normal.  The lung bases are clear.  An oval calcification overlies the right renal region, as seen on recent ultrasound.  IMPRESSION: Feeding tube tip at level of the antrum/pylorus.  Original Report Authenticated By: Brandon Melnick, M.D.    ASSESSMENT/PLAN:  Acute respiratory failure initially from Pneumococcal PNA/bacteremia, and E coli PNA.  Multiple rib fx after CPR.  Now with tracheomalacia limiting ability to proceed with extubation.  Has hx  of asthma. -push to trach collar during day as tolerated>>rest on vent overnight for now -f/u CXR intermittently -prn bronchodilators -f/u broncho BAL cytology from 4/9 -completed course of antibiotics -speech therapy to assess for speech valve and swallow  Acute renal failure >> resolved  Protein calorie malnutrition -continue tube feeds pending eval by speech therapy  Agitated delirium -continue schedule klonopin -wean off versed and precedex gtt as tolerated now that she is trached -d/c seroquel  HTN -continue catapres  Hypokalemia -replace and f/u BMET  Hyperglycemia -SSI  Candidal dermatitis -nystatin powder  Anemia of chronic disease and critical illness -f/u CBC intermittently -transfuse for Hb < 7  Best practice -lovenox for DVT proph -protonix for SUP  Disposition -OOB -continue with PT -keep foley in for now -will likely be able to transfer out of ICU in next 1 or 2 days, depending on her ability to tolerate trach collar  Maizee Reinhold Pager:  [phone] 01/29/2012, 7:46 AM

## 2012-01-29 NOTE — Significant Event (Signed)
Pt noted to be agitated with increased HR and BP.  Will give ativan x one and monitor.  Coralyn Helling, MD 01/29/2012, 1:41 PM Pager:  [phone]

## 2012-01-30 DIAGNOSIS — J398 Other specified diseases of upper respiratory tract: Secondary | ICD-10-CM

## 2012-01-30 DIAGNOSIS — J13 Pneumonia due to Streptococcus pneumoniae: Secondary | ICD-10-CM

## 2012-01-30 DIAGNOSIS — J988 Other specified respiratory disorders: Secondary | ICD-10-CM

## 2012-01-30 DIAGNOSIS — Z9911 Dependence on respirator [ventilator] status: Secondary | ICD-10-CM

## 2012-01-30 DIAGNOSIS — J9600 Acute respiratory failure, unspecified whether with hypoxia or hypercapnia: Secondary | ICD-10-CM

## 2012-01-30 LAB — GLUCOSE, CAPILLARY
Glucose-Capillary: 102 mg/dL — ABNORMAL HIGH (ref 70–99)
Glucose-Capillary: 131 mg/dL — ABNORMAL HIGH (ref 70–99)
Glucose-Capillary: 147 mg/dL — ABNORMAL HIGH (ref 70–99)
Glucose-Capillary: 86 mg/dL (ref 70–99)

## 2012-01-30 LAB — BASIC METABOLIC PANEL
BUN: 14 mg/dL (ref 6–23)
CO2: 27 mEq/L (ref 19–32)
Calcium: 9.3 mg/dL (ref 8.4–10.5)
Glucose, Bld: 150 mg/dL — ABNORMAL HIGH (ref 70–99)
Potassium: 3.3 mEq/L — ABNORMAL LOW (ref 3.5–5.1)
Sodium: 147 mEq/L — ABNORMAL HIGH (ref 135–145)

## 2012-01-30 MED ORDER — POTASSIUM CHLORIDE 20 MEQ/15ML (10%) PO LIQD
40.0000 meq | ORAL | Status: AC
  Administered 2012-01-30 (×2): 40 meq
  Filled 2012-01-30 (×2): qty 30

## 2012-01-30 NOTE — Significant Event (Signed)
1600pm; noted pt with elevated HR in the 130s, BP 170/69, pt is restless, continue to slough down to bottom and sides of bed. Notified E-Link per order of MD Sood. Received verbal order from MD Molli Knock to restart versed. Pt is being monitored closely. Acheron Sugg, Charity fundraiser.

## 2012-01-30 NOTE — Progress Notes (Signed)
Per dtr's request, Clinical Pharmacist, community of bed offers.  At this time, pt has no bed offers.  There are four (4) facilities that are "considering" pt.  CSW to continue to follow and assist as needed.   Angelia Mould, MSW, [place] [phone]

## 2012-01-30 NOTE — Procedures (Signed)
Objective Swallowing Evaluation: Fiberoptic Endoscopic Evaluation of Swallowing  Patient Details  Name: Emily Harrington MRN: [id number] Date of Birth: 10-12-1951  Today's Date: 01/30/2012    Past Medical History:  Past Medical History  Diagnosis Date  . Asthma    Past Surgical History:  Past Surgical History  Procedure Date  . Cholecystectomy   . Abdominal hysterectomy    HPI:  61 year old female presented 3/20 with strep bacteremia ,PEA arrest requiring CPR was intubated enroute to the ED. Extubated 3/23 and moved to SDU, PCCM signed off 3/27. Cont to have difficulty with SOB and pain management and 3/30 developed worsening lethargy and resp distress and reintubated for hypercarbic resp failure ? Narcotic induced. Pt was with worsening right upper lobe pna vs. Mass s/p extubation 4/4. 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 A tracheostomy was performed 4/9.     Recommendation/Prognosis  Clinical impression: Patient presents with a mild oral and moderate pharyngeal phase dysphagia characterized primarily by delayed swallow initiation which results in silent aspiration of thin and nectar thick liquids. Additionally, mild weakness results in pharyngeal residuals which are only mild in nature however patient without great awareness resulting in aspiration post swallow with thin liquids. Patient was able to consume pureed solids and honey thcik liquids without indication of aspiration as swallow triggered primarily at the level of the vallecula as long as liquids provided via small controlled tsp size. PMSV periodically in place for exam, removed intermittently given mild increased WOB however suspect this was due as a result of AMS and restlessness as vitals all remained WFL with valve in place. Swallowing function did not appear changed based on if patient with or without valve in place. At this time,  recommend initiation of conservative diet, puree with honey thick liquids via tsp. Based on severity of dysphagia and AMS, aspiration risk remains moderate-high. Will f/u closely.   Swallow Evaluation Recommendations Diet Recommendations: Dysphagia 1 (Puree);Honey-thick liquid Liquid Administration via: Spoon (teaspoon only, no cup or straw sips) Medication Administration: Crushed with puree Supervision: Patient able to self feed;Full supervision/cueing for compensatory strategies Compensations: Slow rate;Small sips/bites (tsp only for liquids) Postural Changes and/or Swallow Maneuvers: Seated upright 90 degrees;Upright 30-60 min after meal Oral Care Recommendations: Oral care BID Other Recommendations: Order thickener from pharmacy;Prohibited food (jello, ice cream, thin soups);Remove water pitcher Follow up Recommendations: Other (comment) (to be determined)  SLP Goals  SLP Swallowing Goals Patient will consume recommended diet without observed clinical signs of aspiration with: Moderate assistance Swallow Study Goal #1 - Progress: Not Met Patient will utilize recommended strategies during swallow to increase swallowing safety with: Moderate assistance Swallow Study Goal #2 - Progress: Not met   Shaquaya Wuellner Meryl 01/30/2012, 3:35 PM

## 2012-01-30 NOTE — Progress Notes (Signed)
Emily Harrington is a 61 y.o. female admitted on 01/08/2012 with Pneumococcal bacteremia, PEA arrest.  Transitioned to SDU 3/23.  Developed increasing dyspnea, and change in mental status and transfer back to ICU 3/30.  Line/tube: (Truncated 4/8) ETT 3/30>>4/4 ETT 4/4>>4/9 Lt IJ CVL 3/30>>4/8 Rt PICC 4/8>> Trach (DF) 4/9>>  Cultures: (Truncated 4/8) Blood 3/20>>Pneumococcus Sputum 3/20>>E coli BAL 4/1>>Candida C diff 4/2>>negative  Antibiotics: (Truncated 4/8) Zosyn 3/30>>4/8 Vancomycin 3/30>>4/3  Tests/events: 3/20 Echo>>EF 55 to 60%, mild LVH 3/20 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 3/20 CT abd/pelvis>>Large Rt renal pelvis stone, avascular necrosis Lt femoral head 3/22 EEG>>non-specific slow rhythm, no epileptiform activity 3/27 Renal u/s>>Large Rt renal pelvis stone w/o hydronephrosis 3/29 CT chest>>decrease RUL ASD, patchy LUL ASD/ATX, b/l effusions, multiple rib fx's and probable sternal fx 3/29 Doppler legs>>no DVT in either Rt or Lt legs 4/1 Bronchoscopy>>atypical cells in cytology from RUL 4/4 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  Placed on vent last night due to agitation.  Denies chest/abd pain this morning.  OBJECTIVE:  Blood pressure 118/41, pulse 75, temperature 99.4 F (37.4 C), temperature source Oral, resp. rate 22, height 5\' 5"  (1.651 m), weight 180 lb 8.9 oz (81.9 kg), SpO2 97.00%. Wt Readings from Last 3 Encounters:  01/30/12 180 lb 8.9 oz (81.9 kg)   Body mass index is 30.05 kg/(m^2).  I/O last 3 completed shifts: In: 2657.9 [I.V.:1182.9; Other:220; NG/GT:1255] Out: 5215 [Urine:4705; Stool:510]  Vent Mode:  [-] PRVC FiO2 (%):  [28 %-30.4 %] 30.1 % Set Rate:  [14 bmp] 14 bmp Vt Set:  [450 mL] 450 mL PEEP:  [5 cmH20] 5 cmH20 Plateau Pressure:  [14 cmH20-22 cmH20] 14 cmH20  Physical Exam: General - no distress HEENT - Trach site clean Cardiac - s1s2 regular, no  murmur Chest - scattered rhonchi, no wheeze Abd - soft, nontender, + bowel sounds GU - foley in place Ext - no edema Neuro - follows commands  CBC    Component Value Date/Time   WBC 7.4 01/29/2012 0432   RBC 2.95* 01/29/2012 0432   HGB 8.2* 01/29/2012 0432   HCT 25.3* 01/29/2012 0432   PLT 325 01/29/2012 0432   MCV 85.8 01/29/2012 0432   MCH 27.8 01/29/2012 0432   MCHC 32.4 01/29/2012 0432   RDW 15.4 01/29/2012 0432   LYMPHSABS 1.0 01/11/2012 0340   MONOABS 0.7 01/11/2012 0340   EOSABS 0.0 01/11/2012 0340   BASOSABS 0.0 01/11/2012 0340    BMET    Component Value Date/Time   NA 147* 01/30/2012 0500   K 3.3* 01/30/2012 0500   CL 112 01/30/2012 0500   CO2 27 01/30/2012 0500   GLUCOSE 150* 01/30/2012 0500   BUN 14 01/30/2012 0500   CREATININE 1.14* 01/30/2012 0500   CALCIUM 9.3 01/30/2012 0500   GFRNONAA 50* 01/30/2012 0500   GFRAA 58* 01/30/2012 0500    Dg Abd 1 View  01/29/2012  *RADIOLOGY REPORT*  Clinical Data: Evaluate feeding tube placement.  ABDOMEN - 1 VIEW  Comparison: The abdominal radiograph 01/29/2012 at 06:54 a.m.  Findings: Feeding tube has been advanced.  The tip of the tube is now likely within the proximal small bowel.  The visualized portions of the chest and abdomen otherwise appear unchanged.  IMPRESSION: 1.  Tip of feeding tube has been advanced and now likely within the proximal small bowel.  Original Report Authenticated By: Florencia Reasons, M.D.   Dg Chest Port 1 View  01/29/2012  *RADIOLOGY  REPORT*  Clinical Data: Follow up pneumonia  PORTABLE CHEST - 1 VIEW  Comparison: 01/28/2012  Findings: Cardiomediastinal silhouette is stable.  Stable tracheostomy tube position.  NG tube in place.  Stable right arm PICC line position.  Persistent right upper lobe peripheral infiltrate with slight improved aeration.  Left lung is clear.  No pulmonary edema.  IMPRESSION: Stable tracheostomy tube position.  NG tube in place.  Stable right arm PICC line position.  Persistent right upper  lobe peripheral infiltrate with slight improved aeration.  Left lung is clear.  No pulmonary edema.  Original Report Authenticated By: Natasha Mead, M.D.   Chest Portable 1 View To Assess Tube Placement And Rule-out Pneumothorax  01/28/2012  *RADIOLOGY REPORT*  Clinical Data: Tracheostomy placement  PORTABLE CHEST - 1 VIEW  Comparison: Earlier film of the same day  Findings: The patient has been extubated and tracheostomy tube placed,   projecting in expected location.  The nasogastric tube has been removed.  Right PICC is stable.  Airspace disease in the right upper lobe persists.  There is patchy atelectasis at the left lung base as before.  No definite effusion.  Heart size normal.  No pneumothorax or pneumomediastinum.  IMPRESSION:  1.  Tracheostomy placement without pneumothorax or other apparent complication. 2.  Persistent right upper lobe airspace consolidation.  Original Report Authenticated By: Osa Craver, M.D.   Dg Abd Portable 1v  01/29/2012  *RADIOLOGY REPORT*  Clinical Data: Panda tube placement.  PORTABLE ABDOMEN - 1 VIEW  Comparison: 09/29/2012.  Findings: Panda tube is curled in the region of the gastric fundus - body with the tip at the level of the gastric antrum.  Post cholecystectomy.  Prominent calcification right aspect of the upper abdomen unchanged may be related to renal stone.  Nonspecific bowel gas pattern.  IMPRESSION: Panda tube is curled in the region of the gastric fundus - body with the tip at the level of the gastric antrum.  Original Report Authenticated By: Fuller Canada, M.D.   Dg Abd Portable 1v  01/29/2012  *RADIOLOGY REPORT*  Clinical Data: Evaluate feeding tube placement.  PORTABLE ABDOMEN - 1 VIEW  Comparison: Abdominal radiograph 01/28/2012.  Findings: The abdomen is incompletely visualized.  A metallic tipped feeding tube is present coiled within the stomach.  Surgical clips are seen projecting over the right upper quadrant of the abdomen.  Multiple gas  filled loops of bowel are noted.  There is a mass-like pleural-based opacity in the periphery of the right upper lobe which is similar to prior chest radiographs, likely represent persistent pneumonia.  IMPRESSION: 1.  Tip of feeding tube is within the body of the stomach. 2.  Persistent right upper lobe airspace consolidation likely represent resolving pneumonia.  Original Report Authenticated By: Florencia Reasons, M.D.   Dg Abd Portable 1v  01/28/2012  *RADIOLOGY REPORT*  Clinical Data: Panda tube placement  PORTABLE ABDOMEN - 1 VIEW  Comparison: None.  Findings: The feeding tube tip is at the level of the gastric antrum/pylorus.  The visualized bowel gas pattern is normal.  The lung bases are clear.  An oval calcification overlies the right renal region, as seen on recent ultrasound.  IMPRESSION: Feeding tube tip at level of the antrum/pylorus.  Original Report Authenticated By: Brandon Melnick, M.D.    ASSESSMENT/PLAN:  Acute respiratory failure initially from Pneumococcal PNA/bacteremia, and E coli PNA.  Multiple rib fx after CPR.  Now with tracheomalacia limiting ability to proceed with extubation, thus  requiring trach.  Has hx of asthma. -push to trach collar during day as tolerated>>rest on vent overnight for now, but use pressure support -f/u CXR intermittently to document clearing of RUL infiltrate -prn bronchodilators -completed course of antibiotics -speech therapy following for speech valve and swallow>>should be okay to proceed even if she still has intermittent vent requirements at night  Acute renal failure >> resolved  Protein calorie malnutrition -continue tube feeds until swallow approved by speech therapy  Agitated delirium -continue schedule klonopin -wean off versed and precedex gtt as tolerated now that she is trached  HTN -continue catapres  Hypokalemia -replace and f/u BMET  Hyperglycemia -SSI  Candidal dermatitis -nystatin powder  Anemia of chronic  disease and critical illness -f/u CBC intermittently -transfuse for Hb < 7  Best practice -lovenox for DVT proph -protonix for SUP  Disposition -OOB -continue with PT -keep foley in for now -Transfer to 2600/2900 vent-SDU bed -Will ask case manager to assess for LTAC placement  Kyair Ditommaso Pager:  [phone] 01/30/2012, 7:57 AM

## 2012-01-30 NOTE — Progress Notes (Signed)
Speech Pathology  PMSV (Passy-Muir Speaking Valve) Evaluation  I. HPI:  61 year old female presented 3/20 with strep bacteremia ,PEA arrest requiring CPR was intubated enroute to the ED. Extubated 3/23 and moved to SDU, PCCM signed off 3/27. Cont to have difficulty with SOB and pain management and 3/30 developed worsening lethargy and resp distress and reintubated for hypercarbic resp failure ? Narcotic induced. Pt was with worsening right upper lobe pna vs. Mass s/p extubation 4/4. 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 A tracheostomy was performed 4/9.   II. Tracheostomy Tube  Initial trach Placement date: 01/28/12  Trach collar period: During daytime hours (remains on the vent at night) Type: Shiley    Size: 6     FiO2: 30.1 Cuff: yes Fenestration:  no Secretion description: mild, pink tinged, thin Frequency of tracheal suctioning: unknown Level of secretion expectoration: tracheal  II. Ventilator Dependency  yes Weaning Trials:  yes Vent Mode:        Nocturnal Vent: yes Vt:     Rate:  PEEP:    PS:   FiO2: 30  IV. Cuff Deflation Trials  yes for 38  minutes  V. PMSV Trial PMSV was placed for 30 minutes. Able to redirect subglottic air through upper airway: yes Able to attain phonation with PMSV: yes Able to expectorate secretions with PMSV: not observed Breath Support for phonation: decreased (few words per breath) Intelligibiity: decreased secondary to volume and breath support RR: 16  SpO2: 96-99% HR:106  Behavior: alert, cooperative and distractible  VI. Clinical Impression: Pt was seen by SLP to assess PMSV tolerance and readiness for a swallowing evaluation. Upon entering the room, the pt's cuff was inflated and her vital signs were [facility]. SLP deflated the cuff with minimal coughing and mild tracheal expectoration of secretions. Vital signs remained WFL after deflation and after SLP placed the  speaking valve. No evidence of CO2 trapping was observed throughout this trial. The patient achieved phonation with moderate cues from SLP to engage in conversation. Speech intelligibility, impacted by decreased intensity and length of phonation,  was reduced secondary to reduced breath support. With moderate verbal/visual cues, pt was able to increase her volume and consequently her intelligibility to 50% during basic conversation. At this time, the patient is safe to to wear PMSV during waking hours with full supervision from staff.  Given the patient's lengthy intubation and ventilator dependency, an objective swallowing assessment is recommended. Pt is appropriate for objective testing as she is alert and eager to consume POs, requesting ice chips and pretzels. Will proceed with FEES this afternoon to evaluate oropharyngeal function and determine the safest diet at this time.  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 Recommendations MD- Consider changing tracheostomy tube to a cuffless trach if medically appropriate 1. Patient to use PMSV during waking hours with FULL staff supervision 2. FEES 4/11 to assess swallow function prior to initiating POs  VIII. Treatment Plan  yes Frequency/Duration: 3x/wk for 2 weeks Short Term Goals 1. Pt will wear PMSV without evidence of CO2 trapping, change in vital signs, or apparent distress during waking hours with full supervision. 2. Pt will increase her intelligibility to 100% by increasing her volume and breath support with moderate multimodal cues from SLP.  Pain: no   Maxcine Ham, SLP Student

## 2012-01-30 NOTE — Op Note (Signed)
NAME:  ALVERIA, MCGLAUGHLIN                 ACCOUNT NO.:  MEDICAL RECORD NO.:  [id number]  LOCATION:                                 FACILITY:  PHYSICIAN:  Nelda Bucks, MD DATE OF BIRTH:  [birth year]  DATE OF PROCEDURE: DATE OF DISCHARGE:                              OPERATIVE REPORT   PROCEDURE:  Percutaneous tracheostomy.  Consent for the procedure is obtained from the daughter.  Fully aware of risks and benefits of the procedure including pneumothorax, infection, bleeding, and death.  BRONCHOSCOPIST FOR THE PROCEDURE:  Coralyn Helling, MD  The Blood loss for the procedure was 8 mL.  PREOPERATIVE DIAGNOSIS:  Recurrent acute respiratory failure with tracheomalacia.  POSTOPERATIVE DIAGNOSE:  Status post tracheostomy secondary to multifactorial respiratory failure including pneumonia.  The patient was laid in supine position.  Chlorhexidine preparation was used to sterilize the operative site over the second endotracheal space. The bronchoscopist placed a bronchoscope through the endotracheal tube to back up to approximately 17 cm, 6 mL of lidocaine plus epinephrine was injected into the operative site.  A 1-cm vertical incision was made and dissection was made through significant fat pad.  I did identify strap muscles which were dissected in between and then was able to palpate tracheostomy rings clearly.  I then placed an 18-gauge needle over white catheter sheath into the airway which was noted by the bronchoscopist without any posterior wall injury or laceration.  The needle was removed.  The white catheter sheath remained.  The wire was placed through the white catheter sheath and went down the right mainstem, and was not advanced after some resistant was met.  The white catheter sheath was removed.  A 14-French punch dilator was placed over the wire successfully then removed.  A Band-Aid progressive Rhino dilator over a glider to 30-French was used and then removed.  I  then placed a size 6 Shiley cuffed tracheostomy over a 26-French dilator over the wire successfully into the airway.  Everything was removed except the tracheostomy.  The bronchoscopist placed the bronchoscope through the new tracheostomy and noted the carina approximately 4-cm below without any posterior wall injury or active bleeding.  The patient required Versed 6 mg, fentanyl 300 mcg, and etomidate 15 mg x2, did not require any paralysis.  The patient did not have any desaturations and had good hemodynamics throughout the procedure.  Blood loss is noted above.  Portable chest x-ray is pending at this time.     Nelda Bucks, MD     DJF/MEDQ  D:  01/28/2012  T:  01/28/2012  Job:  [id number]  cc:   [facility]

## 2012-01-31 ENCOUNTER — Inpatient Hospital Stay (HOSPITAL_COMMUNITY): Payer: Medicaid Other

## 2012-01-31 LAB — GLUCOSE, CAPILLARY
Glucose-Capillary: 123 mg/dL — ABNORMAL HIGH (ref 70–99)
Glucose-Capillary: 131 mg/dL — ABNORMAL HIGH (ref 70–99)
Glucose-Capillary: 132 mg/dL — ABNORMAL HIGH (ref 70–99)
Glucose-Capillary: 136 mg/dL — ABNORMAL HIGH (ref 70–99)
Glucose-Capillary: 136 mg/dL — ABNORMAL HIGH (ref 70–99)
Glucose-Capillary: 92 mg/dL (ref 70–99)

## 2012-01-31 LAB — CBC
HCT: 27 % — ABNORMAL LOW (ref 36.0–46.0)
Hemoglobin: 8.6 g/dL — ABNORMAL LOW (ref 12.0–15.0)
MCHC: 31.9 g/dL (ref 30.0–36.0)
RBC: 3.1 MIL/uL — ABNORMAL LOW (ref 3.87–5.11)

## 2012-01-31 LAB — BASIC METABOLIC PANEL
BUN: 14 mg/dL (ref 6–23)
Chloride: 113 mEq/L — ABNORMAL HIGH (ref 96–112)
GFR calc Af Amer: 60 mL/min — ABNORMAL LOW (ref >90)
Potassium: 3.5 mEq/L (ref 3.5–5.1)
Sodium: 148 mEq/L — ABNORMAL HIGH (ref 135–145)

## 2012-01-31 MED ORDER — METOPROLOL TARTRATE 25 MG/10 ML ORAL SUSPENSION
12.5000 mg | Freq: Two times a day (BID) | ORAL | Status: DC
  Administered 2012-01-31: 12.5 mg
  Filled 2012-01-31 (×2): qty 5

## 2012-01-31 MED ORDER — POTASSIUM CHLORIDE 20 MEQ/15ML (10%) PO LIQD
40.0000 meq | Freq: Two times a day (BID) | ORAL | Status: AC
  Administered 2012-01-31 (×2): 40 meq
  Filled 2012-01-31 (×2): qty 30

## 2012-01-31 MED ORDER — LORAZEPAM 2 MG/ML IJ SOLN
1.0000 mg | INTRAMUSCULAR | Status: DC | PRN
  Administered 2012-01-31: 1 mg via INTRAVENOUS
  Filled 2012-01-31: qty 1

## 2012-01-31 MED ORDER — METOPROLOL TARTRATE 1 MG/ML IV SOLN
5.0000 mg | INTRAVENOUS | Status: DC
  Administered 2012-01-31 – 2012-02-01 (×4): 5 mg via INTRAVENOUS
  Filled 2012-01-31 (×10): qty 5

## 2012-01-31 NOTE — Progress Notes (Signed)
Placed pt. On CPAP/PS, 10/5, 30% per MD order due to HR in the upper 120's & RR in the upper 30's. RT will continue to monitor.

## 2012-01-31 NOTE — Plan of Care (Signed)
Problem: Phase II Progression Outcomes Goal: Date pt extubated/weaned off vent Outcome: Completed/Met Date Met:  01/31/12 01-31-12 Goal: Time pt extubated/weaned off vent Outcome: Completed/Met Date Met:  01/31/12 0915

## 2012-01-31 NOTE — Progress Notes (Signed)
Emily Harrington is a 61 y.o. female admitted on 01/08/2012 with Pneumococcal bacteremia, PEA arrest.  Transitioned to SDU 3/23.  Developed increasing dyspnea, and change in mental status and transfer back to ICU 3/30.  Line/tube: (Truncated 4/8) ETT 3/30>>4/4 ETT 4/4>>4/9 Lt IJ CVL 3/30>>4/8 Rt PICC 4/8>> Trach (DF) 4/9>>  Cultures: (Truncated 4/8) Blood 3/20>>Pneumococcus Sputum 3/20>>E coli BAL 4/1>>Candida C diff 4/2>>negative  Antibiotics: (Truncated 4/8) Zosyn 3/30>>4/8 Vancomycin 3/30>>4/3  Tests/events: 3/20 Echo>>EF 55 to 60%, mild LVH 3/20 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 3/20 CT abd/pelvis>>Large Rt renal pelvis stone, avascular necrosis Lt femoral head 3/22 EEG>>non-specific slow rhythm, no epileptiform activity 3/27 Renal u/s>>Large Rt renal pelvis stone w/o hydronephrosis 3/29 CT chest>>decrease RUL ASD, patchy LUL ASD/ATX, b/l effusions, multiple rib fx's and probable sternal fx 3/29 Doppler legs>>no DVT in either Rt or Lt legs 4/1 Bronchoscopy>>atypical cells in cytology from RUL 4/4 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  Still needs nocturnal CPAP.    OBJECTIVE:  Blood pressure 155/62, pulse 115, temperature 97.9 F (36.6 C), temperature source Oral, resp. rate 25, height 5\' 5"  (1.651 m), weight 178 lb 12.7 oz (81.1 kg), SpO2 99.00%. Wt Readings from Last 3 Encounters:  01/31/12 178 lb 12.7 oz (81.1 kg)   Body mass index is 29.75 kg/(m^2).  I/O last 3 completed shifts: In: 3022.5 [I.V.:1212.5; Other:180; NG/GT:1630] Out: 4500 [Urine:4150; Stool:350]  Vent Mode:  [-] Stand-by FiO2 (%):  [28 %-30.2 %] 28 % PEEP:  [5 cmH20-5.2 cmH20] 5.2 cmH20 Pressure Support:  [10 cmH20] 10 cmH20  Physical Exam: General - no distress HEENT - Trach site clean Cardiac - s1s2 regular, no murmur Chest - scattered rhonchi, no wheeze Abd - soft, nontender, + bowel sounds GU - foley in  place Ext - no edema Neuro - follows commands  CBC    Component Value Date/Time   WBC 10.4 01/31/2012 0500   RBC 3.10* 01/31/2012 0500   HGB 8.6* 01/31/2012 0500   HCT 27.0* 01/31/2012 0500   PLT 331 01/31/2012 0500   MCV 87.1 01/31/2012 0500   MCH 27.7 01/31/2012 0500   MCHC 31.9 01/31/2012 0500   RDW 16.0* 01/31/2012 0500   LYMPHSABS 1.0 01/11/2012 0340   MONOABS 0.7 01/11/2012 0340   EOSABS 0.0 01/11/2012 0340   BASOSABS 0.0 01/11/2012 0340    BMET    Component Value Date/Time   NA 148* 01/31/2012 0500   K 3.5 01/31/2012 0500   CL 113* 01/31/2012 0500   CO2 28 01/31/2012 0500   GLUCOSE 134* 01/31/2012 0500   BUN 14 01/31/2012 0500   CREATININE 1.12* 01/31/2012 0500   CALCIUM 9.4 01/31/2012 0500   GFRNONAA 52* 01/31/2012 0500   GFRAA 60* 01/31/2012 0500    Dg Abd 1 View  01/29/2012  *RADIOLOGY REPORT*  Clinical Data: Evaluate feeding tube placement.  ABDOMEN - 1 VIEW  Comparison: The abdominal radiograph 01/29/2012 at 06:54 a.m.  Findings: Feeding tube has been advanced.  The tip of the tube is now likely within the proximal small bowel.  The visualized portions of the chest and abdomen otherwise appear unchanged.  IMPRESSION: 1.  Tip of feeding tube has been advanced and now likely within the proximal small bowel.  Original Report Authenticated By: Florencia Reasons, M.D.   Dg Chest Port 1 View  01/31/2012  *RADIOLOGY REPORT*  Clinical Data: 61 year old female with pneumonia, sepsis, respiratory failure, cardiac arrest.  PORTABLE CHEST - 1 VIEW  Comparison: 01/29/2012  and earlier.  Findings: AP portable semi upright view 0551 hours.  Stable tracheostomy tube.  Enteric feeding tube now in place, tip not included.  Stable right PICC line.  Stable lung volumes.  Continued peripheral right upper lobe consolidation.  Stable ventilation.  No pneumothorax, pulmonary edema or effusion.  IMPRESSION: 1. Stable lines and tubes. 2.  Stable right upper lobe consolidation.  No new cardiopulmonary  abnormality.  Original Report Authenticated By: Harley Hallmark, M.D.   Dg Abd Portable 1v  01/29/2012  *RADIOLOGY REPORT*  Clinical Data: Panda tube placement.  PORTABLE ABDOMEN - 1 VIEW  Comparison: 09/29/2012.  Findings: Panda tube is curled in the region of the gastric fundus - body with the tip at the level of the gastric antrum.  Post cholecystectomy.  Prominent calcification right aspect of the upper abdomen unchanged may be related to renal stone.  Nonspecific bowel gas pattern.  IMPRESSION: Panda tube is curled in the region of the gastric fundus - body with the tip at the level of the gastric antrum.  Original Report Authenticated By: Fuller Canada, M.D.    ASSESSMENT/PLAN:  Acute respiratory failure initially from Pneumococcal PNA/bacteremia, and E coli PNA.  Multiple rib fx after CPR.  Now with tracheomalacia limiting ability to proceed with extubation, thus requiring trach.  Has hx of asthma. -push to trach collar during day as tolerated>>see if she can go full 24 hrs off vent -f/u CXR intermittently to document clearing of RUL infiltrate -prn bronchodilators -completed course of antibiotics -fitted wht PM valve 4/11  Dysphagia -Swallow eval done 4/11>>Dysphagia 1 puree with honey thick liquid -speech therapy following  Acute renal failure >> resolved  Protein calorie malnutrition -continue tube feeds until able to take adequate oral feedings  Agitated delirium -continue schedule klonopin -d/c versed and precedex -prn ativan, fentanly  HTN, tachycardia -continue catapres -add low dose metoprolol  Hypokalemia -replace and f/u BMET  Hyperglycemia -SSI  Candidal dermatitis -nystatin powder  Anemia of chronic disease and critical illness -f/u CBC intermittently -transfuse for Hb < 7  Best practice -lovenox for DVT proph -protonix for SUP  Disposition -OOB -continue with PT -keep foley, rectal tube in for now -Transfer to 2600/2900 vent-SDU bed -Will  ask case manager to assess for LTAC placement>>insurance may be an issue  Emily Harrington Pager:  [phone] 01/31/2012, 9:23 AM

## 2012-01-31 NOTE — Progress Notes (Signed)
eLink Physician-Brief Progress Note Patient Name: CASSARA NIDA DOB: 05/05/1951 MRN: [id number]  Date of Service  01/31/2012   HPI/Events of Note  Failed low dose per tube beta blocker with sinus tachy and HTN   eICU Interventions  See orders for IV Beta blockade   Intervention Category Major Interventions: Hypertension - evaluation and management  Shan Levans 01/31/2012, 7:20 PM

## 2012-01-31 NOTE — Progress Notes (Signed)
Speech Pathology  PMSV (Passy-Muir Speaking Valve) Treatment Note  I. Tracheostomy Tube  Trach collar period: During daytime hours (ventilator at night) Type: Shiley    Size: 6     FiO2: 28% FiO2 Cuff: yes Fenestration:  no Secretion description: mild, thin, white Frequency of tracheal suctioning: unknown Level of secretion expectoration: tracheal  II. Ventilator Dependency  yes Weaning Trials:  yes Vent Mode: CPAP; PSV  Nocturnal Vent: yes PEEP: 5 cmH2O   PS: 10 cmH2O FiO2: 30%  III. Cuff Deflation Trials  Yes: cuff remains deflated  IV. PMSV Trial PMSV was placed for 5 minutes. Able to redirect subglottic air through upper airway: yes Able to attain phonation with PMSV: yes (minimal) Able to expectorate secretions with PMSV: not observed Breath Support for phonation: reduced Intelligibiity: reduced to ~50% RR: WFL SpO2: WFL HR: WFL Behavior: decreased sustained attention, requires cueing  V. Clinical Impression:  Pt was seen by SLP for f/u to assess PMSV tolerance. Overall, pt with decreased tolerance of the valve compared to initial evaluation 4/11. Pt with increased SOB today at baseline. SLP placed PMSV on trach hub for ~5 minutes with no change in vital signs, however CO2 trapping was noted. Pt had increased WOB and reported that she was feeling SOB. PMSV was removed secondary to decreased tolerance. Prior to removal, soft phonation was achieved with max verbal cues from SLP to initiate phonation. Breath support was reduced to only a few words per breath, impacting intensity and intelligibility. At this time, recommend that patient still continues to wear the valve as tolerated with close, full supervision from staff (RN educated by SLP regarding signs of intolerance) so the patient can continue to utilize her muscles of respiration and her upper airway. If patient tolerates, PMSV should also be worn during all PO intake.  VII. Recommendations MD- Consider changing tracheostomy  tube to a cuffless trach when appropriate 1. Patient to use PMSV as tolerated with close, full supervision from staff 2. Continue current diet, wearing PMSV as tolerated during PO intake 3. SLP to f/u closely to assess PMSV and diet tolerance  VIII. Treatment Plan  yes Frequency/Duration: 3x/wk for 2 weeks Short Term Goals 1. Pt will wear PMSV without evidence of CO2 trapping, change in vital signs, or apparent distress during waking hours with full supervision. Progressing. 2. Pt will increase her intelligibility to 100% by increasing her volume and breath support with moderate multimodal cues from SLP. Progressing.  Pain: no  Maxcine Ham, SLP Student

## 2012-01-31 NOTE — Progress Notes (Signed)
eLink Physician-Brief Progress Note Patient Name: Emily Harrington DOB: [birth year] MRN: [id number]  Date of Service  01/31/2012   HPI/Events of Note   Pt failed ATC. PT failed PSV for rest mode  eICU Interventions  PRVC for rest mode    Intervention Category Major Interventions: Respiratory failure - evaluation and management  Shan Levans 01/31/2012, 6:11 PM

## 2012-01-31 NOTE — Progress Notes (Signed)
Physical Therapy Treatment Patient Details Name: Emily Harrington MRN: [id number] DOB: November 14, 1950 Today's Date: 01/31/2012  PT Assessment/Plan  PT - Assessment/Plan Comments on Treatment Session: Patient admitted with respiratory issues.  On trach collar today.  Tolerated sitting but refused to ambulate secondary to anxiety.   PT Plan: Discharge plan remains appropriate;Frequency remains appropriate PT Frequency: Min 3X/week Follow Up Recommendations: Skilled nursing facility;Supervision/Assistance - 24 hour Equipment Recommended: Defer to next venue PT Goals  Acute Rehab PT Goals PT Goal: Supine/Side to Sit - Progress: Progressing toward goal PT Goal: Sit at [facility] Of Bed - Progress: Progressing toward goal  PT Treatment Precautions/Restrictions  Precautions Precautions: Fall Precaution Comments: Sternum very sore secondary to chest compressions  Required Braces or Orthoses DO NOT USE: No Restrictions Weight Bearing Restrictions: No Mobility (including Balance) Bed Mobility Supine to Sit: 1: +2 Total assist;Patient percentage (comment);HOB elevated (Comment degrees) (30 degrees; patient =40%) Supine to Sit Details (indicate cue type and reason): cues needed to faciitate to EOB and to maintain once at EOB secondary to waivering posture Sit to Supine: 3: Mod assist Sit to Supine - Details (indicate cue type and reason): Patient became anxious and layed down suddenly to her left upside down in the bed, took incr time to get patient to calm down and assist her back in bed appropriately.  Assisted nurse in changing pad under patient as it was soiled.  Nursing aware.   Transfers Sit to Stand: Not tested (comment) (Patient refused and became anxious.  See above) Stand to Sit: Not tested (comment) Stand Pivot Transfers: Not tested (comment) Squat Pivot Transfers: Not tested (comment) Ambulation/Gait Ambulation/Gait Assistance: Not tested (comment) Stairs: No Wheelchair  Mobility Wheelchair Mobility: No  Posture/Postural Control Posture/Postural Control: Postural limitations Postural Limitations: Patient had slightly better head and neck control today.  Was able to hold head semi-upright without cues or assist.  Patient continues to maintain kyphotic thoracic spine.   Static Sitting Balance Static Sitting - Balance Support: Bilateral upper extremity supported;Feet supported Static Sitting - Level of Assistance: 3: Mod assist Static Sitting - Comment/# of Minutes: Patient so anxious needs mod assist at times for safety.  Patient will move abruptly and need to watch lines and tubes.     End of Session PT - End of Session Activity Tolerance: Patient limited by fatigue;Other (comment) (Patient became too anxious to work with PT) Patient left: in bed;with call bell in reach;with family/visitor present Nurse Communication: Need for lift equipment General Behavior During Session: [facility] for tasks performed Cognition: Impaired  INGOLD,Danine Hor 01/31/2012, 2:16 PM  [facility] [phone] (pager)

## 2012-01-31 NOTE — Progress Notes (Signed)
Speech Language Pathology Dysphagia Treatment  Patient Details Name: Emily Harrington MRN: [id number] DOB: [birth year] Today's Date: 01/31/2012  SLP Assessment/Plan/Recommendation Assessment / Recommendations / Plan Clinical Impression Statement: Pt was seen by SLP for skilled observation of diet tolerance. Pt with decreased level of alertness and sustained attention today, limiting PO trials. POs were consumed without the PMSV in place due to decreased tolerance today. An intermittent, delayed cough was noted. During FEES 4/11 no aspiration occurred with pureed solids or honey thick liquids via tsp, however ? possible aspiration given decreased cognitive-linguistic status today. Despite questionable aspiration/penetration, recommend to continue current diet at this time given results of objective testing 4/11 with patient under close supervision for all POs. POs should only be consumed if pt is fully alert to decrease the risk of aspiration while still enabling her to utilize the musculature to strengthen her swallow. SLP to f/u closely to assess diet tolerance and possible diet change. Continue with Current Diet: Dysphagia 1 (puree);Honey-thick liquid Liquids provided via: Teaspoon Medication Administration: Crushed with puree Supervision: Patient able to self feed;Full supervision/cueing for compensatory strategies Compensations: Slow rate;Small sips/bites Postural Changes and/or Swallow Maneuvers: Seated upright 90 degrees;Upright 30-60 min after meal Oral Care Recommendations: Oral care BID Plan: Continue with current plan of care Swallowing Goals  SLP Swallowing Goals Patient will consume recommended diet without observed clinical signs of aspiration with: Moderate assistance Swallow Study Goal #1 - Progress: Progressing toward goal Patient will utilize recommended strategies during swallow to increase swallowing safety with: Moderate assistance Swallow Study Goal #2 - Progress:  Progressing toward goal  General Temperature Spikes Noted: Yes (temperature spike overnight) Respiratory Status: Supplemental O2 delivered via (comment) (trach collar at 28% FiO2) Behavior/Cognition: Alert;Requires cueing;Decreased sustained attention;Distractible;Impulsive Oral Cavity - Dentition: Dentures, top;Dentures, bottom (dentures not in place) Patient Positioning: Upright in bed  Oral Cavity - Oral Hygiene Does patient have any of the following "at risk" factors?: Diet - patient on thickened liquids Brush patient's teeth BID with toothbrush (using toothpaste with fluoride): Yes Patient is AT RISK - Oral Care Protocol followed (see row info): Yes   Dysphagia Treatment Treatment focused on: Skilled observation of diet tolerance;Patient/family/caregiver education;Other (comment) (PMSV tolerance) Family/Caregiver Educated: daughter Treatment Methods/Modalities: Skilled observation Patient observed directly with PO's: Yes Type of PO's observed: Dysphagia 1 (puree);Honey-thick liquids Feeding: Able to feed self Liquids provided via: Teaspoon Pharyngeal Phase Signs & Symptoms: Delayed cough Type of cueing: Verbal;Tactile Amount of cueing: Moderate   Maxcine Ham 01/31/2012, 11:54 AM   Maxcine Ham, SLP Student

## 2012-02-01 LAB — BASIC METABOLIC PANEL
Chloride: 111 mEq/L (ref 96–112)
GFR calc Af Amer: 58 mL/min — ABNORMAL LOW (ref >90)
GFR calc non Af Amer: 50 mL/min — ABNORMAL LOW (ref >90)
Potassium: 4 mEq/L (ref 3.5–5.1)
Sodium: 147 mEq/L — ABNORMAL HIGH (ref 135–145)

## 2012-02-01 LAB — GLUCOSE, CAPILLARY
Glucose-Capillary: 167 mg/dL — ABNORMAL HIGH (ref 70–99)
Glucose-Capillary: 156 mg/dL — ABNORMAL HIGH (ref 70–99)
Glucose-Capillary: 131 mg/dL — ABNORMAL HIGH (ref 70–99)

## 2012-02-01 MED ORDER — STARCH (THICKENING) PO POWD
ORAL | Status: DC | PRN
  Filled 2012-02-01 (×2): qty 227

## 2012-02-01 MED ORDER — METOPROLOL TARTRATE 25 MG/10 ML ORAL SUSPENSION
25.0000 mg | Freq: Two times a day (BID) | ORAL | Status: DC
  Administered 2012-02-01 (×2): 25 mg
  Filled 2012-02-01 (×5): qty 10

## 2012-02-01 MED ORDER — METOPROLOL TARTRATE 1 MG/ML IV SOLN
5.0000 mg | INTRAVENOUS | Status: DC | PRN
  Administered 2012-02-01 – 2012-02-03 (×5): 5 mg via INTRAVENOUS
  Filled 2012-02-01 (×4): qty 5

## 2012-02-01 NOTE — Progress Notes (Signed)
Emily Harrington is a 61 y.o. female admitted on 01/08/2012 with Pneumococcal bacteremia, PEA arrest.  Transitioned to SDU 3/23.  Developed increasing dyspnea, and change in mental status and transfer back to ICU 3/30.  Line/tube: (Truncated 4/8) ETT 3/30>>4/4 ETT 4/4>>4/9 Lt IJ CVL 3/30>>4/8 Rt PICC 4/8>> Trach (DF) 4/9>>  Cultures: (Truncated 4/8) Blood 3/20>>Pneumococcus Sputum 3/20>>E coli BAL 4/1>>Candida C diff 4/2>>negative  Antibiotics: (Truncated 4/8) Zosyn 3/30>>4/8 Vancomycin 3/30>>4/3  Tests/events: 3/20 Echo>>EF 55 to 60%, mild LVH 3/20 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 3/20 CT abd/pelvis>>Large Rt renal pelvis stone, avascular necrosis Lt femoral head 3/22 EEG>>non-specific slow rhythm, no epileptiform activity 3/27 Renal u/s>>Large Rt renal pelvis stone w/o hydronephrosis 3/29 CT chest>>decrease RUL ASD, patchy LUL ASD/ATX, b/l effusions, multiple rib fx's and probable sternal fx 3/29 Doppler legs>>no DVT in either Rt or Lt legs 4/1 Bronchoscopy>>atypical cells in cytology from RUL 4/4 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  Still needs nocturnal CPAP.    OBJECTIVE:  Blood pressure 168/78, pulse 128, temperature 99 F (37.2 C), temperature source Oral, resp. rate 24, height 5\' 5"  (1.651 m), weight 81.1 kg (178 lb 12.7 oz), SpO2 93.00%. Wt Readings from Last 3 Encounters:  02/01/12 81.1 kg (178 lb 12.7 oz)   Body mass index is 29.75 kg/(m^2).  I/O last 3 completed shifts: In: 1987.1 [I.V.:787.1; NG/GT:1200] Out: 3090 [Urine:2940; Stool:150]  Vent Mode:  [-] PRVC FiO2 (%):  [28 %-100 %] 35 % Set Rate:  [14 bmp] 14 bmp Vt Set:  [450 mL] 450 mL PEEP:  [5 cmH20] 5 cmH20 Pressure Support:  [10 cmH20] 10 cmH20  Physical Exam: General - no distress HEENT - Trach site clean Cardiac - s1s2 irregular,tachy,  no murmur Chest - scattered rhonchi, no wheeze Abd - soft, nontender, + bowel  sounds GU - foley in place Ext - no edema Neuro - follows commands  CBC    Component Value Date/Time   WBC 10.4 01/31/2012 0500   RBC 3.10* 01/31/2012 0500   HGB 8.6* 01/31/2012 0500   HCT 27.0* 01/31/2012 0500   PLT 331 01/31/2012 0500   MCV 87.1 01/31/2012 0500   MCH 27.7 01/31/2012 0500   MCHC 31.9 01/31/2012 0500   RDW 16.0* 01/31/2012 0500   LYMPHSABS 1.0 01/11/2012 0340   MONOABS 0.7 01/11/2012 0340   EOSABS 0.0 01/11/2012 0340   BASOSABS 0.0 01/11/2012 0340    BMET    Component Value Date/Time   NA 147* 02/01/2012 0340   K 4.0 02/01/2012 0340   CL 111 02/01/2012 0340   CO2 27 02/01/2012 0340   GLUCOSE 134* 02/01/2012 0340   BUN 13 02/01/2012 0340   CREATININE 1.15* 02/01/2012 0340   CALCIUM 9.8 02/01/2012 0340   GFRNONAA 50* 02/01/2012 0340   GFRAA 58* 02/01/2012 0340    Dg Chest Port 1 View  01/31/2012  *RADIOLOGY REPORT*  Clinical Data: 61 year old female with pneumonia, sepsis, respiratory failure, cardiac arrest.  PORTABLE CHEST - 1 VIEW  Comparison: 01/29/2012 and earlier.  Findings: AP portable semi upright view 0551 hours.  Stable tracheostomy tube.  Enteric feeding tube now in place, tip not included.  Stable right PICC line.  Stable lung volumes.  Continued peripheral right upper lobe consolidation.  Stable ventilation.  No pneumothorax, pulmonary edema or effusion.  IMPRESSION: 1. Stable lines and tubes. 2.  Stable right upper lobe consolidation.  No new cardiopulmonary abnormality.  Original Report Authenticated By: Harley Hallmark, M.D.  ASSESSMENT/PLAN:  Acute respiratory failure initially from Pneumococcal PNA/bacteremia, and E coli PNA.  Multiple rib fx after CPR.  Now with tracheomalacia limiting ability to proceed with extubation, thus requiring trach.  Has hx of asthma. -push to trach collar during day as tolerated>>see if she can go full 24 hrs off vent -f/u CXR intermittently to document clearing of RUL infiltrate -prn bronchodilators -completed course of  antibiotics -fitted with PM valve 4/11  Dysphagia -Swallow eval done 4/11>>Dysphagia 1 puree with honey thick liquid -speech therapy following  Acute renal failure >> resolved  Protein calorie malnutrition -continue tube feeds until able to take adequate oral feedings  Agitated delirium -continue schedule klonopin -d/c versed and precedex -prn ativan, fentanly  HTN, tachycardia -continue catapres -add low dose metoprolol, use IV prn   Hypokalemia -replace and f/u BMET  Hyperglycemia -SSI  Candidal dermatitis -nystatin powder  Anemia of chronic disease and critical illness -f/u CBC intermittently -transfuse for Hb < 7  Best practice -lovenox for DVT proph -protonix for SUP  Disposition -OOB - PT -keep foley, rectal tube in for now - vent-SDU bed -Will ask case manager to assess for LTAC placement>>insurance may be an issue  Yael Angerer V. [phone] 02/01/2012, 8:27 AM

## 2012-02-02 ENCOUNTER — Inpatient Hospital Stay (HOSPITAL_COMMUNITY): Payer: Medicaid Other

## 2012-02-02 LAB — GLUCOSE, CAPILLARY
Glucose-Capillary: 115 mg/dL — ABNORMAL HIGH (ref 70–99)
Glucose-Capillary: 131 mg/dL — ABNORMAL HIGH (ref 70–99)
Glucose-Capillary: 140 mg/dL — ABNORMAL HIGH (ref 70–99)

## 2012-02-02 LAB — BASIC METABOLIC PANEL
BUN: 16 mg/dL (ref 6–23)
Chloride: 107 mEq/L (ref 96–112)
GFR calc Af Amer: 63 mL/min — ABNORMAL LOW (ref >90)
GFR calc non Af Amer: 54 mL/min — ABNORMAL LOW (ref >90)
Potassium: 4 mEq/L (ref 3.5–5.1)
Sodium: 143 mEq/L (ref 135–145)

## 2012-02-02 LAB — CBC
HCT: 31.7 % — ABNORMAL LOW (ref 36.0–46.0)
Hemoglobin: 10 g/dL — ABNORMAL LOW (ref 12.0–15.0)
RBC: 3.55 MIL/uL — ABNORMAL LOW (ref 3.87–5.11)
WBC: 18.6 10*3/uL — ABNORMAL HIGH (ref 4.0–10.5)

## 2012-02-02 MED ORDER — ALTEPLASE 2 MG IJ SOLR
2.0000 mg | Freq: Once | INTRAMUSCULAR | Status: AC
  Administered 2012-02-03: 2 mg
  Filled 2012-02-02: qty 2

## 2012-02-02 MED ORDER — METOPROLOL TARTRATE 25 MG/10 ML ORAL SUSPENSION
50.0000 mg | Freq: Two times a day (BID) | ORAL | Status: DC
Start: 2012-02-02 — End: 2012-02-03
  Administered 2012-02-02 – 2012-02-03 (×2): 50 mg
  Filled 2012-02-02 (×4): qty 20

## 2012-02-02 MED ORDER — SODIUM CHLORIDE 0.9 % IV SOLN
3.0000 g | Freq: Four times a day (QID) | INTRAVENOUS | Status: DC
  Administered 2012-02-02 – 2012-02-07 (×19): 3 g via INTRAVENOUS
  Filled 2012-02-02 (×21): qty 3

## 2012-02-02 MED ORDER — METRONIDAZOLE 50 MG/ML ORAL SUSPENSION
500.0000 mg | Freq: Three times a day (TID) | ORAL | Status: DC
  Administered 2012-02-02 – 2012-02-07 (×16): 500 mg via ORAL
  Filled 2012-02-02 (×18): qty 10

## 2012-02-02 NOTE — Consult Note (Signed)
ANTIBIOTIC CONSULT NOTE - INITIAL  Pharmacy Consult for Unasyn Indication: Fever, tachycardia s/p complicated infectious course  Allergies  Allergen Reactions  . Nsaids     Patient Measurements: Height: 5\' 5"  (165.1 cm) Weight: 182 lb 8.7 oz (82.8 kg) IBW/kg (Calculated) : 57   Vital Signs: Temp: 98.9 F (37.2 C) (04/14 0800) Temp src: Oral (04/14 0800) BP: 157/92 mmHg (04/14 1004) Pulse Rate: 139  (04/14 1002) Intake/Output from previous day: 04/13 0701 - 04/14 0700 In: 1655 [P.O.:330; I.V.:460; NG/GT:865] Out: 1615 [Urine:1615] Intake/Output from this shift:    Labs:  Basename 02/02/12 0402 02/01/12 0340 01/31/12 0500  WBC 18.6* -- 10.4  HGB 10.0* -- 8.6*  PLT 372 -- 331  LABCREA -- -- --  CREATININE 1.07 1.15* 1.12*   Medical History: Past Medical History  Diagnosis Date  . Asthma    Assessment: Ms. Gretzinger is a 61 year old woman s/p complicated course of abx with pneumococcal bacteremia. Blood, resp, and Cdiff have been sent. Mild fever 4/14 to 100.6, WBC 18.6<---10.4, tachycardic to 139, RR 28. To begin Unasyn and PO metronidazole today.  Plan:  1. Unasyn 3 grams IV q6h for r/o bacteremia dose 2. F/U WBC, fever, Cx results  Liba Hulsey, [place] R, PharmD 02/02/2012,10:09 AM [phone]

## 2012-02-02 NOTE — Progress Notes (Signed)
Emily Harrington is a 61 y.o. female admitted on 01/08/2012 with Pneumococcal bacteremia, PEA arrest.  Transitioned to SDU 3/23.  Developed increasing dyspnea, and change in mental status and transfer back to ICU 3/30.  Line/tube: (Truncated 4/8) ETT 3/30>>4/4 ETT 4/4>>4/9 Lt IJ CVL 3/30>>4/8 Rt PICC 4/8>> Trach (DF) 4/9>>  Cultures: (Truncated 4/8) Blood 3/20>>Pneumococcus Sputum 3/20>>E coli BAL 4/1>>Candida C diff 4/2>>negative Blood 4/14 >> resp 4/14 >> c diff 4/14 >>  Antibiotics: (Truncated 4/8) Zosyn 3/30>>4/8 Vancomycin 3/30>>4/3 unasyn 4/14 >>  Tests/events: 3/20 Echo>>EF 55 to 60%, mild LVH 3/20 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 3/20 CT abd/pelvis>>Large Rt renal pelvis stone, avascular necrosis Lt femoral head 3/22 EEG>>non-specific slow rhythm, no epileptiform activity 3/27 Renal u/s>>Large Rt renal pelvis stone w/o hydronephrosis 3/29 CT chest>>decrease RUL ASD, patchy LUL ASD/ATX, b/l effusions, multiple rib fx's and probable sternal fx 3/29 Doppler legs>>no DVT in either Rt or Lt legs 4/1 Bronchoscopy>>atypical cells in cytology from RUL 4/4 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 rate 28, height 5\' 5"  (1.651 m), weight 82.8 kg (182 lb 8.7 oz), SpO2 97.00%. Wt Readings from Last 3 Encounters:  02/02/12 82.8 kg (182 lb 8.7 oz)   Body mass index is 30.38 kg/(m^2).  I/O last 3 completed shifts: In: 2150 [P.O.:330; I.V.:640; NG/GT:1180] Out: 2415 [Urine:2315; Stool:100]  Vent Mode:  [-]  FiO2 (%):  [35 %] 35 %  Physical Exam: General - no distress HEENT - Trach site clean Cardiac - s1s2 irregular,tachy,  no murmur Chest - scattered rhonchi, no wheeze Abd - soft, nontender, + bowel sounds GU - foley in place Ext - no edema Neuro -  follows commands  CBC    Component Value Date/Time   WBC 18.6* 02/02/2012 0402   RBC 3.55* 02/02/2012 0402   HGB 10.0* 02/02/2012 0402   HCT 31.7* 02/02/2012 0402   PLT 372 02/02/2012 0402   MCV 89.3 02/02/2012 0402   MCH 28.2 02/02/2012 0402   MCHC 31.5 02/02/2012 0402   RDW 16.7* 02/02/2012 0402   LYMPHSABS 1.0 01/11/2012 0340   MONOABS 0.7 01/11/2012 0340   EOSABS 0.0 01/11/2012 0340   BASOSABS 0.0 01/11/2012 0340    BMET    Component Value Date/Time   NA 143 02/02/2012 0402   K 4.0 02/02/2012 0402   CL 107 02/02/2012 0402   CO2 28 02/02/2012 0402   GLUCOSE 136* 02/02/2012 0402   BUN 16 02/02/2012 0402   CREATININE 1.07 02/02/2012 0402   CALCIUM 9.9 02/02/2012 0402   GFRNONAA 54* 02/02/2012 0402   GFRAA 63* 02/02/2012 0402    No results found.  ASSESSMENT/PLAN:  Acute respiratory failure initially from Pneumococcal PNA/bacteremia, and E coli PNA.  Multiple rib fx after CPR.  Now with tracheomalacia limiting ability to proceed with extubation, thus requiring trach.  Has hx of asthma. -push to trach collar during day as tolerated>>see if she can go full 24 hrs off vent -f/u CXR intermittently to document clearing of RUL infiltrate -prn bronchodilators  -fitted with PM valve 4/11  ID --Unexplained fever, tachy -completed course of antibiotics -re culture -Empiric unasyn & po flagyl , chk stool c diff  Dysphagia -Swallow eval done 4/11>>Dysphagia 1 puree with honey thick liquid -speech therapy following -keep npo since mild resp distress - concern for sub clinical aspiration  Acute renal failure >> resolved  Protein calorie malnutrition -continue tube feeds until able to take adequate oral feedings  Agitated delirium -continue schedule klonopin -d/c versed and precedex -prn ativan, fentanyl  HTN, tachycardia -continue catapres -Increase metoprolol 50 bid, use IV prn   Hypokalemia -replace and f/u BMET  Hyperglycemia -SSI  Candidal dermatitis -nystatin  powder  Anemia of chronic disease and critical illness -f/u CBC intermittently -transfuse for Hb < 7  Best practice -lovenox for DVT proph -protonix for SUP  Disposition -OOB - PT -keep foley, rectal tube in for now - vent-SDU bed -Will ask case manager to assess for LTAC placement>>insurance may be an issue Updated daughter in detail  [facility] V. [phone] 02/02/2012, 9:31 AM

## 2012-02-02 NOTE — Progress Notes (Signed)
Speech Language Pathology Treatment  Patient Details Name: Emily Harrington MRN: [id number] DOB: September 01, 1951 Today's Date: 02/02/2012  SLP Assessment/Plan/Recommendation    SLP Goals  SLP Goals Potential to Achieve Goals: Good  General Temperature Spikes Noted: No Respiratory Status: Supplemental O2 delivered via (comment) (trach collar) Behavior/Cognition: Alert;Cooperative;Pleasant mood Oral Cavity - Dentition: Dentures, top;Dentures, bottom Patient Positioning: Partially reclined  Oral Cavity - Oral Hygiene Does patient have any of the following "at risk" factors?: Oxygen therapy - cannula, mask, simple oxygen devices Patient is HIGH RISK - Oral Care Protocol followed (see row info): Yes   Treatment Treatment focused on: Other (comment) (Dysphagia treatment) Skilled Treatment: Patient seen for diagnostic treatment for diet tolerance of puree consistency and honey thick liquid by spoon.  RN requested to defer PMSV trial this date due to increased heart rate (140).  Patient's daughter and grandson present  during treatment.  Patient repositioned x3 for PO trials as she tends to reposition herself sideways on bed with legs over side rail.  RT assisted with repositioning initially.  Patient administered magic cup x1 teaspoon. Initiation of swallow WFL but increased WOB noted.  RN reports increase in labored breathing this date.  Recommend to NPO status with exception of ice chips per MD order.  ST to follow on 02/03/12 for PO readiness and for PMSV trials.   Moreen Fowler MS, CCC-SLP [phone]  [facility] 02/02/2012, 2:51 PM

## 2012-02-03 ENCOUNTER — Inpatient Hospital Stay (HOSPITAL_COMMUNITY): Payer: Medicaid Other

## 2012-02-03 LAB — GLUCOSE, CAPILLARY
Glucose-Capillary: 136 mg/dL — ABNORMAL HIGH (ref 70–99)
Glucose-Capillary: 161 mg/dL — ABNORMAL HIGH (ref 70–99)
Glucose-Capillary: 92 mg/dL (ref 70–99)
Glucose-Capillary: 103 mg/dL — ABNORMAL HIGH (ref 70–99)

## 2012-02-03 LAB — CBC
Hemoglobin: 9.4 g/dL — ABNORMAL LOW (ref 12.0–15.0)
MCH: 28.1 pg (ref 26.0–34.0)
RBC: 3.34 MIL/uL — ABNORMAL LOW (ref 3.87–5.11)

## 2012-02-03 LAB — BASIC METABOLIC PANEL
GFR calc Af Amer: 63 mL/min — ABNORMAL LOW (ref >90)
GFR calc non Af Amer: 54 mL/min — ABNORMAL LOW (ref >90)
Glucose, Bld: 121 mg/dL — ABNORMAL HIGH (ref 70–99)
Potassium: 3.7 mEq/L (ref 3.5–5.1)
Sodium: 146 mEq/L — ABNORMAL HIGH (ref 135–145)

## 2012-02-03 LAB — PROCALCITONIN: Procalcitonin: 0.23 ng/mL

## 2012-02-03 MED ORDER — METOPROLOL TARTRATE 25 MG/10 ML ORAL SUSPENSION
75.0000 mg | Freq: Two times a day (BID) | ORAL | Status: DC
  Administered 2012-02-03 – 2012-02-07 (×8): 75 mg
  Filled 2012-02-03 (×10): qty 30

## 2012-02-03 NOTE — Progress Notes (Signed)
Emily Harrington is a 61 y.o. female admitted on 01/08/2012 with Pneumococcal bacteremia, PEA arrest.  Transitioned to SDU 3/23.  Developed increasing dyspnea, and change in mental status and transfer back to ICU 3/30. Tracheostomy on 4/9 & off vent since 4/13  Line/tube: (Truncated 4/8) ETT 3/30>>4/4 ETT 4/4>>4/9 Lt IJ CVL 3/30>>4/8 Rt PICC 4/8>> Trach (DF) 4/9>>  Cultures: (Truncated 4/8) Blood 3/20>>Pneumococcus Sputum 3/20>>E coli BAL 4/1>>Candida C diff 4/2>>negative Blood 4/14 >> resp 4/14 >> c diff 4/14 >>  Antibiotics: (Truncated 4/8) Zosyn 3/30>>4/8 Vancomycin 3/30>>4/3 unasyn 4/14 >>  Tests/events: 3/20 Echo>>EF 55 to 60%, mild LVH 3/20 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 3/20 CT abd/pelvis>>Large Rt renal pelvis stone, avascular necrosis Lt femoral head 3/22 EEG>>non-specific slow rhythm, no epileptiform activity 3/27 Renal u/s>>Large Rt renal pelvis stone w/o hydronephrosis 3/29 CT chest>>decrease RUL ASD, patchy LUL ASD/ATX, b/l effusions, multiple rib fx's and probable sternal fx 3/29 Doppler legs>>no DVT in either Rt or Lt legs 4/1 Bronchoscopy>>atypical cells in cytology from RUL 4/4 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 rate 25, height 5\' 5"  (1.651 m), weight 81.9 kg (180 lb 8.9 oz), SpO2 97.00%. Wt Readings from Last 3 Encounters:  02/03/12 81.9 kg (180 lb 8.9 oz)   Body mass index is 30.05 kg/(m^2).  I/O last 3 completed shifts: In: 2270 [P.O.:405; I.V.:580; NG/GT:1085; IV Piggyback:200] Out: 1390 [Urine:1390]  Vent Mode:  [-]  FiO2 (%):  [35 %] 35 %  Physical Exam: General - mild resp distress HEENT - Trach site clean Cardiac - s1s2 irregular,tachy,  no murmur Chest - scattered rhonchi, no  wheeze Abd - soft, nontender, + bowel sounds GU - foley in place Ext - no edema Neuro - follows commands  CBC    Component Value Date/Time   WBC 18.6* 02/03/2012 0403   RBC 3.34* 02/03/2012 0403   HGB 9.4* 02/03/2012 0403   HCT 30.0* 02/03/2012 0403   PLT 364 02/03/2012 0403   MCV 89.8 02/03/2012 0403   MCH 28.1 02/03/2012 0403   MCHC 31.3 02/03/2012 0403   RDW 17.0* 02/03/2012 0403   LYMPHSABS 1.0 01/11/2012 0340   MONOABS 0.7 01/11/2012 0340   EOSABS 0.0 01/11/2012 0340   BASOSABS 0.0 01/11/2012 0340    BMET    Component Value Date/Time   NA 146* 02/03/2012 0403   K 3.7 02/03/2012 0403   CL 108 02/03/2012 0403   CO2 31 02/03/2012 0403   GLUCOSE 121* 02/03/2012 0403   BUN 20 02/03/2012 0403   CREATININE 1.07 02/03/2012 0403   CALCIUM 9.7 02/03/2012 0403   GFRNONAA 54* 02/03/2012 0403   GFRAA 63* 02/03/2012 0403    Dg Chest Port 1 View  02/03/2012  *RADIOLOGY REPORT*  Clinical Data: Shortness of breath, evaluate pneumonia  PORTABLE CHEST - 1 VIEW  Comparison: 01/31/2012; 01/29/2012; 01/28/2012; 01/21/2012; 01/16/2012; chest CT - 01/17/2012  Findings:  Grossly unchanged cardiac silhouette and mediastinal contours. Stable positioning of support apparatus. Peripheral consolidative opacity within the right upper lobe is grossly unchanged.  No definite pleural effusion or pneumothorax.  Grossly unchanged bones.  IMPRESSION: 1.  Stable positioning of support apparatus.  No pneumothorax. 2.  Grossly unchanged consolidative opacity within the right upper lobe.  Original Report Authenticated By: Waynard Reeds, M.D.   Dg Abd  Portable 1v  02/02/2012  *RADIOLOGY REPORT*  Clinical Data: Panda tube placement.  PORTABLE ABDOMEN - 1 VIEW  Comparison: Abdominal radiograph performed 01/29/2012  Findings: The patient's Panda tube is noted ending about the pylorus.  The visualized bowel gas pattern is grossly unremarkable.  A large stone is again noted at the right renal pelvis.  No free intra- abdominal air is  identified, though evaluation for free air is suboptimal on a single supine view.  No acute osseous abnormalities are seen.  IMPRESSION: Panda tube noted ending about the pylorus.  Original Report Authenticated By: Tonia Ghent, M.D.    ASSESSMENT/PLAN:  Acute respiratory failure initially from Pneumococcal PNA/bacteremia, and E coli PNA.  Multiple rib fx after CPR.  Now with tracheomalacia limiting ability to proceed with extubation, thus requiring trach.  Has hx of asthma. - trach collar as tolerated -prn bronchodilators -fitted with PM valve 4/11  ID --Unexplained fever, tachy 4/14 -Empiric unasyn & po flagyl , await cx & stool c diff  Dysphagia -Swallow eval done 4/11>>Dysphagia 1 puree with honey thick liquid -speech therapy following -keep npo since mild resp distress - concern for sub clinical aspiration  Acute renal failure >> resolved  Protein calorie malnutrition -continue tube feeds until able to take adequate oral feedings  Agitated delirium -continue scheduled klonopin -d/c versed and precedex -prn ativan, fentanyl  HTN, tachycardia -continue catapres -Increase metoprolol 50 bid, use IV prn   Hypokalemia -replace and f/u BMET  Hyperglycemia -SSI  Candidal dermatitis -nystatin powder  Anemia of chronic disease and critical illness -f/u CBC intermittently -transfuse for Hb < 7  Best practice -lovenox for DVT proph -protonix for SUP  Disposition -OOB - PT -foley, rectal tube per nursing - vent-SDU bed -Will ask case manager to assess for LTAC placement>>insurance  an issue Updated daughter in detail 4/14  [facility] V. [phone] 02/03/2012, 10:39 AM

## 2012-02-03 NOTE — Progress Notes (Signed)
Physical therapy called x 2 re:  Assisting pt with Phys therapy - state they will not be by today.  Explained that MD wishes pt to get pt and be assisted up to chair .  States they will definitely see pt tomorrow.

## 2012-02-03 NOTE — Progress Notes (Signed)
Phys. Therapy called re:  Need to assist pt with physical therapy.  Indicates they will not be by today.  Nurse and two techs attempted to get pt out of bed - Pt could balance on side of bed but unable to stand.  Replaced in bed.

## 2012-02-04 LAB — CBC
HCT: 30.2 % — ABNORMAL LOW (ref 36.0–46.0)
MCH: 28.2 pg (ref 26.0–34.0)
MCV: 92.6 fL (ref 78.0–100.0)
Platelets: 347 10*3/uL (ref 150–400)
RDW: 17.8 % — ABNORMAL HIGH (ref 11.5–15.5)

## 2012-02-04 LAB — BASIC METABOLIC PANEL
BUN: 16 mg/dL (ref 6–23)
CO2: 33 mEq/L — ABNORMAL HIGH (ref 19–32)
Calcium: 9.8 mg/dL (ref 8.4–10.5)
Chloride: 113 mEq/L — ABNORMAL HIGH (ref 96–112)
Creatinine, Ser: 1.1 mg/dL (ref 0.50–1.10)
Glucose, Bld: 118 mg/dL — ABNORMAL HIGH (ref 70–99)

## 2012-02-04 LAB — GLUCOSE, CAPILLARY: Glucose-Capillary: 153 mg/dL — ABNORMAL HIGH (ref 70–99)

## 2012-02-04 MED ORDER — PROMOTE PO LIQD
1000.0000 mL | ORAL | Status: DC
  Administered 2012-02-04 – 2012-02-06 (×3): 1000 mL
  Filled 2012-02-04 (×3): qty 1000

## 2012-02-04 NOTE — Progress Notes (Signed)
Nutrition Follow-up  Diet Order:  NPO Pt is being followed by SLP for swallow treatment with honey thick liquids and puree. Pt remains with panda tube, Promote currently running at 35 ml/hr. This provides 840 kcal, 52 gm protein. Orders were written incorrectly and should be for 55 ml/hr. RD will correct orders. This would provide 1320 kcal, 82 gm protein. Pt is also receiving 30 ml Pro-stat BID via tube for an additional 144 kcal and 30 gm protein.  Pt off vent support 4/13, Tracheostomy on 4/9. Per RN pt is tolerating TF well with minimal residuals.    Meds: Scheduled Meds:   . ampicillin-sulbactam (UNASYN) IV  3 g Intravenous Q6H  . antiseptic oral rinse  15 mL Mouth Rinse QID  . aspirin EC  162 mg Oral Daily  . chlorhexidine  15 mL Mouth Rinse BID  . clonazePAM  1 mg Per Tube BID  . cloNIDine  0.1 mg Per Tube BID  . enoxaparin (LOVENOX) injection  40 mg Subcutaneous Q24H  . feeding supplement  30 mL Per Tube BID  . feeding supplement (PROMOTE)  1,000 mL Per Tube Q24H  . Gerhardt's butt cream   Topical BID  . insulin aspart  0-15 Units Subcutaneous Q4H  . metoprolol tartrate  75 mg Per Tube BID  . metroNIDAZOLE  500 mg Oral TID  . nystatin   Topical TID  . pantoprazole sodium  40 mg Per Tube Q1200  . sodium chloride  10-40 mL Intracatheter Q12H   Continuous Infusions:   . sodium chloride 1,000 mL (02/03/12 0021)   PRN Meds:.acetaminophen (TYLENOL) oral liquid 160 mg/5 mL, albuterol, fentaNYL, food thickener, ipratropium, LORazepam, metoprolol, sodium chloride  Labs:  CMP     Component Value Date/Time   NA 153* 02/04/2012 0530   K 3.8 02/04/2012 0530   CL 113* 02/04/2012 0530   CO2 33* 02/04/2012 0530   GLUCOSE 118* 02/04/2012 0530   BUN 16 02/04/2012 0530   CREATININE 1.10 02/04/2012 0530   CALCIUM 9.8 02/04/2012 0530   PROT 5.9* 01/17/2012 0425   ALBUMIN 2.1* 01/17/2012 0425   AST 30 01/17/2012 0425   ALT 42* 01/17/2012 0425   ALKPHOS 158* 01/17/2012 0425   BILITOT 0.2*  01/17/2012 0425   GFRNONAA 53* 02/04/2012 0530   GFRAA 61* 02/04/2012 0530     Intake/Output Summary (Last 24 hours) at 02/04/12 1245 Last data filed at 02/04/12 1100  Gross per 24 hour  Intake   1675 ml  Output    975 ml  Net    700 ml    Weight Status:  181 lbs, overall trending down from admission weight, stable x 5 days  Re-estimated needs:  1500-1700 kcal, 80-90 gm protien  Nutrition Dx:  Inadequate oral intake, ongoing  Goal:  Meet >90% of estimated nutrition needs with TF, unmet  Intervention:   1. Clarify orders/increase Promote TF to 65 ml/hr to better meet nutrition needs  2. D/C Pro-stat  This will provide 1560 kcal, 98 gm protein, and 1309 ml free water (additional free water via PICC line meets hydration needs) 3. RD will continue to follow  Monitor:  TF, weight, diet advance, labs   Rudean Haskell Pager #:  [phone]

## 2012-02-04 NOTE — Progress Notes (Signed)
SLP Cancellation Note  Treatment cancelled today. Per RN, patient has been very restless and now resting comfortably. Reviewed chart and clarified NPO status recommended by SLP during previous treatment session. Although patient initially recommended to initiate a diet following FEES, overall poor tolerance of both pos and PMSV since then, warrants f/u differential diagnosis of abilities prior to resuming po diet or use of PMSV. Recommend patient remain NPO with PMSV trials with SLP only. RN aware. Will f/u 4/17.  Ferdinand Lango MA, CCC-SLP [phone]   Ferdinand Lango Meryl 02/04/2012, 2:28 PM

## 2012-02-04 NOTE — Progress Notes (Signed)
Emily Harrington is a 61 y.o. female admitted on 01/08/2012 with Pneumococcal bacteremia, PEA arrest.  Transitioned to SDU 3/23.  Developed increasing dyspnea, and change in mental status and transfer back to ICU 3/30. Tracheostomy on 4/9 & off vent since 4/13  Line/tube: (Truncated 4/8) ETT 3/30>>4/4 ETT 4/4>>4/9 Lt IJ CVL 3/30>>4/8 Rt PICC 4/8>> Trach (DF) 4/9>>  Cultures: (Truncated 4/8) Blood 3/20>>Pneumococcus Sputum 3/20>>E coli BAL 4/1>>Candida C diff 4/2>>negative Blood 4/14 >>ng resp 4/14 >>candida   Antibiotics: (Truncated 4/8) Zosyn 3/30>>4/8 Vancomycin 3/30>>4/3 unasyn 4/14 (? 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 rate 13, height 5\' 5"  (1.651 m), weight 82.3 kg (181 lb 7 oz), SpO2 99.00%. Wt Readings from Last 3 Encounters:  02/04/12 82.3 kg (181 lb 7 oz)   Body mass index is 30.19 kg/(m^2).  I/O last 3 completed shifts: In: 2440 [I.V.:620; NG/GT:1320; IV Piggyback:500] Out: 1350 [Urine:1350]  Vent Mode:  [-]  FiO2 (%):  [28 %-35 %] 28 %  Physical Exam: General - mild resp distress HEENT - Trach site clean Cardiac - s1s2 irregular,tachy,  no murmur Chest - scattered rhonchi, no  wheeze Abd - soft, nontender, + bowel sounds GU - foley in place Ext - no edema Neuro - follows commands  CBC    Component Value Date/Time   WBC 16.7* 02/04/2012 0530   RBC 3.26* 02/04/2012 0530   HGB 9.2* 02/04/2012 0530   HCT 30.2* 02/04/2012 0530   PLT 347 02/04/2012 0530   MCV 92.6 02/04/2012 0530   MCH 28.2 02/04/2012 0530   MCHC 30.5 02/04/2012 0530   RDW 17.8* 02/04/2012 0530   LYMPHSABS 1.0 01/11/2012 0340   MONOABS 0.7 01/11/2012 0340   EOSABS 0.0 01/11/2012 0340   BASOSABS 0.0 01/11/2012 0340    BMET    Component Value Date/Time   NA 153* 02/04/2012 0530   K 3.8 02/04/2012 0530   CL 113* 02/04/2012 0530   CO2 33* 02/04/2012 0530   GLUCOSE 118* 02/04/2012 0530   BUN 16 02/04/2012 0530   CREATININE 1.10 02/04/2012 0530   CALCIUM 9.8 02/04/2012 0530   GFRNONAA 53* 02/04/2012 0530   GFRAA 61* 02/04/2012 0530    Dg Chest Port 1 View  02/03/2012  *RADIOLOGY REPORT*  Clinical Data: Shortness of breath, evaluate pneumonia  PORTABLE CHEST - 1 VIEW  Comparison: 01/31/2012; 01/29/2012; 01/28/2012; 01/21/2012; 01/16/2012; chest CT - 01/17/2012  Findings:  Grossly unchanged cardiac silhouette and mediastinal contours. Stable positioning of support apparatus. Peripheral consolidative opacity within the right upper lobe is grossly unchanged.  No definite pleural effusion or pneumothorax.  Grossly unchanged bones.  IMPRESSION: 1.  Stable positioning of support apparatus.  No pneumothorax. 2.  Grossly unchanged consolidative opacity within the right upper lobe.  Original Report Authenticated By: Judene Companion.D.  Dg Abd Portable 1v  02/03/2012  *RADIOLOGY REPORT*  Clinical Data: Feeding tube placement.  PORTABLE ABDOMEN - 1 VIEW  Comparison: Single view of the abdomen 02/02/2012.  Findings: Panda tube is in place with the tip in the upper body of the stomach.  Large right renal stone as described on prior exams noted.  IMPRESSION: Tip of the patient's feeding tube is in the upper body of the  stomach.  Original Report Authenticated By: Bernadene Bell. Maricela Curet, M.D.   Dg Abd Portable 1v  02/02/2012  *RADIOLOGY REPORT*  Clinical Data: Panda tube placement.  PORTABLE ABDOMEN - 1 VIEW  Comparison: Abdominal radiograph performed 01/29/2012  Findings: The patient's Panda tube is noted ending about the pylorus.  The visualized bowel gas pattern is grossly unremarkable.  A large stone is again noted at the right renal pelvis.  No free intra- abdominal air is identified, though evaluation for free air is suboptimal on a single supine view.  No acute osseous abnormalities are seen.  IMPRESSION: Panda tube noted ending about the pylorus.  Original Report Authenticated By: Tonia Ghent, M.D.    ASSESSMENT/PLAN:  Acute respiratory failure initially from Pneumococcal PNA/bacteremia, and E coli PNA.  Multiple rib fx after CPR.  Now with tracheomalacia limiting ability to proceed with extubation, thus requiring trach.  Has hx of asthma. - trach collar as tolerated -prn bronchodilators -fitted with PM valve 4/11  ID --Unexplained fever, tachy 4/14 -Empiric unasyn & po flagyl ,  cx neg , stool c diff not sent  Dysphagia -Swallow eval done 4/11>>Dysphagia 1 puree with honey thick liquid -speech therapy following -ok to advance now  Acute renal failure >> resolved  Protein calorie malnutrition -continue tube feeds until able to take adequate oral feedings  Agitated delirium -continue scheduled klonopin -d/c versed and precedex -prn ativan, fentanyl  HTN, tachycardia -continue catapres -Increase metoprolol 75 bid, use IV prn   Hypokalemia -replace and f/u BMET  Hyperglycemia -SSI  Candidal dermatitis -nystatin powder  Anemia of chronic disease and critical illness -f/u CBC intermittently -transfuse for Hb < 7  Best practice -lovenox for DVT proph -protonix for SUP  Disposition -OOB - PT -foley, rectal tube per nursing - vent-SDU bed -Will ask case manager to assess for  LTAC placement>>insurance  an issue Updated daughter in detail 4/16  [facility] V. [phone] 02/04/2012, 10:25 AM

## 2012-02-04 NOTE — Progress Notes (Signed)
Physical Therapy Treatment Patient Details Name: Emily Harrington MRN: [id number] DOB: 1951-01-01 Today's Date: 02/04/2012  PT Assessment/Plan Comments on Treatment Session: Pt has had a prolonged hospitalization with multiple respiratory complications and ultimately requiring tracheostomy. Pt's functional recovery limited by her willingness to participate and inability to ambulate longer distances due to no tele box available.  PT Plan: Discharge plan remains appropriate;Frequency remains appropriate PT Frequency: Min 3X/week Recommendations for Other Services: OT consult Follow Up Recommendations: Skilled nursing facility;Supervision/Assistance - 24 hour Equipment Recommended: Defer to next venue PT Goals  Acute Rehab PT Goals PT Goal: Sit at Edge Of Bed - Progress: Progressing toward goal PT Goal: Sit to Stand - Progress: Progressing toward goal PT Goal: Stand to Sit - Progress: Progressing toward goal PT Goal: Stand - Progress: Progressing toward goal PT Goal: Ambulate - Progress: Progressing toward goal PT Goal: Perform Home Exercise Program - Progress: Not progressing  PT Treatment Precautions/Restrictions  Precautions Precautions: Fall Precaution Comments: Sternum very sore secondary to chest compressions  Required Braces or Orthoses DO NOT USE: No Restrictions Weight Bearing Restrictions: No Mobility (including Balance) Transfers Sit to Stand: 4: Min assist;With upper extremity assist;From chair/3-in-1;With armrests Sit to Stand Details (indicate cue type and reason): Pt able to come to upright sitting at edge of chair (from semi-reclined position) with minimal assist. Pt stood x 3 from recliner, utilizing arms to push from armrests wtih no signs of sternal pain (and later denied sternal/rib pain).  Stand to Sit: 4: Min assist;With upper extremity assist;With armrests;To chair/3-in-1 Stand to Sit Details: x 3 with increasing safety each trial. Pt initially impulsive and  began to sit before fully backing up to chair...on subsequent trials, pt very mindful of how close she was to the chair. Ambulation/Gait Ambulation/Gait Assistance: 1: +2 Total assist;Patient percentage (comment) Ambulation/Gait Assistance Details (indicate cue type and reason): pt=80%; requires second person due to impulsivity and decreased balance with narrow base of support and some scissoring. Pt not at all mindful of lines/tubes.  Ambulation Distance (Feet): 6 Feet (3 ft x 2 (very limited by lines/tubes and no tele box)) Assistive device: 2 person hand held assist  Static Sitting Balance Static Sitting - Balance Support: Bilateral upper extremity supported;Feet supported Static Sitting - Level of Assistance: 5: Stand by assistance Static Sitting - Comment/# of Minutes: sat at edge of chair total of 10 minutes (rest periods and standing periods interspersed) Static Standing Balance Static Standing - Balance Support: Bilateral upper extremity supported Static Standing - Level of Assistance: 4: Min assist Static Standing - Comment/# of Minutes: x 3 trials; up to 2 minutes at a time; decr attention with incr sway and slow reactions Exercise  Other Exercises Other Exercises: Attempted to get pt to do standing marching with pt refusing; once seated, attempted to get pt to perform seated marching, LAQ, ankle pumps, SLR (once reclined) with pt smiling and refusing to participate.  Pt educated on benefits of exercise and carryover to improving function and continued to refuse. Attempted to get pt to tell me what activities she enjoyed PTA and what she wanted to get back to doing. She refused to answer my questions. End of Session PT - End of Session Equipment Utilized During Treatment: Gait belt Activity Tolerance: Patient tolerated treatment well;Other (comment) (no anxiety today; limited by refusal to participate) Patient left: in chair;with call bell in reach;Other (comment) Psychiatrist  present) Nurse Communication: Mobility status for transfers General Behavior During Session: Other (comment) (impulsive) Cognition: Impaired Cognitive Impairment:  decr awareness of limitations  Susie Ehresman 02/04/2012, 2:03 PM Pager [phone]

## 2012-02-04 NOTE — Progress Notes (Signed)
Clinical Child psychotherapist staffed case with [employer] and Charity fundraiser.  CSW reviewed chart.  CSW submitted updated clinical information to [facility] which is the only facility who is "considering" pt for rehab in the [facility] area (dtr prefers [place] area).  Per [facility], pt will need to be trached for 2-3 weeks before they would be able to accept pt.  CSW to continue to follow and assist as needed.   Angelia Mould, MSW, [place] [phone]

## 2012-02-04 NOTE — Progress Notes (Signed)
Pt continuing to remove ATC. Pt discovered sideways in bed with ATC behind her bottom. Pt's O2 sat remained around 94% on RA. RT titrated FiO2 to 28%. Pt tolerating changes at this time. RT will continue to monitor.

## 2012-02-05 LAB — GLUCOSE, CAPILLARY
Glucose-Capillary: 136 mg/dL — ABNORMAL HIGH (ref 70–99)
Glucose-Capillary: 136 mg/dL — ABNORMAL HIGH (ref 70–99)
Glucose-Capillary: 172 mg/dL — ABNORMAL HIGH (ref 70–99)
Glucose-Capillary: 122 mg/dL — ABNORMAL HIGH (ref 70–99)
Glucose-Capillary: 170 mg/dL — ABNORMAL HIGH (ref 70–99)

## 2012-02-05 LAB — CULTURE, RESPIRATORY W GRAM STAIN

## 2012-02-05 LAB — BASIC METABOLIC PANEL
CO2: 35 mEq/L — ABNORMAL HIGH (ref 19–32)
Calcium: 9.8 mg/dL (ref 8.4–10.5)
Chloride: 112 mEq/L (ref 96–112)
Creatinine, Ser: 1 mg/dL (ref 0.50–1.10)
GFR calc Af Amer: 68 mL/min — ABNORMAL LOW (ref >90)
Sodium: 153 mEq/L — ABNORMAL HIGH (ref 135–145)

## 2012-02-05 MED ORDER — FREE WATER
100.0000 mL | Freq: Four times a day (QID) | Status: DC
  Administered 2012-02-05 – 2012-02-06 (×3): 100 mL

## 2012-02-05 MED ORDER — DEXTROSE 5 % IV SOLN
INTRAVENOUS | Status: AC
  Administered 2012-02-05: 17:00:00 via INTRAVENOUS

## 2012-02-05 MED ORDER — CLONAZEPAM 1 MG PO TABS: 1.0000 mg | ORAL_TABLET | Freq: Every day | ORAL | Status: DC

## 2012-02-05 MED ORDER — RISPERIDONE 0.5 MG PO TABS
0.5000 mg | ORAL_TABLET | Freq: Every day | ORAL | Status: DC
  Administered 2012-02-05: 0.5 mg via ORAL
  Filled 2012-02-05 (×2): qty 1

## 2012-02-05 MED ORDER — DEXTROSE 5 % IV BOLUS
1000.0000 mL | Freq: Once | INTRAVENOUS | Status: AC
  Administered 2012-02-05: 1000 mL via INTRAVENOUS

## 2012-02-05 NOTE — Consult Note (Signed)
ANTIBIOTIC CONSULT NOTE - Follow Up  Pharmacy Consult for Unasyn Indication: Fever, tachycardia s/p complicated infectious course  Allergies  Allergen Reactions  . Nsaids     Patient Measurements: Height: 5\' 5"  (165.1 cm) Weight: 181 lb 7 oz (82.3 kg) IBW/kg (Calculated) : 57   Vital Signs: Temp: 98.6 F (37 C) (04/17 1117) Temp src: Oral (04/17 1117) BP: 134/54 mmHg (04/17 1200) Pulse Rate: 86  (04/17 1200) Intake/Output from previous day: 04/16 0701 - 04/17 0700 In: 2280.3 [I.V.:490.3; NG/GT:1590; IV Piggyback:200] Out: 1500 [Urine:1300; Stool:200] Intake/Output from this shift: Total I/O In: 605 [I.V.:100; NG/GT:505] Out: 500 [Urine:500]  Labs:  [facility] 02/04/12 0530 02/03/12 0403  WBC 16.7* 18.6*  HGB 9.2* 9.4*  PLT 347 364  LABCREA -- --  CREATININE 1.10 1.07   Medical History: Past Medical History  Diagnosis Date  . Asthma    Assessment: Ms. Mcduffey is a 61 year old woman s/p complicated course of abx with pneumococcal bacteremia.  She is afebrile and WBC has started to trend down.  Culture data as noted below:  C.Diff negative per PRC Bld. Clt. X 2 - NGTD Trach. Asp. - Candida Albicans  Plan:  1. Unasyn 3 grams IV q6h for r/o bacteremia dose 2. F/U WBC, fever, Cx results  Note:  Hypernatremia - would she benefit from free water?  Nadara Mustard [place], PharmD 02/05/2012,1:04 PM [phone]

## 2012-02-05 NOTE — Progress Notes (Signed)
Clinical Social Worker phoned [facility] who said they would review pt's paperwork again.  CSW submitted updated information.  CSW to continue to follow and assist as needed.   Angelia Mould, MSW, [place] [phone]

## 2012-02-05 NOTE — Progress Notes (Signed)
Speech Pathology  PMSV (Passy-Muir Speaking Valve) Treatment Note  I. Tracheostomy Tube  Trach collar period:  Continuous  Type: shiley    Size: 6.0     FiO2: 28% Cuff: yes Fenestration:  no Secretion description: minimal Frequency of tracheal suctioning:unknown Level of secretion expectoration: tracheal  II. Ventilator Dependency  no   III. Cuff Deflation Trials : cuff deflated prior to initiation of treatment and patient tolerating without noted difficulty. All vitals WFL   IV. PMSV Trial PMSV was placed for 5 minutes. Able to redirect subglottic air through upper airway: yes Able to attain phonation with PMSV: yes Able to expectorate secretions with PMSV: no Breath Support for phonation: poor Intelligibiity: < 25% RR:WFL SpO2: WFL HR:WFL Behavior: poor awareness, attention, confusion  V. Clinical Impression:  Patient seen for f/u differential diagnostic treatment for ability to resume PMSV use and pos. Patient remains with very poor mentation, restless, decreased sustained attention, confused. Valve placed for brief periods of time, maximum 5 minutes without changes in vital signs but with poor ability to direct air through upper airway as characterized by limited words per breath (1-2), very hoarse strained vocal quality, and loud expiratory wheezing. Subtle CO2 retention/breath stacking noted upon valve removal in 1/3 trials. Suspect that tracheomalacia is primary impacting factor on inability to achieve adequate phonation at this time. Downsizing trach may help but ability to fully tolerate PMSV for all walking hours guarded. Additionally, would consider repeat swallow evaluation given inability to tolerate previously recommended diet however at this time, mentation likely to further impact aspiration risk. Discussed with Dr. Vassie Loll who will address potential trach downsize. Will continue to f/u at bedside. Regardless of PMSV tolerance, as long as mentation continues to improve, will  plan for repeat objective testing (MBS this time due to significant restlessness) on Friday 4/19 to re-eval swallow and determine need for longer term non-oral means of nutrition.   VII. Recommendations MD- Consider changing tracheostomy tube to a smaller size, cuffless when appropriate 1. Patient to use PMSV with SLP only.   VIII. Treatment Plan   Continue current treatment plan  Pain: no   Ferdinand Lango MA, CCC-SLP [phone]

## 2012-02-05 NOTE — Progress Notes (Signed)
02/05/2012  2000  Pt had not voided since foley D/d at 12 noon.  Bladder scan showed 385  In and out cath done when pt unable to void. 600 ml obtained. Felis Quillin, Linnell Fulling

## 2012-02-05 NOTE — Progress Notes (Signed)
Emily Harrington is a 61 y.o. female admitted on 01/08/2012 with Pneumococcal bacteremia, PEA arrest.  Transitioned to SDU 3/23.  Developed increasing dyspnea, and change in mental status and transfer back to ICU 3/30. Tracheostomy on 4/9 & off vent since 4/13  Line/tube: (Truncated 4/8) ETT 3/30>>4/4 ETT 4/4>>4/9 Lt IJ CVL 3/30>>4/8 Rt PICC 4/8>> Trach (DF) 4/9>>  Cultures: (Truncated 4/8) Blood 3/20>>Pneumococcus Sputum 3/20>>E coli BAL 4/1>>Candida C diff 4/2>>negative Blood 4/14 >>ng resp 4/14 >>candida   Antibiotics: (Truncated 4/8) Zosyn 3/30>>4/8 Vancomycin 3/30>>4/3 unasyn 4/14 (? 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 rate 15, height 5\' 5"  (1.651 m), weight 82.3 kg (181 lb 7 oz), SpO2 100.00%. Wt Readings from Last 3 Encounters:  02/04/12 82.3 kg (181 lb 7 oz)   Body mass index is 30.19 kg/(m^2).  I/O last 3 completed shifts: In: 3165.3 [I.V.:690.3; NG/GT:2075; IV Piggyback:400] Out: 2200 [Urine:2000; Stool:200]  Vent Mode:  [-]  FiO2 (%):  [28 %] 28 %  Physical Exam: General - mild resp distress HEENT - Trach site clean Cardiac - s1s2  irregular,tachy,  no murmur Chest - scattered rhonchi, no wheeze Abd - soft, nontender, + bowel sounds GU - foley in place Ext - no edema Neuro - follows commands  CBC    Component Value Date/Time   WBC 16.7* 02/04/2012 0530   RBC 3.26* 02/04/2012 0530   HGB 9.2* 02/04/2012 0530   HCT 30.2* 02/04/2012 0530   PLT 347 02/04/2012 0530   MCV 92.6 02/04/2012 0530   MCH 28.2 02/04/2012 0530   MCHC 30.5 02/04/2012 0530   RDW 17.8* 02/04/2012 0530   LYMPHSABS 1.0 01/11/2012 0340   MONOABS 0.7 01/11/2012 0340   EOSABS 0.0 01/11/2012 0340   BASOSABS 0.0 01/11/2012 0340    BMET    Component Value Date/Time   NA 153* 02/04/2012 0530   K 3.8 02/04/2012 0530   CL 113* 02/04/2012 0530   CO2 33* 02/04/2012 0530   GLUCOSE 118* 02/04/2012 0530   BUN 16 02/04/2012 0530   CREATININE 1.10 02/04/2012 0530   CALCIUM 9.8 02/04/2012 0530   GFRNONAA 53* 02/04/2012 0530   GFRAA 61* 02/04/2012 0530    Dg Abd Portable 1v  02/03/2012  *RADIOLOGY REPORT*  Clinical Data: Feeding tube placement.  PORTABLE ABDOMEN - 1 VIEW  Comparison: Single view of the abdomen 02/02/2012.  Findings: Panda tube is in place with the tip in the upper body of the stomach.  Large right renal stone as described on prior exams noted.  IMPRESSION: Tip of the patient's feeding tube is in the upper body of the stomach.  Original Report Authenticated By: Bernadene Bell. D'ALESSIO, M.D.    ASSESSMENT/PLAN:  Acute respiratory failure initially from Pneumococcal PNA/bacteremia, and E coli PNA.  Multiple rib  fx after CPR.  Now with tracheomalacia limiting ability to proceed with extubation, thus requiring trach.  Has hx of asthma. - trach collar as tolerated -prn bronchodilators -fitted with PM valve 4/11 but not progressing  ID --Unexplained fever, tachy 4/14 -Empiric unasyn & po flagyl ,  cx neg , stool c diff not sent - rechk pct & simplify  Dysphagia -Swallow eval done 4/11>>Dysphagia 1 puree with honey thick liquid -speech therapy following -  downsize trach at some point to $ cuffless  Acute renal failure >> resolved  Protein calorie malnutrition -continue tube feeds until able to take adequate oral feedings  Agitated delirium -decrease klonopin to off, add risperdal -d/c versed and precedex -prn ativan, fentanyl  HTN, tachycardia -continue catapres -Increase metoprolol 75 bid, use IV prn   Hypokalemia -replace and f/u BMET  Hyperglycemia -SSI  Candidal dermatitis -nystatin powder  Anemia of chronic disease and critical illness -f/u CBC intermittently -transfuse for Hb < 7  Best practice -lovenox for DVT proph -protonix for SUP  Disposition -OOB - PT -foley, rectal tube per nursing - vent-SDU bed -Will ask case manager to assess for LTAC/ snf placement>>insurance & panda an issue Updated daughter in detail 4/17  [facility] [phone]. [phone] 02/05/2012, 11:14 AM

## 2012-02-06 LAB — BASIC METABOLIC PANEL
CO2: 31 mEq/L (ref 19–32)
Calcium: 8.9 mg/dL (ref 8.4–10.5)
Creatinine, Ser: 0.89 mg/dL (ref 0.50–1.10)
GFR calc non Af Amer: 68 mL/min — ABNORMAL LOW (ref >90)
Glucose, Bld: 145 mg/dL — ABNORMAL HIGH (ref 70–99)
Sodium: 149 mEq/L — ABNORMAL HIGH (ref 135–145)

## 2012-02-06 LAB — CBC
MCH: 28.6 pg (ref 26.0–34.0)
MCV: 92.5 fL (ref 78.0–100.0)
Platelets: 287 10*3/uL (ref 150–400)
RDW: 18.6 % — ABNORMAL HIGH (ref 11.5–15.5)

## 2012-02-06 LAB — GLUCOSE, CAPILLARY: Glucose-Capillary: 159 mg/dL — ABNORMAL HIGH (ref 70–99)

## 2012-02-06 LAB — PROCALCITONIN: Procalcitonin: 0.14 ng/mL

## 2012-02-06 MED ORDER — RISPERIDONE 1 MG PO TABS
1.0000 mg | ORAL_TABLET | Freq: Every day | ORAL | Status: DC
  Administered 2012-02-07 – 2012-02-14 (×8): 1 mg via ORAL
  Filled 2012-02-06 (×9): qty 1

## 2012-02-06 MED ORDER — POTASSIUM CHLORIDE 20 MEQ/15ML (10%) PO LIQD
ORAL | Status: AC
  Administered 2012-02-06 (×2): 40 meq
  Filled 2012-02-06: qty 30

## 2012-02-06 MED ORDER — CLONAZEPAM 0.5 MG PO TABS
0.5000 mg | ORAL_TABLET | Freq: Every day | ORAL | Status: DC
  Administered 2012-02-07: 0.5 mg
  Filled 2012-02-06: qty 1

## 2012-02-06 MED ORDER — POTASSIUM CHLORIDE 20 MEQ/15ML (10%) PO LIQD
40.0000 meq | Freq: Once | ORAL | Status: AC
  Administered 2012-02-06: 40 meq
  Filled 2012-02-06: qty 30

## 2012-02-06 MED ORDER — FREE WATER
200.0000 mL | Freq: Three times a day (TID) | Status: DC
  Administered 2012-02-06 (×2): 200 mL

## 2012-02-06 NOTE — Progress Notes (Signed)
Physical Therapy Treatment/Re-eval Patient Details Name: Emily Harrington MRN: [id number] DOB: April 18, 1951 Today's Date: 02/06/2012  PT Assessment/Plan  PT - Assessment/Plan Comments on Treatment Session: 61 y.o. female admitted to [facility] for admitted on 01/08/2012 with Pneumococcal bacteremia, PEA arrest.  Transitioned to SDU 3/23.  Developed increasing dyspnea, and change in mental status and transfer back to ICU 3/30.  The patient is progressing well with mobility, athough there is increased WOB with acitivity.  Her O2 sats remain high.  We were able to obtain a portable monitor for her today and make a portable trach attachment for her to be able to walk further.  She continues to have significant cognitive deficits, but is very willing to get up and move around.  We should be able to continue to progress gait and mobility.  Goals updated and reviewed today.  Will review again in 2 weeks.   PT Plan: Discharge plan remains appropriate;Frequency remains appropriate PT Frequency: Min 3X/week Follow Up Recommendations: Skilled nursing facility Equipment Recommended: Defer to next venue PT Goals  Acute Rehab PT Goals PT Goal Formulation: Patient unable to participate in goal setting Time For Goal Achievement: 2 weeks Pt will go Supine/Side to Sit: with modified independence;with rail PT Goal: Supine/Side to Sit - Progress: Updated due to goal met Pt will Sit at [facility] of Bed: with supervision;with no upper extremity support;6-10 min Pt will go Sit to Supine/Side: with supervision PT Goal: Sit to Supine/Side - Progress: Goal set today Pt will go Sit to Stand: with min assist;with upper extremity assist PT Goal: Sit to Stand - Progress: Progressing toward goal Pt will go Stand to Sit: with min assist;with upper extremity assist PT Goal: Stand to Sit - Progress: Progressing toward goal Pt will Transfer Bed to Chair/Chair to Bed: with min assist Pt will Stand: with min assist;3 - 5 min;with bilateral  upper extremity support Pt will Ambulate: with min assist;16 - 50 feet;with rolling walker PT Goal: Ambulate - Progress: Updated due to goal met Pt will Perform Home Exercise Program: with min assist  PT Treatment Precautions/Restrictions  Precautions Precautions: Fall Precaution Comments: Pt with multiple lines. Required Braces or Orthoses DO NOT USE: No Restrictions Weight Bearing Restrictions: No Mobility (including Balance) Bed Mobility Bed Mobility: Yes Supine to Sit: 5: Supervision Supine to Sit Details (indicate cue type and reason): supervision for safety secondary to impulsivity and to protect lines Sitting - Scoot to Edge of Bed: 5: Supervision Sitting - Scoot to Edge of Bed Details (indicate cue type and reason): supervision for safety to make sure patient did not try to stand or slide off of the bed prematurely, seat deflated on air bed.   Transfers Sit to Stand: 1: +2 Total assist;Patient percentage (comment);From bed (70) Sit to Stand Details (indicate cue type and reason): patient 70% from elevated bed with one arm pulling on OT and one hand placed manually on RW.  Support needed at trunk.   Stand to Sit: 1: +2 Total assist;To chair/3-in-1 (50) Stand to Sit Details: the patient needed a great deal of assist to sit because she was trying to sit in a chair that was not appropriate for her (straight back room chair), even with cues to sit in the recliner the patient was trying to turn and sit in the other chair, so quite a bit of assist needed to flex hips and get patient to sit down, she had a startle reflex as if she were falling and would not let go  of the RW during the transition.   Ambulation/Gait Ambulation/Gait: Yes Ambulation/Gait Assistance: 1: +2 Total assist;Patient percentage (comment) Ambulation/Gait Assistance Details (indicate cue type and reason): patient 70% with RW, significant assist needed to steer RW and support trunk for balance.   Ambulation Distance  (Feet): 10 Feet Assistive device: Rolling walker Gait Pattern: Step-to pattern;Shuffle;Trunk flexed    End of Session PT - End of Session Equipment Utilized During Treatment: Gait belt (RW, trach attachment for O2, 5L O2  Bend for 28% trach collar) Activity Tolerance: Patient limited by fatigue;Treatment limited secondary to medical complications (Comment) (increased RR during gait despite O2 sats mid to upper 90s) Patient left: in chair;with call bell in reach;with restraints reapplied (mittens, sitter in room) General Behavior During Session: Restless Cognition: Impaired Cognitive Impairment: decreased awareness of deficits, decreased safety awareness, decreased processing, decreased sustained attention.    Rollene Rotunda Mizani Dilday, PT, DPT [phone] 02/06/2012, 12:49 PM

## 2012-02-06 NOTE — Procedures (Signed)
Tracheostomy Change Note  Patient Details:   Name: Emily Harrington DOB: 03/06/1951 MRN: [id number]    Airway Documentation:     Evaluation  O2 sats: stable throughout Complications: No apparent complications Patient did tolerate procedure well. Bilateral Breath Sounds: Diminished Suctioning: Airway  Clearance Coots 02/06/2012, 3:07 PM  Patient had #6cuffed trach in.  Removed sutures and changed out trach. Replaced trach with #4 cuffless. Pt tol procedure well.

## 2012-02-06 NOTE — Evaluation (Signed)
Occupational Therapy Evaluation Patient Details Name: Emily Harrington MRN: [id number] DOB: 12-Oct-1951 Today's Date: 02/06/2012  Problem List:  Patient Active Problem List  Diagnoses  . Septic shock  . Acute respiratory failure with hypoxia  . Cardiac arrest  . Right upper lobe pneumonia  . Acute renal failure with tubular necrosis  . Asthma exacerbation  . Pleuritic chest pain  . E. coli pneumonia  . Bacteremia due to Streptococcus pneumoniae  . HTN (hypertension)  . Avascular necrosis of femur head, left  . Multiple rib fractures after CPR  . Sternal fracture after CPR  . Tracheomalacia    Past Medical History:  Past Medical History  Diagnosis Date  . Asthma    Past Surgical History:  Past Surgical History  Procedure Date  . Cholecystectomy   . Abdominal hysterectomy     OT Assessment/Plan/Recommendation OT Assessment Clinical Impression Statement: 61 y.o. female admitted on 01/08/2012 with Pneumococcal bacteremia, PEA arrest. Transitioned to SDU 3/23. Developed increasing dyspnea, and change in mental status and transfer back to ICU 3/30.  Tracheostomy on 4/9 & off vent since 4/13.  Pt is dependent in all aspects of self care with poor attention and cognition.  Will follow acutely.  Will need SNF upon d/c.  OT Recommendation/Assessment: Patient will need skilled OT in the acute care venue OT Problem List: Decreased activity tolerance;Impaired balance (sitting and/or standing);Decreased cognition;Decreased safety awareness;Decreased knowledge of use of DME or AE;Cardiopulmonary status limiting activity Barriers to Discharge: Decreased caregiver support OT Therapy Diagnosis : Generalized weakness;Cognitive deficits OT Plan OT Frequency: Min 1X/week OT Treatment/Interventions: Self-care/ADL training;Cognitive remediation/compensation;Patient/family education;Therapeutic activities OT Recommendation Follow Up Recommendations: Skilled nursing facility Equipment  Recommended: Defer to next venue Individuals Consulted Consulted and Agree with Results and Recommendations: Patient unable/family or caregiver not available OT Goals Acute Rehab OT Goals OT Goal Formulation: Patient unable to participate in goal setting Time For Goal Achievement: 2 weeks ADL Goals Pt Will Perform Grooming: with min assist;Sitting, chair ADL Goal: Grooming - Progress: Goal set today Pt Will Perform Upper Body Dressing: Sitting, bed;with min assist ADL Goal: Upper Body Dressing - Progress: Goal set today Pt Will Transfer to Toilet: with min assist;3-in-1;Ambulation ADL Goal: Toilet Transfer - Progress: Goal set today Miscellaneous OT Goals Miscellaneous OT Goal #1: Pt will follow simple one step commands with 80 % accuracy. OT Goal: Miscellaneous Goal #1 - Progress: Goal set today  OT Evaluation Precautions/Restrictions  Precautions Precautions: Fall Precaution Comments: Pt with multiple lines. Required Braces or Orthoses DO NOT USE: No Restrictions Weight Bearing Restrictions: No Prior Functioning Home Living Lives With: Alone Type of Home: House Home Access: Stairs to enter Secretary/administrator of Steps: 3 Entrance Stairs-Rails: Right Home Layout: One level Bathroom Shower/Tub: Engineer, manufacturing systems: Standard Home Adaptive Equipment: None Prior Function Level of Independence: Independent Driving: Yes Vocation: Retired  ADL ADL Eating/Feeding: NPO Grooming: Simulated;Maximal assistance Where Assessed - Grooming: Sitting, chair Upper Body Bathing: Simulated;Maximal assistance Where Assessed - Upper Body Bathing: Sitting, bed Lower Body Bathing: Simulated;+1 Total assistance Where Assessed - Lower Body Bathing: Sitting, bed Upper Body Dressing: Performed;+1 Total assistance Where Assessed - Upper Body Dressing: Sitting, bed Lower Body Dressing: Performed;+1 Total assistance Where Assessed - Lower Body Dressing: Sitting, bed;Sit to stand  from bed Toilet Transfer: Simulated;+2 Total assistance;Comment for patient % (70) Toilet Transfer Details (indicate cue type and reason): to Surveyor, minerals Method: Ambulating Equipment Used: Rolling walker Ambulation Related to ADLs: +2 total assist pt70% ADL Comments: Pt  very impulsive with poor attention and ability to follow commands.  Decreased safety awareness.  Poor endurance for activity. Cognition Cognition Overall Cognitive Status: Impaired Area of Impairment: Attention;Following commands;Safety/judgement;Awareness of errors;Awareness of deficits Arousal/Alertness: Awake/alert Behavior During Session: Restless Current Attention Level: Focused (impulsive) Following Commands: Follows one step commands inconsistently;Follows multi-step commands inconsistently Safety/Judgement: Decreased awareness of safety precautions;Decreased safety judgement for tasks assessed Extremity Assessment RUE Assessment RUE Assessment: Within Functional Limits LUE Assessment LUE Assessment: Within Functional Limits Mobility  Bed Mobility Bed Mobility: Yes Supine to Sit: 5: Supervision Sitting - Scoot to Edge of Bed: 5: Supervision Transfers Transfers: Yes Sit to Stand: 1: +2 Total assist;Patient percentage (comment);From bed (70) Stand to Sit: 1: +2 Total assist;To chair/3-in-1 (50) End of Session OT - End of Session Equipment Utilized During Treatment: Gait belt Activity Tolerance: Patient limited by fatigue Patient left: in chair;with call bell in reach;Other (comment);with restraints reapplied (mitts applied, sitter present) General Behavior During Session: Restless Cognition: Impaired Cognitive Impairment: decr awareness of limitations   Evern Bio 02/06/2012, 11:27 AM  [phone]

## 2012-02-06 NOTE — Progress Notes (Signed)
Speech Pathology  PMSV (Passy-Muir Speaking Valve) Treatment Note  I. Tracheostomy Tube  Trach collar period: 24 hrs/day Type: shiley     Size: 6      FiO2: 28%  Cuff: yes  Fenestration: no  Secretion description: minimal  Frequency of tracheal suctioning: with respiratory therapy only 2-3x a day  Level of secretion expectoration: tracheal  II. Ventilator Dependency  no   III. Cuff Deflation Trials  yes for 24  day  IV. PMSV Trial PMSV was placed for 15 minutes. Able to redirect subglottic air through upper airway: minimally yes Able to attain phonation with PMSV: minimally yes Able to expectorate secretions with PMSV: no Breath Support for phonation: Poor Intelligibiity: <25% RR: 20-37 SpO2: 100% HR:WFL  Behavior: anxious, restless, focused attention, does not follow all commands  V. Clinical Impression:  Pt without any improvement of PMSV this session. Minimal redirection of air to upper airway for phonation, mostly speaking a a whisper. Respiratory rate intermittently jumping to high 30's during placement of valve. No overt CO2 trapping observed. Previous SLP has documented tracheomalacia impacting ability to redirect air to upper airway with deflated cuff also impeding airflow. Await MD for progression of trach downsize. Pts mentation shows no improvement. Focused attention and restless behavior will impair safety with POs. Will follow for possible repeat swallow eval Friday if trach downsized.   VII. Recommendations MD- Consider changing tracheostomy tube to a 4 cuffless 1. Patient to use PMSV with SLP only 2.consider repeat swallow eval friday 3.  VIII. Treatment Plan  Continue current treatment plan Pain: no Harlon Ditty, MA CCC-SLP [phone]

## 2012-02-06 NOTE — Progress Notes (Signed)
Clinical Social Worker submitted requested information to [employer].  CSW to continue to follow and assist as needed.  Angelia Mould, MSW, [place] [phone]

## 2012-02-06 NOTE — Progress Notes (Signed)
Emily Harrington is a 61 y.o. female admitted on 01/08/2012 with Pneumococcal bacteremia, PEA arrest.  Transitioned to SDU 3/23.  Developed increasing dyspnea, and change in mental status and transfer back to ICU 3/30. Tracheostomy on 4/9 & off vent since 4/13  Line/tube: (Truncated 4/8) ETT 3/30>>4/4 ETT 4/4>>4/9 Lt IJ CVL 3/30>>4/8 Rt PICC 4/8>> Trach (DF) 4/9>>  Cultures: (Truncated 4/8) Blood 3/20>>Pneumococcus Sputum 3/20>>E coli BAL 4/1>>Candida C diff 4/2>>negative Blood 4/14 >>ng resp 4/14 >>candida   Antibiotics: (Truncated 4/8) Zosyn 3/30>>4/8 Vancomycin 3/30>>4/3 unasyn 4/14 (? Aspn, fever, wc) >>4/19 (planned) Flagyl 4/14 (c.diff ) > 4/21  Tests/events: 3/20 Echo>>EF 55 to 60%, mild LVH 3/20 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 3/20 CT abd/pelvis>>Large Rt renal pelvis stone, avascular necrosis Lt femoral head 3/22 EEG>>non-specific slow rhythm, no epileptiform activity 3/27 Renal u/s>>Large Rt renal pelvis stone w/o hydronephrosis 3/29 CT chest>>decrease RUL ASD, patchy LUL ASD/ATX, b/l effusions, multiple rib fx's and probable sternal fx 3/29 Doppler legs>>no DVT in either Rt or Lt legs 4/1 Bronchoscopy>>atypical cells in cytology from RUL 4/4 Bronchoscopy>>severe tracheomalacia 4/9 Bronchoscopy>>BAL cytology RUL negative 4/13 RVR  SUBJECTIVE: On  TC , secretions mild,  oob to chair, , intermittent confusion, ambulated with PT Denies pain  OBJECTIVE:  Blood pressure 125/35, pulse 123, temperature 98.3 F (36.8 C), temperature source Oral, resp. rate 28, height 5\' 5"  (1.651 m), weight 83.2 kg (183 lb 6.8 oz), SpO2 100.00%. Wt Readings from Last 3 Encounters:  02/06/12 83.2 kg (183 lb 6.8 oz)   Body mass index is 30.52 kg/(m^2).  I/O last 3 completed shifts: In: 4175.3 [I.V.:1150.3; NG/GT:2625; IV Piggyback:400] Out: 2475 [Urine:2325; Stool:150]  Vent Mode:  [-]  FiO2 (%):  [28 %] 28 %  Physical Exam: General - mild resp distress HEENT - Trach site clean Cardiac  - s1s2 irregular,tachy,  no murmur Chest - scattered rhonchi, no wheeze Abd - soft, nontender, + bowel sounds GU - foley in place Ext - no edema Neuro - follows commands, interactive  CBC    Component Value Date/Time   WBC 13.7* 02/06/2012 0350   RBC 2.94* 02/06/2012 0350   HGB 8.4* 02/06/2012 0350   HCT 27.2* 02/06/2012 0350   PLT 287 02/06/2012 0350   MCV 92.5 02/06/2012 0350   MCH 28.6 02/06/2012 0350   MCHC 30.9 02/06/2012 0350   RDW 18.6* 02/06/2012 0350   LYMPHSABS 1.0 01/11/2012 0340   MONOABS 0.7 01/11/2012 0340   EOSABS 0.0 01/11/2012 0340   BASOSABS 0.0 01/11/2012 0340    BMET    Component Value Date/Time   NA 149* 02/06/2012 0350   K 3.0* 02/06/2012 0350   CL 107 02/06/2012 0350   CO2 31 02/06/2012 0350   GLUCOSE 145* 02/06/2012 0350   BUN 11 02/06/2012 0350   CREATININE 0.89 02/06/2012 0350   CALCIUM 8.9 02/06/2012 0350   GFRNONAA 68* 02/06/2012 0350   GFRAA 79* 02/06/2012 0350    No results found.  ASSESSMENT/PLAN:  Acute respiratory failure initially from Pneumococcal PNA/bacteremia, and E coli PNA.  Multiple rib fx after CPR.  Now with tracheomalacia limiting ability to proceed with extubation, thus requiring trach.  Has hx of asthma. - trach collar as tolerated, downsize to 4 cuffless -prn bronchodilators -fitted with PM valve 4/11 but not progressing  ID --Unexplained fever, tachy 4/14 -Empiric unasyn & po flagyl ,  cx neg , stool c diff not sent  -rpt pct 0.14 , dc unasyn 4/19 & dc flagyl x 7ds  Dysphagia -Swallow eval done  4/11>>Dysphagia 1 puree with honey thick liquid -speech therapy following - downsize trach to 4 cuffless  Acute renal failure >> resolved  Protein calorie malnutrition -continue tube feeds until able to take adequate oral feedings  Agitated delirium -decrease klonopin to off, increase  risperdal to 1 mg -Off versed and precedex -prn fentanyl  HTN, tachycardia -continue catapres -Increased metoprolol 75 bid, use IV prn    Hypokalemia -replace and f/u BMET  Hyperglycemia -SSI  Candidal dermatitis -nystatin powder  Anemia of chronic disease and critical illness -f/u CBC intermittently -transfuse for Hb < 7  Best practice -lovenox for DVT proph -protonix for SUP  Disposition -OOB - PT -foley , rectal tube  Out per nursing - vent-SDU bed -Will ask case manager to assess for LTAC/ snf placement>>insurance & panda an issue Updated daughter in detail 4/17  [facility] V. [phone] 02/06/2012, 10:41 AM

## 2012-02-07 ENCOUNTER — Inpatient Hospital Stay (HOSPITAL_COMMUNITY): Payer: Medicaid Other

## 2012-02-07 DIAGNOSIS — G9340 Encephalopathy, unspecified: Secondary | ICD-10-CM | POA: Diagnosis not present

## 2012-02-07 LAB — BASIC METABOLIC PANEL
BUN: 11 mg/dL (ref 6–23)
Chloride: 108 mEq/L (ref 96–112)
GFR calc Af Amer: 83 mL/min — ABNORMAL LOW (ref >90)
GFR calc non Af Amer: 72 mL/min — ABNORMAL LOW (ref >90)
Glucose, Bld: 150 mg/dL — ABNORMAL HIGH (ref 70–99)
Potassium: 4.2 mEq/L (ref 3.5–5.1)
Sodium: 145 mEq/L (ref 135–145)

## 2012-02-07 LAB — GLUCOSE, CAPILLARY
Glucose-Capillary: 127 mg/dL — ABNORMAL HIGH (ref 70–99)
Glucose-Capillary: 175 mg/dL — ABNORMAL HIGH (ref 70–99)
Glucose-Capillary: 165 mg/dL — ABNORMAL HIGH (ref 70–99)
Glucose-Capillary: 84 mg/dL (ref 70–99)
Glucose-Capillary: 107 mg/dL — ABNORMAL HIGH (ref 70–99)

## 2012-02-07 MED ORDER — METOPROLOL TARTRATE 1 MG/ML IV SOLN
5.0000 mg | Freq: Four times a day (QID) | INTRAVENOUS | Status: DC
  Administered 2012-02-08 – 2012-02-14 (×25): 5 mg via INTRAVENOUS
  Filled 2012-02-07 (×30): qty 5

## 2012-02-07 MED ORDER — METRONIDAZOLE IN NACL 5-0.79 MG/ML-% IV SOLN
500.0000 mg | Freq: Three times a day (TID) | INTRAVENOUS | Status: DC
  Administered 2012-02-07 – 2012-02-11 (×11): 500 mg via INTRAVENOUS
  Filled 2012-02-07 (×16): qty 100

## 2012-02-07 MED ORDER — PANTOPRAZOLE SODIUM 40 MG IV SOLR
40.0000 mg | INTRAVENOUS | Status: DC
  Administered 2012-02-07 – 2012-02-09 (×3): 40 mg via INTRAVENOUS
  Filled 2012-02-07 (×5): qty 40

## 2012-02-07 MED ORDER — LORAZEPAM 2 MG/ML IJ SOLN
1.0000 mg | Freq: Once | INTRAMUSCULAR | Status: AC
  Administered 2012-02-07: 1 mg via INTRAVENOUS
  Filled 2012-02-07: qty 1

## 2012-02-07 MED ORDER — FENTANYL CITRATE 0.05 MG/ML IJ SOLN: 25.0000 ug | INTRAMUSCULAR | Status: DC | PRN

## 2012-02-07 MED ORDER — CLONIDINE HCL 0.1 MG/24HR TD PTWK
0.1000 mg | MEDICATED_PATCH | TRANSDERMAL | Status: DC
  Administered 2012-02-07: 0.1 mg via TRANSDERMAL
  Filled 2012-02-07 (×2): qty 1

## 2012-02-07 NOTE — Progress Notes (Signed)
PT Cancellation Note  Treatment cancelled today due to pt sleeping and cannot stay awake more than a few seconds.  Attempted to see in a.m. and p.m. Sitter reported pt "has not slept for days and now she finally is.".  Ilda Laskin 02/07/2012, 3:37 PM Pager [phone]

## 2012-02-07 NOTE — Progress Notes (Signed)
Clinical Child psychotherapist received a phone call from [facility].  They are actively reviewing pt for potential placement at their facility.  Facility requested a sample "Letter of Guarantee" for their corporate office to review.  CSW submitted requested document and will follow up with SNF after they have reviewed.     Angelia Mould, MSW, [place] [phone]

## 2012-02-07 NOTE — Progress Notes (Signed)
Emily Harrington is a 61 y.o. female admitted on 01/08/2012 with Pneumococcal bacteremia, PEA arrest.  Transitioned to SDU 3/23.  Developed increasing dyspnea, and change in mental status and transfer back to ICU 3/30. Tracheostomy on 4/9 & off vent since 4/13  Line/tube: (Truncated 4/8) ETT 3/30>>4/4 ETT 4/4>>4/9 Lt IJ CVL 3/30>>4/8 Rt PICC 4/8>> Trach (DF) 4/9>>  Cultures: (Truncated 4/8) Blood 3/20>>Pneumococcus Sputum 3/20>>E coli BAL 4/1>>Candida C diff 4/2>>negative Blood 4/14 >>ng resp 4/14 >>candida   Antibiotics: (Truncated 4/8) Zosyn 3/30>>4/8 Vancomycin 3/30>>4/3 unasyn 4/14 (? Aspn, fever, wc) >>4/19 (planned) Flagyl 4/14 (c.diff ) > 4/21  Tests/events: 3/20 Echo>>EF 55 to 60%, mild LVH 3/20 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 3/20 CT abd/pelvis>>Large Rt renal pelvis stone, avascular necrosis Lt femoral head 3/22 EEG>>non-specific slow rhythm, no epileptiform activity 3/27 Renal u/s>>Large Rt renal pelvis stone w/o hydronephrosis 3/29 CT chest>>decrease RUL ASD, patchy LUL ASD/ATX, b/l effusions, multiple rib fx's and probable sternal fx 3/29 Doppler legs>>no DVT in either Rt or Lt legs 4/1 Bronchoscopy>>atypical cells in cytology from RUL 4/4 Bronchoscopy>>severe tracheomalacia 4/9 Bronchoscopy>>BAL cytology RUL negative 4/13 RVR 4/18 downsize trach to cuffless 4  SUBJECTIVE: On  TC , secretions mild,  , intermittent confusion, ambulated with PT, cleared swallow Denies pain  OBJECTIVE:  Blood pressure 162/67, pulse 85, temperature 98.5 F (36.9 C), temperature source Oral, resp. rate 20, height 5\' 5"  (1.651 m), weight 81.7 kg (180 lb 1.9 oz), SpO2 95.00%. Wt Readings from Last 3 Encounters:  02/07/12 81.7 kg (180 lb 1.9 oz)   Body mass index is 29.97 kg/(m^2).  I/O last 3 completed shifts: In: 3905 [I.V.:500; Other:150; AV/WU:9811; IV Piggyback:600] Out: 1650 [Urine:1500; Stool:150]  Vent Mode:  [-]  FiO2 (%):  [28 %] 28 %  Physical Exam: General - no resp  distress, intermittent confusion HEENT - Trach site clean Cardiac - s1s2 irregular,tachy,  no murmur Chest - scattered rhonchi, no wheeze Abd - soft, nontender, + bowel sounds  Ext - no edema Neuro - follows commands, interactive  CBC    Component Value Date/Time   WBC 13.7* 02/06/2012 0350   RBC 2.94* 02/06/2012 0350   HGB 8.4* 02/06/2012 0350   HCT 27.2* 02/06/2012 0350   PLT 287 02/06/2012 0350   MCV 92.5 02/06/2012 0350   MCH 28.6 02/06/2012 0350   MCHC 30.9 02/06/2012 0350   RDW 18.6* 02/06/2012 0350   LYMPHSABS 1.0 01/11/2012 0340   MONOABS 0.7 01/11/2012 0340   EOSABS 0.0 01/11/2012 0340   BASOSABS 0.0 01/11/2012 0340    BMET    Component Value Date/Time   NA 145 02/07/2012 0459   K 4.2 02/07/2012 0459   CL 108 02/07/2012 0459   CO2 31 02/07/2012 0459   GLUCOSE 150* 02/07/2012 0459   BUN 11 02/07/2012 0459   CREATININE 0.85 02/07/2012 0459   CALCIUM 9.6 02/07/2012 0459   GFRNONAA 72* 02/07/2012 0459   GFRAA 83* 02/07/2012 0459    No results found.  ASSESSMENT/PLAN:  Acute respiratory failure initially from Pneumococcal PNA/bacteremia, and E coli PNA.  Multiple rib fx after CPR.  Now with tracheomalacia limiting ability to proceed with extubation, thus requiring trach.  Has hx of asthma. - trach collar as tolerated, downsized to 4 cuffless -prn bronchodilators -fitted with PM valve 4/11 , talking around trach now  ID --Unexplained fever, tachy 4/14 -Empiric unasyn & po flagyl ,  cx neg , stool c diff not sent  -rpt pct 0.14 , dc unasyn 4/19 & dc flagyl  4/21  Dysphagia -Swallow eval done 4/19>>Dysphagia 1 puree with honey thick liquid -dc panda  Acute renal failure >> resolved  Protein calorie malnutrition -advance PO  Agitated delirium/ anoxic encephalopathy - wonder if this is her new baseline -decrease klonopin to off, increase  risperdal to 1 mg - titrate up as needed -prn fentanyl  HTN, tachycardia -continue catapres -metoprolol 75 bid, use IV prn    Hypokalemia -replaced   Hyperglycemia -SSI  Candidal dermatitis -nystatin powder  Anemia of chronic disease and critical illness -f/u CBC intermittently -transfuse for Hb < 7  Best practice -lovenox for DVT proph -protonix for SUP  Disposition -OOB - PT -foley , rectal tube  Out per nursing -OK to transfer to tele -case manager to assess for  snf placement>>now that panda out Updated daughter in detail 4/17  [facility] V. [phone] 02/07/2012, 11:45 AM

## 2012-02-07 NOTE — Progress Notes (Signed)
Speech Language Pathology Treatment  Patient Details Name: [name] MRN: [id number] DOB: [birth year] Today's Date: 02/07/2012  Addendum to previous MBS note: SLP placed PMSV for MBS. Patient with high respiratory rate in the high 30s which occurred both at bedside and with PMSV in place. No other decline in vital signs noted including HR and O2. Vocal quality improved, remains with moderately decreased intensity however clear and intelligible now that trach downsized to #4.0 cuffless (additionally, leak speech noted prior to PMSV placement indicative of increased upper airway patency). Question if anxiety and restlessness plays a large part in increased RR given RN report that during sleep, no increased WOB noted.  Feel that patient may wear PMSV during waking hours as long as fully supervised by RN, SLP, or RT with close monitoring of vital signs. Patient may wear PMSV during po intake however no necessary.    Ferdinand Lango MA, CCC-SLP [phone]  Ferdinand Lango Meryl 02/07/2012, 3:27 PM

## 2012-02-07 NOTE — Procedures (Signed)
Objective Swallowing Evaluation:   MBS Patient Details  Name: Emily Harrington MRN: [id number] Date of Birth: [birth year]  Today's Date: 02/07/2012 Time:  -     Past Medical History:  Past Medical History  Diagnosis Date  . Asthma    Past Surgical History:  Past Surgical History  Procedure Date  . Cholecystectomy   . Abdominal hysterectomy     SLP Assessment/Plan Patient presents with a mild oral and moderate pharyngeal phase dysphagia with a secondary esophageal component. Decreased bolus cohesion secondary to SOB/increased respiratory rate result in premature spillage of the bolus with eventual delayed swallow initiation resulting in deep silent penetration of liquids thinner than honey thick which she is unable to clear with cued throat clear due to AMS. Very mild vallecular residuals, secondary to base of tongue weakness, and mild-mod pyriform sinus residuals, suspected secondary to presence of Panda tube causing complete clearance of bolus given strength which appears relatively intact, are noted post swallow. Although these residuals do increase aspiration risk, patient with eventual spontaneous dry swallow which effectively aids in clearance. In addition, suspect that removal of Panda tube with the initiation of a po diet will increase swallowing function and decrease aspiration risk. Recommend re-initiation of a dysphagia 1 (puree) diet with honey thick liquids via teaspoon as least restrictive diet.  PMV place and removed frequently during evaluation and did not appear to significantly impact overall swallowing function. Patient may wear PMSV during meals although not a requirement.  Recommendation/Prognosis  1. Dysphagia 1 (puree) with honey thick liquids 2. Small bites and sips 3. Liquids via tsp only 4. Slow rate of feeding to allow time for dry swallows 5. May wear PMSV during meals with full supervision however not a requirement 6. Full supervision.     SLP Goals   1.  Patient will consume recommended diet with no s/s of aspiration and mod assist 2. Patient will utilize recommended compensatory strategies and aspiration precautions with mod assist.   Ferdinand Lango MA, CCC-SLP [phone]   Ferdinand Lango Meryl 02/07/2012, 2:15 PM

## 2012-02-08 DIAGNOSIS — G9340 Encephalopathy, unspecified: Secondary | ICD-10-CM

## 2012-02-08 LAB — BASIC METABOLIC PANEL
Calcium: 10 mg/dL (ref 8.4–10.5)
Creatinine, Ser: 0.99 mg/dL (ref 0.50–1.10)
GFR calc Af Amer: 69 mL/min — ABNORMAL LOW (ref >90)
GFR calc non Af Amer: 60 mL/min — ABNORMAL LOW (ref >90)

## 2012-02-08 LAB — GLUCOSE, CAPILLARY: Glucose-Capillary: 145 mg/dL — ABNORMAL HIGH (ref 70–99)

## 2012-02-08 LAB — CULTURE, BLOOD (ROUTINE X 2)
Culture  Setup Time: 201304141555
Culture: NO GROWTH

## 2012-02-08 NOTE — Progress Notes (Signed)
Emily Harrington is a 61 y.o. female admitted on 01/08/2012 with Pneumococcal bacteremia, PEA arrest.  Transitioned to SDU 3/23.  Developed increasing dyspnea, and change in mental status and transfer back to ICU 3/30. Tracheostomy on 4/9 & off vent since 4/13  Line/tube: (Truncated 4/8) ETT 3/30>>4/4 ETT 4/4>>4/9 Lt IJ CVL 3/30>>4/8 Rt PICC 4/8>> Trach (DF) 4/9>>  Cultures: (Truncated 4/8) Blood 3/20>>Pneumococcus Sputum 3/20>>E coli BAL 4/1>>Candida C diff 4/2>>negative Blood 4/14 >>ng resp 4/14 >>candida   Antibiotics: (Truncated 4/8) Zosyn 3/30>>4/8 Vancomycin 3/30>>4/3 unasyn 4/14 (? 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  Only mild increased wob.  Not a lot of secretions.  Still confused.  OBJECTIVE:  Blood pressure 122/58, pulse 123, temperature 97.2 F (36.2 C), temperature source Oral, resp. rate 22, height 5\' 5"  (1.651 m), weight 80.7 kg (177 lb 14.6 oz), SpO2 95.00%. Wt Readings from Last 3 Encounters:  02/08/12 80.7 kg (177 lb 14.6 oz)   Body mass index is 29.61 kg/(m^2).  I/O last 3 completed shifts: In: 1645 [I.V.:270; NG/GT:1175; IV Piggyback:200] Out: 400 [Urine:300; Stool:100]  Vent Mode:  [-]  FiO2 (%):  [28 %-35 %] 35 %  Physical Exam: General - no  resp distress, + confusion HEENT - Trach site clean Cardiac - s1s2 irregular,tachy,  no murmur Chest - decreased depth inspiration, pops and squeaks in bases.  No true wheezing.  Abd - soft, nontender, + bowel sounds  Ext - no edema Neuro - follows commands, moves all 4.   CBC    Component Value Date/Time   WBC 13.7* 02/06/2012 0350   RBC 2.94* 02/06/2012 0350   HGB 8.4* 02/06/2012 0350   HCT 27.2* 02/06/2012 0350   PLT 287 02/06/2012 0350   MCV 92.5 02/06/2012 0350   MCH 28.6 02/06/2012 0350   MCHC 30.9 02/06/2012 0350   RDW 18.6* 02/06/2012 0350   LYMPHSABS 1.0 01/11/2012 0340   MONOABS 0.7 01/11/2012 0340   EOSABS 0.0 01/11/2012 0340   BASOSABS 0.0 01/11/2012 0340    BMET    Component Value Date/Time   NA 143 02/08/2012 0515   K 3.9 02/08/2012 0515   CL 106 02/08/2012 0515   CO2 31 02/08/2012 0515   GLUCOSE 117* 02/08/2012 0515   BUN 11 02/08/2012 0515   CREATININE 0.99 02/08/2012 0515   CALCIUM 10.0 02/08/2012 0515   GFRNONAA 60* 02/08/2012 0515   GFRAA 69* 02/08/2012 0515    Dg Chest Port 1 View  02/08/2012  *RADIOLOGY REPORT*  Clinical Data: Dyspnea  PORTABLE CHEST - 1 VIEW  Comparison: 02/03/2012  Findings: Stable opacity in the subpleural right upper lobe.  Lungs are otherwise clear.  No pleural effusion or pneumothorax.  Heart is top normal in size.  Stable tracheostomy.  IMPRESSION: Stable opacity in the subpleural right upper lobe.  Original Report Authenticated By: Charline Bills, M.D.    ASSESSMENT/PLAN:  Acute respiratory  failure initially from Pneumococcal PNA/bacteremia, and E coli PNA.  Multiple rib fx after CPR.  Now with tracheomalacia limiting ability to proceed with extubation, thus requiring trach.  Has hx of asthma. - trach collar as tolerated, downsized to 4 cuffless -prn bronchodilators -fitted with PM valve 4/11 , talking around trach now  ID --Unexplained fever, tachy 4/14 -Empiric unasyn & po flagyl ,  cx neg , stool c diff not sent  - dc flagyl   4/21  Dysphagia -Swallow eval done 4/19>>Dysphagia 1 puree with honey thick liquid -dc panda  Agitated delirium/ anoxic encephalopathy - wonder if this is her new baseline -decrease klonopin to off, increase  risperdal to 1 mg - titrate up as needed -prn fentanyl  HTN, tachycardia -continue catapres -metoprolol 75 bid, use IV prn    Karilyn Wind M [phone] 02/08/2012, 12:51 PM

## 2012-02-09 LAB — GLUCOSE, CAPILLARY
Glucose-Capillary: 109 mg/dL — ABNORMAL HIGH (ref 70–99)
Glucose-Capillary: 109 mg/dL — ABNORMAL HIGH (ref 70–99)

## 2012-02-09 MED ORDER — HALOPERIDOL LACTATE 5 MG/ML IJ SOLN
10.0000 mg | INTRAMUSCULAR | Status: DC | PRN
  Filled 2012-02-09: qty 2

## 2012-02-09 MED ORDER — HALOPERIDOL LACTATE 5 MG/ML IJ SOLN
5.0000 mg | Freq: Once | INTRAMUSCULAR | Status: AC
  Administered 2012-02-09: 5 mg via INTRAVENOUS
  Filled 2012-02-09: qty 1

## 2012-02-09 MED ORDER — ALTEPLASE 2 MG IJ SOLR
2.0000 mg | Freq: Once | INTRAMUSCULAR | Status: AC
  Administered 2012-02-09: 2 mg
  Filled 2012-02-09: qty 2

## 2012-02-09 NOTE — Progress Notes (Signed)
eLink Physician-Brief Progress Note Patient Name: Emily Harrington DOB: 1951/07/03 MRN: [id number]  Date of Service  02/09/2012   HPI/Events of Note   1. Trach dislodged  eICU Interventions   1. RT unable to reinsert 2. Reasonable SpO2 on 2L [place] 3. Seems reasonable to leave out and reassess if need be       Kameren Pargas 02/09/2012, 6:47 PM

## 2012-02-09 NOTE — Progress Notes (Signed)
eLink Physician-Brief Progress Note Patient Name: Emily Harrington DOB: 1951-05-02 MRN: [id number]  Date of Service  02/09/2012   HPI/Events of Note  Call from nurse reporting ongoing agitation/delerium in patient on resperdal.  Requesting ativan.  eICU Interventions  Plan: 5 mg haldol IV now.  If patient remains agitated then 10 mg IV haldol q30 minutes as needed for ongoing agitation for total of 3 doses.  12 lead EKG in AM to evaluate QTc   Intervention Category Intermediate Interventions: Other: (Agitation/Delirium)  Marolyn Urschel 02/09/2012, 3:25 AM

## 2012-02-09 NOTE — Progress Notes (Signed)
Respiratory Therapy Note- Emily Harrington being performed by RN and aerosol being exchanged out, noted that Emily Harrington was unusually out. Pt. Had been rounded on all day and this was not noted earlier. Pt. Has had clear but diminished breath sounds with distress noted. Sp02 has been 94-100% most of the day. Several attempts to pass trach back into the patient with obutrator along with attempt with suction catheter, patient complaining that its sore. MD called and the trach will remain out for the evening and be re-evaluated in the AM.

## 2012-02-09 NOTE — Progress Notes (Signed)
Emily Harrington is a 61 y.o. female admitted on 01/08/2012 with Pneumococcal bacteremia, PEA arrest.  Transitioned to SDU 3/23.  Developed increasing dyspnea, and change in mental status and transfer back to ICU 3/30. Tracheostomy on 4/9 & off vent since 4/13  Line/tube: (Truncated 4/8) ETT 3/30>>4/4 ETT 4/4>>4/9 Lt IJ CVL 3/30>>4/8 Rt PICC 4/8>> Trach (DF) 4/9>>  Cultures: (Truncated 4/8) Blood 3/20>>Pneumococcus Sputum 3/20>>E coli BAL 4/1>>Candida C diff 4/2>>negative Blood 4/14 >>ng resp 4/14 >>candida   Antibiotics: (Truncated 4/8) Zosyn 3/30>>4/8 Vancomycin 3/30>>4/3 unasyn 4/14 (? 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  Responded to haldol 10mg  iv for one dose, but now is very sedated.  Her sitter states she has been awake this am at times.  Mild increased wob as yesterday.    OBJECTIVE:  Blood pressure 157/77, pulse 112, temperature 98.6 F (37 C), temperature source Oral, resp. rate 20, height 5\' 5"  (1.651 m), weight 80.7 kg (177 lb 14.6 oz), SpO2 100.00%. Wt Readings from Last 3 Encounters:  02/08/12 80.7 kg (177 lb 14.6 oz)   Body mass index is 29.61 kg/(m^2).  I/O last 3 completed shifts: In: 180 [P.O.:180] Out: 11 [Urine:4; Emesis/NG output:4; Stool:3]  Vent Mode:   [-]  FiO2 (%):  [30 %-35 %] 30 %  Physical Exam: General - mild increased wob but no distress.  lethargic HEENT - Trach site clean Cardiac - s1s2 irregular,tachy,  no murmur Chest - decreased depth inspiration, pops and squeaks in bases.  No true wheezing.  Abd - soft, nontender, + bowel sounds Ext - no edema Neuro - lethargic but arousable.  W/d to pain  CBC    Component Value Date/Time   WBC 13.7* 02/06/2012 0350   RBC 2.94* 02/06/2012 0350   HGB 8.4* 02/06/2012 0350   HCT 27.2* 02/06/2012 0350   PLT 287 02/06/2012 0350   MCV 92.5 02/06/2012 0350   MCH 28.6 02/06/2012 0350   MCHC 30.9 02/06/2012 0350   RDW 18.6* 02/06/2012 0350   LYMPHSABS 1.0 01/11/2012 0340   MONOABS 0.7 01/11/2012 0340   EOSABS 0.0 01/11/2012 0340   BASOSABS 0.0 01/11/2012 0340    BMET    Component Value Date/Time   NA 143 02/08/2012 0515   K 3.9 02/08/2012 0515   CL 106 02/08/2012 0515   CO2 31 02/08/2012 0515   GLUCOSE 117* 02/08/2012 0515   BUN 11 02/08/2012 0515   CREATININE 0.99 02/08/2012 0515   CALCIUM 10.0 02/08/2012 0515   GFRNONAA 60* 02/08/2012 0515   GFRAA 69* 02/08/2012 0515    Dg Chest Port 1 View  02/08/2012  *RADIOLOGY REPORT*  Clinical Data: Dyspnea  PORTABLE CHEST - 1 VIEW  Comparison: 02/03/2012  Findings: Stable opacity in the subpleural right upper lobe.  Lungs are otherwise clear.  No pleural effusion or pneumothorax.  Heart is top normal in size.  Stable tracheostomy.  IMPRESSION: Stable opacity in  the subpleural right upper lobe.  Original Report Authenticated By: Charline Bills, M.D.    ASSESSMENT/PLAN:  Acute respiratory failure initially from Pneumococcal PNA/bacteremia, and E coli PNA.  Multiple rib fx after CPR.  Now with tracheomalacia limiting ability to proceed with extubation, thus requiring trach.  Has hx of asthma. - trach collar as tolerated, downsized to 4 cuffless -prn bronchodilators -fitted with PM valve 4/11 , talking around trach now  ID --Unexplained fever, tachy  4/14 -Empiric unasyn & po flagyl ,  cx neg , stool c diff not sent  - dc flagyl in am.  (unasyn has already been stopped)  Dysphagia -Swallow eval done 4/19>>Dysphagia 1 puree with honey thick liquid -dc panda  Agitated delirium/ anoxic encephalopathy - wonder if this is her new baseline -decrease klonopin to off, increase  risperdal to 1 mg - titrate up as needed -prn fentanyl -responded to 10mg  haldol this am, but no overly sedated.  HTN, tachycardia -continue catapres -metoprolol 75 bid, use IV prn   Check labs in am. ?placement.   Jancie Kercher M [phone] 02/09/2012, 11:11 AM

## 2012-02-10 DIAGNOSIS — Z930 Tracheostomy status: Secondary | ICD-10-CM

## 2012-02-10 DIAGNOSIS — J9620 Acute and chronic respiratory failure, unspecified whether with hypoxia or hypercapnia: Secondary | ICD-10-CM

## 2012-02-10 LAB — BASIC METABOLIC PANEL
CO2: 29 mEq/L (ref 19–32)
Calcium: 9.3 mg/dL (ref 8.4–10.5)
Chloride: 102 mEq/L (ref 96–112)
Creatinine, Ser: 1.05 mg/dL (ref 0.50–1.10)
GFR calc Af Amer: 65 mL/min — ABNORMAL LOW (ref >90)
Sodium: 139 mEq/L (ref 135–145)

## 2012-02-10 LAB — GLUCOSE, CAPILLARY
Glucose-Capillary: 108 mg/dL — ABNORMAL HIGH (ref 70–99)
Glucose-Capillary: 119 mg/dL — ABNORMAL HIGH (ref 70–99)
Glucose-Capillary: 138 mg/dL — ABNORMAL HIGH (ref 70–99)
Glucose-Capillary: 157 mg/dL — ABNORMAL HIGH (ref 70–99)

## 2012-02-10 LAB — CBC
Platelets: 346 10*3/uL (ref 150–400)
RBC: 3.19 MIL/uL — ABNORMAL LOW (ref 3.87–5.11)
RDW: 20.2 % — ABNORMAL HIGH (ref 11.5–15.5)
WBC: 13 10*3/uL — ABNORMAL HIGH (ref 4.0–10.5)

## 2012-02-10 MED ORDER — PANTOPRAZOLE SODIUM 40 MG PO PACK
40.0000 mg | PACK | Freq: Every day | ORAL | Status: DC
  Administered 2012-02-10 – 2012-02-13 (×3): 40 mg
  Filled 2012-02-10 (×5): qty 20

## 2012-02-10 NOTE — Progress Notes (Signed)
   CARE MANAGEMENT NOTE 02/10/2012  Patient:  Emily Harrington, Emily Harrington   Account Number:  [id number]  Date Initiated:  01/20/2012  Documentation initiated by:  [facility]  Subjective/Objective Assessment:   01-08-12 admitted with PEA arrest - extubated and resp failure again on 01-19-12 requiring intubation.  Lives alone has family.     Action/Plan:   Anticipated DC Date:  02/14/2012   Anticipated DC Plan:  SKILLED NURSING FACILITY  In-house referral  Clinical Social Worker      DC Planning Services  CM consult      Choice offered to / List presented to:             Status of service:  In process, will continue to follow Medicare Important Message given?   (If response is "NO", the following Medicare IM given date fields will be blank) Date Medicare IM given:   Date Additional Medicare IM given:    Discharge Disposition:    Per UR Regulation:  Reviewed for med. necessity/level of care/duration of stay  If discussed at Long Length of Stay Meetings, dates discussed:   01/22/2012  01/29/2012  02/05/2012    Comments:  02/10/12 Emily Jellison,RN,BSN 1100 PER CSW, PT HAS ONE BED OFFER FOR SNF.  PT HAS PULLED OUT TRACH; MD STATES WILL NEED TO DECIDE WHETHER OR NOT TO RE-TRACH PT.  SPEECH THERAPY TO REEVALULATE PT NOW THAT SHE IS DECANNULATED.  WILL FOLLOW UP.  CSW UPDATED. Phone #[phone]

## 2012-02-10 NOTE — Progress Notes (Signed)
Physical Therapy Treatment Patient Details Name: Emily Harrington MRN: [id number] DOB: 1951/08/23 Today's Date: 02/10/2012 Time: 4098-1191 PT Time Calculation (min): 25 min  PT Assessment / Plan / Recommendation Comments on Treatment Session  Pt. admitted for PEA arrest.  Multiple complications with VDRF.  Now decannulated and on O2.  Patient is progressing with ambulation and strength.  Continues with poor safety awareness.      Follow Up Recommendations  Skilled nursing facility;Supervision/Assistance - 24 hour    Equipment Recommendations  Defer to next venue    Frequency Min 3X/week   Plan Discharge plan remains appropriate;Frequency remains appropriate    Precautions / Restrictions Precautions Precautions: Fall Restrictions Weight Bearing Restrictions: No   Pertinent Vitals/Pain VSS/ No pain    Mobility  Bed Mobility Bed Mobility: Rolling Right;Right Sidelying to Sit;Sitting - Scoot to Edge of Bed Rolling Right: 4: Min guard;With rail Right Sidelying to Sit: 4: Min guard;With rails;HOB flat Supine to Sit: Not tested (comment) Sitting - Scoot to Edge of Bed: 4: Min guard Sit to Supine: Not Tested (comment) Details for Bed Mobility Assistance: cues for technique Transfers Transfers: Sit to Stand;Stand to Sit Sit to Stand: 1: +2 Total assist;From elevated surface;With upper extremity assist;From bed Sit to Stand: Patient Percentage: 80% Stand to Sit: 1: +2 Total assist;With upper extremity assist;With armrests;To chair/3-in-1 Stand to Sit: Patient Percentage: 80% Stand Pivot Transfers: Not tested (comment) Squat Pivot Transfers: Not tested (comment) Ambulation/Gait Ambulation/Gait Assistance: 1: +2 Total assist Ambulation/Gait: Patient Percentage: 80 Ambulation Distance (Feet): 28 Feet Assistive device: Rolling walker Ambulation/Gait Assistance Details: Ambulated 14 feet x2.  Needed cues and assist to steer RW and sequence steps.   Gait Pattern: Step-to  pattern;Shuffle;Trunk flexed Stairs: No Wheelchair Mobility Wheelchair Mobility: No         PT Goals Acute Rehab PT Goals Time For Goal Achievement: 02/20/12 Potential to Achieve Goals: Good PT Goal: Supine/Side to Sit - Progress: Progressing toward goal PT Goal: Sit at Edge Of Bed - Progress: Progressing toward goal PT Goal: Sit to Stand - Progress: Progressing toward goal PT Goal: Stand to Sit - Progress: Progressing toward goal PT Goal: Ambulate - Progress: Progressing toward goal  Visit Information  Last PT Received On: 02/10/12 Assistance Needed: +2    Subjective Data  Subjective: "I am going to the bank."  Patient confused.  Patient Stated Goal: To go home.   Cognition  Overall Cognitive Status: Impaired Area of Impairment: Attention;Following commands;Safety/judgement;Awareness of errors;Awareness of deficits;Problem solving Arousal/Alertness: Awake/alert Orientation Level: Person Behavior During Session: Anxious Current Attention Level: Focused Attention - Other Comments: up to 2  minutes Following Commands: Follows multi-step commands inconsistently;Follows multi-step commands with increased time Safety/Judgement: Decreased awareness of safety precautions;Decreased safety judgement for tasks assessed;Decreased awareness of need for assistance;Impulsive Safety/Judgement - Other Comments: Poor safety awareness with all tasks. Awareness of Errors: Assistance required to identify errors made;Assistance required to correct errors made    Balance  Static Sitting Balance Static Sitting - Balance Support: Feet supported;No upper extremity supported Static Sitting - Level of Assistance: 7: Independent Static Sitting - Comment/# of Minutes: 3  End of Session PT - End of Session Equipment Utilized During Treatment: Gait belt (Now on 2 L O2 with O2 94 % and >) Activity Tolerance: Patient limited by fatigue Patient left: in chair;with call bell/phone within reach;with  nursing in room (sitter in room) Nurse Communication: Mobility status    INGOLD,Tanina Barb 02/10/2012, 4:42 PM [facility] [phone] (pager)

## 2012-02-10 NOTE — Progress Notes (Signed)
Pt agitated and trying to get out of bed. MD on call notified to give prn for agitation for patient's safety. Orders given, and carried out. Will continue to monitor.

## 2012-02-10 NOTE — Progress Notes (Signed)
UR Completed.  Emily Harrington Jane [phone] 02/10/2012  

## 2012-02-10 NOTE — Progress Notes (Signed)
Clinical Child psychotherapist received notification, via email, that [facility] will be able to offer a bed for pt.  CSW to continue to follow and assist as needed.     Angelia Mould, MSW, [place] [phone]

## 2012-02-10 NOTE — Progress Notes (Signed)
Emily Harrington is a 61 y.o. female admitted on 01/08/2012 with Pneumococcal bacteremia, PEA arrest.  Transitioned to SDU 3/23.  Developed increasing dyspnea, and change in mental status and transfer back to ICU 3/30. Tracheostomy on 4/9 & off vent since 4/13  Line/tube: (Truncated 4/8) ETT 3/30>>4/4, ETT 4/4>>4/9,Trach (DF) 4/9>> (pulled out 02/09/12 am) Lt IJ CVL 3/30>>4/8 Rt PICC 4/8>>   Cultures: (Truncated 4/8) Blood 3/20>>Pneumococcus Sputum 3/20>>E coli BAL 4/1>>Candida C diff 4/2>>negative Blood 4/14 >>ng resp 4/14 >>candida  Recent Results (from the past 240 hour(s))  CULTURE, BLOOD (ROUTINE X 2)     Status: Normal   Collection Time   02/02/12 10:08 AM      Component Value Range Status Comment   Specimen Description BLOOD LEFT ARM   Final    Special Requests BOTTLES DRAWN AEROBIC AND ANAEROBIC 10CC   Final    Culture  Setup Time [id number]   Final    Culture NO GROWTH 5 DAYS   Final    Report Status 02/08/2012 FINAL   Final   CULTURE, BLOOD (ROUTINE X 2)     Status: Normal   Collection Time   02/02/12 10:15 AM      Component Value Range Status Comment   Specimen Description BLOOD LEFT HAND   Final    Special Requests BOTTLES DRAWN AEROBIC ONLY 10CC   Final    Culture  Setup Time [id number]   Final    Culture NO GROWTH 5 DAYS   Final    Report Status 02/08/2012 FINAL   Final   CULTURE, RESPIRATORY     Status: Normal   Collection Time   02/02/12  5:30 PM      Component Value Range Status Comment   Specimen Description TRACHEAL ASPIRATE   Final    Special Requests NONE   Final    Gram Stain     Final    Value: FEW WBC PRESENT,BOTH PMN AND MONONUCLEAR     FEW SQUAMOUS EPITHELIAL CELLS PRESENT     FEW GRAM POSITIVE COCCI IN PAIRS   Culture MODERATE CANDIDA ALBICANS   Final    Report Status 02/05/2012 FINAL   Final      Antibiotics: (Truncated 4/8) Zosyn 3/30>>4/8 Vancomycin 3/30>>4/3 unasyn 4/14 (? Aspn, fever, wc) >>4/19 (planned) Flagyl 4/14 (c.diff ) >  4/21  Anti-infectives     Start     Dose/Rate Route Frequency Ordered Stop   02/07/12 2300   metroNIDAZOLE (FLAGYL) IVPB 500 mg        500 mg 100 mL/hr over 60 Minutes Intravenous 3 times per day 02/07/12 2138     02/02/12 2000   Ampicillin-Sulbactam (UNASYN) 3 g in sodium chloride 0.9 % 100 mL IVPB  Status:  Discontinued        3 g 100 mL/hr over 60 Minutes Intravenous Every 6 hours 02/02/12 1931 02/07/12 1149   02/02/12 1000   metroNIDAZOLE (FLAGYL) 50 mg/ml oral suspension 500 mg  Status:  Discontinued        500 mg Oral 3 times daily 02/02/12 0937 02/07/12 2138   01/22/12 1800   vancomycin (VANCOCIN) 750 mg in sodium chloride 0.9 % 150 mL IVPB  Status:  Discontinued        750 mg 150 mL/hr over 60 Minutes Intravenous Every 24 hours 01/22/12 1351 01/23/12 0909   01/18/12 1300   vancomycin (VANCOCIN) IVPB 1000 mg/200 mL premix  Status:  Discontinued        1,000 mg  200 mL/hr over 60 Minutes Intravenous Every 24 hours 01/18/12 1215 01/22/12 1350   01/18/12 1300   piperacillin-tazobactam (ZOSYN) IVPB 3.375 g  Status:  Discontinued        3.375 g 12.5 mL/hr over 240 Minutes Intravenous Every 8 hours 01/18/12 1215 01/27/12 1026   01/13/12 1000   levofloxacin (LEVAQUIN) tablet 250 mg  Status:  Discontinued        250 mg Oral Daily 01/12/12 1150 01/18/12 1145   01/12/12 1200   levofloxacin (LEVAQUIN) tablet 500 mg        500 mg Oral  Once 01/12/12 1135 01/12/12 1250   01/12/12 1200   levofloxacin (LEVAQUIN) tablet 250 mg  Status:  Discontinued        250 mg Oral Daily 01/12/12 1149 01/12/12 1150   01/09/12 1100   Levofloxacin (LEVAQUIN) IVPB 750 mg  Status:  Discontinued        750 mg 100 mL/hr over 90 Minutes Intravenous Every 48 hours 01/09/12 1028 01/12/12 1133   01/09/12 1000   vancomycin (VANCOCIN) IVPB 1000 mg/200 mL premix  Status:  Discontinued        1,000 mg 200 mL/hr over 60 Minutes Intravenous Every 24 hours 01/09/12 1403 01/11/12 0956   01/08/12 2300    piperacillin-tazobactam (ZOSYN) IVPB 3.375 g  Status:  Discontinued        3.375 g 12.5 mL/hr over 240 Minutes Intravenous Every 8 hours 01/08/12 1659 01/11/12 0956   01/08/12 1800   vancomycin (VANCOCIN) 1,500 mg in sodium chloride 0.9 % 500 mL IVPB  Status:  Discontinued        1,500 mg 250 mL/hr over 120 Minutes Intravenous Every 12 hours 01/08/12 1659 01/09/12 1403   01/08/12 1700  piperacillin-tazobactam (ZOSYN) IVPB 3.375 g       3.375 g 100 mL/hr over 30 Minutes Intravenous  Once 01/08/12 1659 01/08/12 2043           Tests/events: 3/20 Echo>>EF 55 to 60%, mild LVH 3/20 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 3/20 CT abd/pelvis>>Large Rt renal pelvis stone, avascular necrosis Lt femoral head 3/22 EEG>>non-specific slow rhythm, no epileptiform activity 3/27 Renal u/s>>Large Rt renal pelvis stone w/o hydronephrosis 3/29 CT chest>>decrease RUL ASD, patchy LUL ASD/ATX, b/l effusions, multiple rib fx's and probable sternal fx 3/29 Doppler legs>>no DVT in either Rt or Lt legs 4/1 Bronchoscopy>>atypical cells in cytology from RUL 4/4 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 Currently sleeping but per sitter and RN - partially confused and mild restless but still cooperative and able to self feed.   OBJECTIVE:  Blood pressure 114/75, pulse 122, temperature 98.5 F (36.9 C), temperature source Axillary, resp. rate 17, height 5\' 5"  (1.651 m), weight 80.7 kg (177 lb 14.6 oz), SpO2 100.00%. Wt Readings from Last 3 Encounters:  02/08/12 80.7 kg (177 lb 14.6 oz)   Body mass index is 29.61 kg/(m^2).  I/O last 3 completed shifts: In: 270 [I.V.:240; Other:30] Out: 200 [Urine:200]  Vent Mode:  [-]  FiO2 (%):  [28 %] 28 %  Physical Exam: General - mild increased wob but no distress. Sleepiung. Chronically unwell looking HEENT - Trach site  clean Cardiac - s1s2 irregular,tachy,  no murmur Chest - decreased depth inspiration, pops and squeaks in bases.  No true wheezing.  Abd - soft, nontender, + bowel sounds Ext - no edema Neuro - Before sleeping was alert and did  eat without aspiration CBC    Component Value Date/Time   WBC 13.0* 02/10/2012 0400   RBC 3.19* 02/10/2012 0400   HGB 9.2* 02/10/2012 0400   HCT 29.4* 02/10/2012 0400   PLT 346 02/10/2012 0400   MCV 92.2 02/10/2012 0400   MCH 28.8 02/10/2012 0400   MCHC 31.3 02/10/2012 0400   RDW 20.2* 02/10/2012 0400   LYMPHSABS 1.0 01/11/2012 0340   MONOABS 0.7 01/11/2012 0340   EOSABS 0.0 01/11/2012 0340   BASOSABS 0.0 01/11/2012 0340    BMET    Component Value Date/Time   NA 139 02/10/2012 0400   K 3.6 02/10/2012 0400   CL 102 02/10/2012 0400   CO2 29 02/10/2012 0400   GLUCOSE 113* 02/10/2012 0400   BUN 10 02/10/2012 0400   CREATININE 1.05 02/10/2012 0400   CALCIUM 9.3 02/10/2012 0400   GFRNONAA 56* 02/10/2012 0400   GFRAA 65* 02/10/2012 0400   No results found.   No results found.  ASSESSMENT/PLAN:  Acute respiratory failure initially from Pneumococcal PNA/bacteremia, and E coli PNA.  Multiple rib fx after CPR.  Now with tracheomalacia limiting ability to proceed with extubation, thus requiring trach.  Has hx of asthma.    - On 4/21 - self decannulated trach. Maintaining respiratory status  PLAN  - NAsal cannula oxygen  - remains at high risk for resp distress due to tracheomalacia; will need to sort out expectant approach v re-trach electively. Monitor fornow - trach collar as tolerated, downsized to 4 cuffless -prn bronchodilators   ID --Unexplained fever,Low grade fever 99.4 Lab 02/06/12 0350  PROCALCITON 0.14    Lab 02/10/12 0400 02/06/12 0350 02/04/12 0530  WBC 13.0* 13.7* 16.7*   PLAN -dc flagyl in am.  (unasyn has already been stopped)  Dysphagia -Swallow eval done 4/19>>Dysphagia 1 puree with honey thick liquid -dc panda - reconsult speech again due  to decannulation  Agitated delirium/ anoxic encephalopathy - wonder if this is her new baseline -decrease klonopin to off, increase  risperdal to 1 mg - titrate up as needed -prn fentanyl -responded to 10mg  haldol this am, but no overly sedated. - might need neuro consult depending on course  HTN, tachycardia -continue catapres -metoprolol 75 bid, use IV prn      Yi Haugan [phone] 02/10/2012, 9:14 AM

## 2012-02-10 NOTE — Progress Notes (Signed)
Speech Language Pathology Dysphagia Treatment  Patient Details Name: Emily Harrington MRN: [id number] DOB: 09-04-1951 Today's Date: 02/10/2012  SLP Assessment/Plan/Recommendation Assessment / Recommendations / Plan Clinical Impression Statement: Pt was seen by SLP for skilled observation to assess diet tolerance. The patient, who has improved mental status today, was observed with pureed solids and honey-thick liquids via teaspoon and cup sips with no overt s/s of aspiration noted. Min verbal cues were provided to use a slow rate with small bites/sips to allow her to catch her breath before consuming more, given her decreased respiratory status. Education provided regarding the importance of using her strategies. Pt safe to continue with current dys 1 (pureed) diet with honey-thick liquids via teaspoon only. SLP to continue to f/u to assess possible diet advancement if pt's improved mentation persists. Continue with Current Diet: Dysphagia 1 (puree);Honey-thick liquid Liquids provided via: Teaspoon Medication Administration: Crushed with puree Supervision: Patient able to self feed;Full supervision/cueing for compensatory strategies Compensations: Slow rate;Small sips/bites Postural Changes and/or Swallow Maneuvers: Seated upright 90 degrees;Upright 30-60 min after meal Oral Care Recommendations: Oral care BID Plan: Continue with current plan of care Swallowing Goals  SLP Swallowing Goals Patient will consume recommended diet without observed clinical signs of aspiration with: Moderate assistance Swallow Study Goal #1 - Progress: Progressing toward goal Patient will utilize recommended strategies during swallow to increase swallowing safety with: Moderate assistance Swallow Study Goal #2 - Progress: Progressing toward goal  General Temperature Spikes Noted: No Respiratory Status: Supplemental O2 delivered via (comment) (Nasal cannula; 3L) Behavior/Cognition: Alert;Cooperative;Requires  cueing Oral Cavity - Dentition: Edentulous Patient Positioning: Upright in bed  Oral Cavity - Oral Hygiene Does patient have any of the following "at risk" factors?: Diet - patient on thickened liquids;Other - dysphagia;Oxygen therapy - cannula, mask, simple oxygen devices Brush patient's teeth BID with toothbrush (using toothpaste with fluoride): Yes Patient is AT RISK - Oral Care Protocol followed (see row info): Yes   Dysphagia Treatment Treatment focused on: Skilled observation of diet tolerance;Patient/family/caregiver education;Utilization of compensatory strategies Treatment Methods/Modalities: Skilled observation Patient observed directly with PO's: Yes Type of PO's observed: Dysphagia 1 (puree);Honey-thick liquids Feeding: Able to feed self Liquids provided via: Teaspoon;Cup;No straw Oral Phase Signs & Symptoms: Prolonged oral phase (prolonged oral phase with pureed solids) Type of cueing: Verbal Amount of cueing: Minimal   Maxcine Ham 02/10/2012, 12:28 PM  Maxcine Ham, SLP Student

## 2012-02-11 LAB — GLUCOSE, CAPILLARY
Glucose-Capillary: 115 mg/dL — ABNORMAL HIGH (ref 70–99)
Glucose-Capillary: 92 mg/dL (ref 70–99)

## 2012-02-11 MED ORDER — ENSURE PUDDING PO PUDG
1.0000 | Freq: Three times a day (TID) | ORAL | Status: DC
  Administered 2012-02-11 – 2012-02-14 (×6): 1 via ORAL

## 2012-02-11 NOTE — Progress Notes (Signed)
Nutrition Follow-up  Pt's EN and Panda tube discontinued.  Tracheostomy change 4/18.  S/p MBSS 4/19.  PO intake 0-50% per flowsheet records. Per pt's sitter, ate very little of breakfast this AM.  Amenable to chocolate Ensure Pudding -- RD to order.  Diet Order:  Dysphagia 1, honey thick liquids  Meds: Scheduled Meds:   . antiseptic oral rinse  15 mL Mouth Rinse QID  . aspirin EC  162 mg Oral Daily  . chlorhexidine  15 mL Mouth Rinse BID  . cloNIDine  0.1 mg Transdermal Weekly  . enoxaparin (LOVENOX) injection  40 mg Subcutaneous Q24H  . Gerhardt's butt cream   Topical BID  . insulin aspart  0-15 Units Subcutaneous Q4H  . metoprolol  5 mg Intravenous Q6H  . nystatin   Topical TID  . pantoprazole sodium  40 mg Per Tube Q1200  . risperiDONE  1 mg Oral Daily  . sodium chloride  10-40 mL Intracatheter Q12H  . DISCONTD: metronidazole  500 mg Intravenous Q8H  . DISCONTD: pantoprazole (PROTONIX) IV  40 mg Intravenous Q24H   Continuous Infusions:   . sodium chloride 20 mL/hr (02/10/12 2211)   PRN Meds:.acetaminophen (TYLENOL) oral liquid 160 mg/5 mL, albuterol, fentaNYL, food thickener, ipratropium, metoprolol, sodium chloride  Labs:  CMP     Component Value Date/Time   NA 139 02/10/2012 0400   K 3.6 02/10/2012 0400   CL 102 02/10/2012 0400   CO2 29 02/10/2012 0400   GLUCOSE 113* 02/10/2012 0400   BUN 10 02/10/2012 0400   CREATININE 1.05 02/10/2012 0400   CALCIUM 9.3 02/10/2012 0400   PROT 5.9* 01/17/2012 0425   ALBUMIN 2.1* 01/17/2012 0425   AST 30 01/17/2012 0425   ALT 42* 01/17/2012 0425   ALKPHOS 158* 01/17/2012 0425   BILITOT 0.2* 01/17/2012 0425   GFRNONAA 56* 02/10/2012 0400   GFRAA 65* 02/10/2012 0400     Intake/Output Summary (Last 24 hours) at 02/11/12 1142 Last data filed at 02/11/12 0923  Gross per 24 hour  Intake   1260 ml  Output    178 ml  Net   1082 ml    CBG (last 3)   Basename 02/11/12 0806 02/11/12 0434 02/11/12 0010  GLUCAP 114* 99 92    Weight Status:   80.7 kg (4/20) -- trending down  Re-estimated needs:  1500-1700 kcals, 80-90 gm protein  Nutrition Dx:  Inadequate Oral Intake now related to dysphagia & delirium as evidenced by PO intake 0-50%.  Status: Ongoing.  New Goal:  Oral intake to meet >90% of estimated nutrition needs, unmet Monitor: PO & supplemental intake, weight, labs, I/O's  Intervention:    Add chocolate Ensure Pudding PO TID (170 kcals, 4 gm protein per 4 oz cup)  RD to follow for nutrition care plan   Alger Memos Pager #:  [phone]

## 2012-02-11 NOTE — Consult Note (Signed)
[facility] CONSULT NOTE     Reason for Consult: confusion    HPI:    Emily Harrington is an 61 y.o. female who was brought to the ED after she crawled from her room and lost consciousness 01/08/2012. EMS was called and patient was brought to the ED. Enroute the patient decompensated and became combative and unable to protect airway. Patient also dropped her pressure and was intubated enroute to the ED. While in the ED she developed hypotension and had cardiac arrest PEA.  She was found to have Pneumococcal PNA.  Blood cultures grew Pneumococcus, Sputum showed E. Coli, and BAL showed Candida. Patient was treated with Zosyn, Vancomycin and most recently Unasyn and Flagyl (for C. Diff).  EEG 3/22 showed slowing and no epileptiform activity.   EEG 3/22--slowing and no epileptiform activity. CT head 01/08/12--There is preservation of the normal gray-white differentiation. The ventricular volumes are maintained and the sulci appear normal. No specific features identified to suggest diffuse anoxic injury  Patient remains to have confusion and neurology was consulted for recommendations.     Past Medical History  Diagnosis Date  . Asthma     Past Surgical History  Procedure Date  . Cholecystectomy   . Abdominal hysterectomy     History reviewed. No pertinent family history.  Social History:  reports that she has never smoked. She does not have any smokeless tobacco history on file. She reports that she does not drink alcohol or use illicit drugs.  Allergies  Allergen Reactions  . Nsaids     Medications:    Prior to Admission:  Prescriptions prior to admission  Medication Sig Dispense Refill  . albuterol (PROVENTIL HFA;VENTOLIN HFA) 108 (90 BASE) MCG/ACT inhaler Inhale 2 puffs into the lungs every 4 (four) hours as needed. For wheezing or shortness of breath.      Marland Kitchen aspirin EC 81 MG tablet Take 162 mg by mouth daily.      . Multiple Vitamin  (MULTIVITAMIN) capsule Take 1 capsule by mouth daily.         Scheduled:   . antiseptic oral rinse  15 mL Mouth Rinse QID  . aspirin EC  162 mg Oral Daily  . chlorhexidine  15 mL Mouth Rinse BID  . cloNIDine  0.1 mg Transdermal Weekly  . enoxaparin (LOVENOX) injection  40 mg Subcutaneous Q24H  . feeding supplement  1 Container Oral TID WC  . Gerhardt's butt cream   Topical BID  . insulin aspart  0-15 Units Subcutaneous Q4H  . metoprolol  5 mg Intravenous Q6H  . nystatin   Topical TID  . pantoprazole sodium  40 mg Per Tube Q1200  . risperiDONE  1 mg Oral Daily  . sodium chloride  10-40 mL Intracatheter Q12H  . DISCONTD: metronidazole  500 mg Intravenous Q8H    Review of Systems - General ROS: negative for - chills, fatigue, fever or hot flashes Hematological and Lymphatic ROS: negative for - bruising, fatigue, jaundice or pallor Endocrine ROS: negative for - hair pattern changes, hot flashes, mood swings or skin changes Respiratory ROS: negative for - cough, hemoptysis, orthopnea or wheezing Cardiovascular ROS: negative for - dyspnea on exertion, orthopnea, palpitations or shortness of breath Gastrointestinal ROS: negative for - abdominal pain, appetite loss, blood in stools, diarrhea or hematemesis Musculoskeletal ROS: negative for - joint pain, joint stiffness, joint swelling  or muscle pain Neurological ROS: See HPI Dermatological ROS: negative for dry skin, pruritus and rash   Blood pressure 122/65, pulse 120, temperature 99.1 F (37.3 C), temperature source Oral, resp. rate 18, height 5\' 5"  (1.651 m), weight 80.7 kg (177 lb 14.6 oz), SpO2 99.00%.   Neurologic Examination:   Mental Status: Alert, oriented to [place] then added it was located behind the grocery store. She is able to follow commands and name objects along with telling their purpose. Speech fluent without evidence of aphasia. Able to follow 3 step commands without difficulty. Cranial Nerves: II-Visual fields  grossly intact. III/IV/VI-Extraocular movements intact.  Pupils reactive bilaterally. V/VII-Smile symmetric VIII-grossly intact IX/X-normal gag XI-bilateral shoulder shrug XII-midline tongue extension Motor: 5/5 bilaterally LE and 4/5 bilaterally UE with normal tone and bulk Sensory: Pinprick and light touch intact throughout, bilaterally, when tested she continued to repeat that I was touching left side (when I was touching the right side) but showed no extinction to DSS Deep Tendon Reflexes: 2+ and symmetric throughout Plantars: Downgoing bilaterally Cerebellar: Normal finger-to-nose, normal rapid alternating movements and normal heel-to-shin test.  Normal gait and station.   No results found for this basename: cbc, bmp, coags, chol, tri, ldl, hga1c    Results for orders placed during the hospital encounter of 01/08/12 (from the past 48 hour(s))  GLUCOSE, CAPILLARY     Status: Abnormal   Collection Time   02/09/12  8:33 PM      Component Value Range Comment   Glucose-Capillary 112 (*) 70 - 99 (mg/dL)    Comment 1 Documented in Chart      Comment 2 Notify RN     GLUCOSE, CAPILLARY     Status: Abnormal   Collection Time   02/10/12 12:25 AM      Component Value Range Comment   Glucose-Capillary 119 (*) 70 - 99 (mg/dL)    Comment 1 Documented in Chart      Comment 2 Notify RN     BASIC METABOLIC PANEL     Status: Abnormal   Collection Time   02/10/12  4:00 AM      Component Value Range Comment   Sodium 139  135 - 145 (mEq/L)    Potassium 3.6  3.5 - 5.1 (mEq/L)    Chloride 102  96 - 112 (mEq/L)    CO2 29  19 - 32 (mEq/L)    Glucose, Bld 113 (*) 70 - 99 (mg/dL)    BUN 10  6 - 23 (mg/dL)    Creatinine, Ser 1.61  0.50 - 1.10 (mg/dL)    Calcium 9.3  8.4 - 10.5 (mg/dL)    GFR calc non Af Amer 56 (*) >90 (mL/min)    GFR calc Af Amer 65 (*) >90 (mL/min)   CBC     Status: Abnormal   Collection Time   02/10/12  4:00 AM      Component Value Range Comment   WBC 13.0 (*) 4.0 - 10.5  (K/uL)    RBC 3.19 (*) 3.87 - 5.11 (MIL/uL)    Hemoglobin 9.2 (*) 12.0 - 15.0 (g/dL)    HCT 09.6 (*) 04.5 - 46.0 (%)    MCV 92.2  78.0 - 100.0 (fL)    MCH 28.8  26.0 - 34.0 (pg)    MCHC 31.3  30.0 - 36.0 (g/dL)    RDW 40.9 (*) 81.1 - 15.5 (%)    Platelets 346  150 - 400 (K/uL)   GLUCOSE, CAPILLARY  Status: Abnormal   Collection Time   02/10/12  4:40 AM      Component Value Range Comment   Glucose-Capillary 106 (*) 70 - 99 (mg/dL)    Comment 1 Documented in Chart      Comment 2 Notify RN     GLUCOSE, CAPILLARY     Status: Abnormal   Collection Time   02/10/12  8:42 AM      Component Value Range Comment   Glucose-Capillary 108 (*) 70 - 99 (mg/dL)    Comment 1 Documented in Chart      Comment 2 Notify RN     GLUCOSE, CAPILLARY     Status: Abnormal   Collection Time   02/10/12 12:06 PM      Component Value Range Comment   Glucose-Capillary 138 (*) 70 - 99 (mg/dL)    Comment 1 Notify RN     GLUCOSE, CAPILLARY     Status: Abnormal   Collection Time   02/10/12  4:34 PM      Component Value Range Comment   Glucose-Capillary 157 (*) 70 - 99 (mg/dL)    Comment 1 Documented in Chart      Comment 2 Notify RN     GLUCOSE, CAPILLARY     Status: Abnormal   Collection Time   02/10/12  7:30 PM      Component Value Range Comment   Glucose-Capillary 118 (*) 70 - 99 (mg/dL)    Comment 1 Documented in Chart      Comment 2 Notify RN     GLUCOSE, CAPILLARY     Status: Normal   Collection Time   02/11/12 12:10 AM      Component Value Range Comment   Glucose-Capillary 92  70 - 99 (mg/dL)   GLUCOSE, CAPILLARY     Status: Normal   Collection Time   02/11/12  4:34 AM      Component Value Range Comment   Glucose-Capillary 99  70 - 99 (mg/dL)   GLUCOSE, CAPILLARY     Status: Abnormal   Collection Time   02/11/12  8:06 AM      Component Value Range Comment   Glucose-Capillary 114 (*) 70 - 99 (mg/dL)    Comment 1 Notify RN     GLUCOSE, CAPILLARY     Status: Abnormal   Collection Time    02/11/12 11:34 AM      Component Value Range Comment   Glucose-Capillary 175 (*) 70 - 99 (mg/dL)    Comment 1 Notify RN       No results found.   Assessment/Plan:   61 YO female who was brought to ED after PEA arrest and found to have E. Coli and Pneumococcal PNA S/P arrest. Patient continues to show agitaion and delirium.  Can not rule out the possibility of cerebral hypoperfusion injury.  Further work up recommended.  With fever and low grade temp can not rue out the possibility of infectious influence   Recommend: 1) MRI brain  2) Keep sedating medication to a minimum.    Felicie Morn PA-C [facility] [phone]  02/11/2012, 4:05 PM    Patient seen and examined. I agree with the above.  Thana Farr, MD [facility] [phone]  02/11/2012  6:47 PM

## 2012-02-11 NOTE — Progress Notes (Signed)
Emily Harrington is a 61 y.o. female admitted on 01/08/2012 with Pneumococcal bacteremia, PEA arrest.  Transitioned to SDU 3/23.  Developed increasing dyspnea, and change in mental status and transfer back to ICU 3/30. Tracheostomy on 4/9 & off vent since 4/13  Line/tube: (Truncated 4/8) ETT 3/30>>4/4, ETT 4/4>>4/9,Trach (DF) 4/9>> pulled out 02/09/12 am.  Lt IJ CVL 3/30>>4/8 Rt PICC 4/8>>   Cultures: (Truncated 4/8) Blood 3/20>>Pneumococcus Sputum 3/20>>E coli BAL 4/1>>Candida C diff 4/2>>negative Blood 4/14 >>ng resp 4/14 >>candida  Recent Results (from the past 240 hour(s))  CULTURE, BLOOD (ROUTINE X 2)     Status: Normal   Collection Time   02/02/12 10:08 AM      Component Value Range Status Comment   Specimen Description BLOOD LEFT ARM   Final    Special Requests BOTTLES DRAWN AEROBIC AND ANAEROBIC 10CC   Final    Culture  Setup Time [id number]   Final    Culture NO GROWTH 5 DAYS   Final    Report Status 02/08/2012 FINAL   Final   CULTURE, BLOOD (ROUTINE X 2)     Status: Normal   Collection Time   02/02/12 10:15 AM      Component Value Range Status Comment   Specimen Description BLOOD LEFT HAND   Final    Special Requests BOTTLES DRAWN AEROBIC ONLY 10CC   Final    Culture  Setup Time [id number]   Final    Culture NO GROWTH 5 DAYS   Final    Report Status 02/08/2012 FINAL   Final   CULTURE, RESPIRATORY     Status: Normal   Collection Time   02/02/12  5:30 PM      Component Value Range Status Comment   Specimen Description TRACHEAL ASPIRATE   Final    Special Requests NONE   Final    Gram Stain     Final    Value: FEW WBC PRESENT,BOTH PMN AND MONONUCLEAR     FEW SQUAMOUS EPITHELIAL CELLS PRESENT     FEW GRAM POSITIVE COCCI IN PAIRS   Culture MODERATE CANDIDA ALBICANS   Final    Report Status 02/05/2012 FINAL   Final      Antibiotics: (Truncated 4/8) Zosyn 3/30>>4/8 Vancomycin 3/30>>4/3 unasyn 4/14 (? Aspn, fever, wc) >>4/19 (planned) Flagyl 4/14 (c.diff ) >  4/21  Anti-infectives     Start     Dose/Rate Route Frequency Ordered Stop   02/07/12 2300   metroNIDAZOLE (FLAGYL) IVPB 500 mg        500 mg 100 mL/hr over 60 Minutes Intravenous 3 times per day 02/07/12 2138     02/02/12 2000   Ampicillin-Sulbactam (UNASYN) 3 g in sodium chloride 0.9 % 100 mL IVPB  Status:  Discontinued        3 g 100 mL/hr over 60 Minutes Intravenous Every 6 hours 02/02/12 1931 02/07/12 1149   02/02/12 1000   metroNIDAZOLE (FLAGYL) 50 mg/ml oral suspension 500 mg  Status:  Discontinued        500 mg Oral 3 times daily 02/02/12 0937 02/07/12 2138   01/22/12 1800   vancomycin (VANCOCIN) 750 mg in sodium chloride 0.9 % 150 mL IVPB  Status:  Discontinued        750 mg 150 mL/hr over 60 Minutes Intravenous Every 24 hours 01/22/12 1351 01/23/12 0909   01/18/12 1300   vancomycin (VANCOCIN) IVPB 1000 mg/200 mL premix  Status:  Discontinued        1,000  mg 200 mL/hr over 60 Minutes Intravenous Every 24 hours 01/18/12 1215 01/22/12 1350   01/18/12 1300   piperacillin-tazobactam (ZOSYN) IVPB 3.375 g  Status:  Discontinued        3.375 g 12.5 mL/hr over 240 Minutes Intravenous Every 8 hours 01/18/12 1215 01/27/12 1026   01/13/12 1000   levofloxacin (LEVAQUIN) tablet 250 mg  Status:  Discontinued        250 mg Oral Daily 01/12/12 1150 01/18/12 1145   01/12/12 1200   levofloxacin (LEVAQUIN) tablet 500 mg        500 mg Oral  Once 01/12/12 1135 01/12/12 1250   01/12/12 1200   levofloxacin (LEVAQUIN) tablet 250 mg  Status:  Discontinued        250 mg Oral Daily 01/12/12 1149 01/12/12 1150   01/09/12 1100   Levofloxacin (LEVAQUIN) IVPB 750 mg  Status:  Discontinued        750 mg 100 mL/hr over 90 Minutes Intravenous Every 48 hours 01/09/12 1028 01/12/12 1133   01/09/12 1000   vancomycin (VANCOCIN) IVPB 1000 mg/200 mL premix  Status:  Discontinued        1,000 mg 200 mL/hr over 60 Minutes Intravenous Every 24 hours 01/09/12 1403 01/11/12 0956   01/08/12 2300    piperacillin-tazobactam (ZOSYN) IVPB 3.375 g  Status:  Discontinued        3.375 g 12.5 mL/hr over 240 Minutes Intravenous Every 8 hours 01/08/12 1659 01/11/12 0956   01/08/12 1800   vancomycin (VANCOCIN) 1,500 mg in sodium chloride 0.9 % 500 mL IVPB  Status:  Discontinued        1,500 mg 250 mL/hr over 120 Minutes Intravenous Every 12 hours 01/08/12 1659 01/09/12 1403   01/08/12 1700  piperacillin-tazobactam (ZOSYN) IVPB 3.375 g       3.375 g 100 mL/hr over 30 Minutes Intravenous  Once 01/08/12 1659 01/08/12 2043           Tests/events: 3/20 Echo>>EF 55 to 60%, mild LVH 3/20 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 3/20 CT abd/pelvis>>Large Rt renal pelvis stone, avascular necrosis Lt femoral head 3/22 EEG>>non-specific slow rhythm, no epileptiform activity 3/27 Renal u/s>>Large Rt renal pelvis stone w/o hydronephrosis 3/29 CT chest>>decrease RUL ASD, patchy LUL ASD/ATX, b/l effusions, multiple rib fx's and probable sternal fx 3/29 Doppler legs>>no DVT in either Rt or Lt legs 4/1 Bronchoscopy>>atypical cells in cytology from RUL 4/4 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 - needing sitter, periodic restlessness, partial orientation only  Seen by speech - ok for d1 diet  RN reports no resp distress but noticed to have paradoxical respiration; worse supine, better sitting   OBJECTIVE:  Blood pressure 120/54, pulse 121, temperature 98.9 F (37.2 C), temperature source Oral, resp. rate 20, height 5\' 5"  (1.651 m), weight 80.7 kg (177 lb 14.6 oz), SpO2 98.00%. Wt Readings from Last 3 Encounters:  02/08/12 80.7 kg (177 lb 14.6 oz)   Body mass index is 29.61 kg/(m^2).  I/O last 3 completed shifts: In: 1080 [P.O.:180; IV Piggyback:900] Out: 178 [Urine:176; Stool:2]     Physical Exam: General -  Chronically  unwell looking HEENT - Trach site clean Cardiac - s1s2 irregular,tachy,  no murmur Chest - decreased depth inspiration, pops and squeaks in bases.  No true wheezing. Paradoxical Respiration + - worse supine, improved but still present sitting Abd - soft, nontender, + bowel  sounds Ext - no edema Neuro - Alert but confused. RASS +1  CBC    Component Value Date/Time   WBC 13.0* 02/10/2012 0400   RBC 3.19* 02/10/2012 0400   HGB 9.2* 02/10/2012 0400   HCT 29.4* 02/10/2012 0400   PLT 346 02/10/2012 0400   MCV 92.2 02/10/2012 0400   MCH 28.8 02/10/2012 0400   MCHC 31.3 02/10/2012 0400   RDW 20.2* 02/10/2012 0400   LYMPHSABS 1.0 01/11/2012 0340   MONOABS 0.7 01/11/2012 0340   EOSABS 0.0 01/11/2012 0340   BASOSABS 0.0 01/11/2012 0340    BMET    Component Value Date/Time   NA 139 02/10/2012 0400   K 3.6 02/10/2012 0400   CL 102 02/10/2012 0400   CO2 29 02/10/2012 0400   GLUCOSE 113* 02/10/2012 0400   BUN 10 02/10/2012 0400   CREATININE 1.05 02/10/2012 0400   CALCIUM 9.3 02/10/2012 0400   GFRNONAA 56* 02/10/2012 0400   GFRAA 65* 02/10/2012 0400   No results found.   No results found.  ASSESSMENT/PLAN:  Acute respiratory failure initially from Pneumococcal PNA/bacteremia, and E coli PNA.  Multiple rib fx after CPR.  Now with tracheomalacia limiting ability to proceed with extubation, thus requiring trach.  Has hx of asthma.    - On 4/21 - self decannulated trach. Maintaining respiratory status but paradoxical  PLAN  - NAsal cannula oxygen  - remains at high risk for resp distress due to tracheomalacia; will need to sort out expectant approach v re-trach electively. Monitor for now  - Have asked Trach team to give opinion on need for re-trach --prn bronchodilators   ID --Unexplained fever,Low grade fever 99.4 continues but wc improving  Lab 02/06/12 0350  PROCALCITON 0.14    Lab 02/10/12 0400 02/06/12 0350  WBC 13.0* 13.7*   PLAN -dc flagyl 02/11/12  (unasyn has already been  stopped)  Dysphagia -Swallow eval done 4/19 and 4/22>>Dysphagia 1 puree with honey thick liquid   Agitated delirium/ anoxic encephalopathy - wonder if this is her new baseline. S/p haldol 4/22 (10mg ) with good response - increase  risperdal to 1 mg - titrate up as needed  - get neuro consultation -prn fentanyl   HTN, tachycardia -continue catapres -metoprolol 75 bid, use IV prn   GLOBAL To SNF rehab after getting neuro opinion and taking decsion on trach. dtr will need to be updated at some point   Dr. Kalman Shan, M.D., [facility].C.P Pulmonary and Critical Care Medicine Staff Physician [facility] [place] [facility] Pager: [phone], If no answer or between  15:00h - 7:00h: call [phone]  02/11/2012 8:51 AM

## 2012-02-11 NOTE — Progress Notes (Signed)
Clinical Social Worker met with pt and dtr at bedside; pt currently confused.  CSW provided emotional support and reviewed current plan of care.  CSW addressed dtr's current concerns.  At this time, [facility] is the only facility that has offered a bed for pt.  Dtr would like additional options.  CSW reviewed SNF process and pt's rights regarding SNF beds.   Dtr requested additional information on HCPOA.  CSW provided paperwork and explained that HCPOA can not be notarized until pt is alert and oriented x3.    CSW to continue to follow and assist as needed.  Angelia Mould, MSW, [place] [phone]

## 2012-02-11 NOTE — Plan of Care (Signed)
Problem: Phase III Progression Outcomes Goal: Activity at appropriate level-compared to baseline (UP IN CHAIR FOR HEMODIALYSIS)  Outcome: Progressing Up in chair x1 this am 02/11/12 kcrn

## 2012-02-11 NOTE — Progress Notes (Signed)
Speech Language Pathology Dysphagia Treatment  Patient Details Name: Emily Harrington MRN: [id number] DOB: 01-11-1951 Today's Date: 02/11/2012  SLP Assessment/Plan/Recommendation Assessment / Recommendations / Plan Clinical Impression Statement: Pt tolerating current diet, demonstrating advances in mentation, sustained attention to POs, timing of swallow. Pt able to begin taking cup sips with full supervision. SLP will f/u in one day for trials of upgraded liquid textures.  Continue with Current Diet: Dysphagia 1 (puree);Honey-thick liquid Liquids provided via: Cup Medication Administration: Crushed with puree Supervision: Patient able to self feed;Full supervision/cueing for compensatory strategies Compensations: Slow rate;Small sips/bites Postural Changes and/or Swallow Maneuvers: Seated upright 90 degrees;Upright 30-60 min after meal Oral Care Recommendations: Oral care BID Plan: Continue with current plan of care Swallowing Goals  SLP Swallowing Goals Patient will consume recommended diet without observed clinical signs of aspiration with: Moderate assistance Swallow Study Goal #1 - Progress: Progressing toward goal Patient will utilize recommended strategies during swallow to increase swallowing safety with: Moderate assistance Swallow Study Goal #2 - Progress: Progressing toward goal  General Temperature Spikes Noted: Yes Respiratory Status: Supplemental O2 delivered via (comment) Behavior/Cognition: Alert;Cooperative;Pleasant mood Oral Cavity - Dentition: Dentures, top;Dentures, not available Patient Positioning: Upright in bed  Oral Cavity - Oral Hygiene Does patient have any of the following "at risk" factors?: Diet - patient on thickened liquids;Other - dysphagia;Oxygen therapy - cannula, mask, simple oxygen devices   Dysphagia Treatment Treatment focused on: Skilled observation of diet tolerance;Patient/family/caregiver education;Utilization of compensatory  strategies Family/Caregiver Educated: daughter Treatment Methods/Modalities: Skilled observation Patient observed directly with PO's: Yes Type of PO's observed: Honey-thick liquids Feeding: Able to feed self Liquids provided via: Cup Type of cueing: Verbal Amount of cueing: Minimal  Harlon Ditty, MA CCC-SLP [phone]  Claudine Mouton 02/11/2012, 2:19 PM

## 2012-02-11 NOTE — Progress Notes (Signed)
The patient has black spots on toes. Her pedal pulse is 2+, and her extremity is warm. Will continue to monitor.

## 2012-02-12 ENCOUNTER — Inpatient Hospital Stay (HOSPITAL_COMMUNITY): Payer: Medicaid Other

## 2012-02-12 LAB — CBC
Hemoglobin: 9.6 g/dL — ABNORMAL LOW (ref 12.0–15.0)
MCH: 28.8 pg (ref 26.0–34.0)
Platelets: 303 10*3/uL (ref 150–400)
RBC: 3.33 MIL/uL — ABNORMAL LOW (ref 3.87–5.11)
WBC: 9.4 10*3/uL (ref 4.0–10.5)

## 2012-02-12 LAB — COMPREHENSIVE METABOLIC PANEL
AST: 21 U/L (ref 0–37)
BUN: 7 mg/dL (ref 6–23)
CO2: 32 mEq/L (ref 19–32)
Calcium: 9.9 mg/dL (ref 8.4–10.5)
Creatinine, Ser: 0.97 mg/dL (ref 0.50–1.10)
GFR calc non Af Amer: 61 mL/min — ABNORMAL LOW (ref >90)

## 2012-02-12 LAB — GLUCOSE, CAPILLARY
Glucose-Capillary: 135 mg/dL — ABNORMAL HIGH (ref 70–99)
Glucose-Capillary: 140 mg/dL — ABNORMAL HIGH (ref 70–99)
Glucose-Capillary: 120 mg/dL — ABNORMAL HIGH (ref 70–99)
Glucose-Capillary: 168 mg/dL — ABNORMAL HIGH (ref 70–99)

## 2012-02-12 MED ORDER — ACETAMINOPHEN 650 MG RE SUPP
650.0000 mg | Freq: Four times a day (QID) | RECTAL | Status: DC | PRN
  Administered 2012-02-12: 650 mg via RECTAL
  Filled 2012-02-12: qty 1

## 2012-02-12 MED ORDER — HALOPERIDOL LACTATE 5 MG/ML IJ SOLN
4.0000 mg | Freq: Four times a day (QID) | INTRAMUSCULAR | Status: DC
  Administered 2012-02-12 – 2012-02-13 (×2): 4 mg via INTRAVENOUS
  Filled 2012-02-12 (×9): qty 0.8

## 2012-02-12 NOTE — Progress Notes (Addendum)
Emily Harrington is a 61 y.o. female admitted on 01/08/2012 with Pneumococcal bacteremia, PEA arrest.  Transitioned to SDU 3/23.  Developed increasing dyspnea, and change in mental status and transfer back to ICU 3/30. Tracheostomy on 4/9 & off vent since 4/13  Line/tube: (Truncated 4/8) ETT 3/30>>4/4, ETT 4/4>>4/9,Trach (DF) 4/9>> pulled out 02/09/12 am.  Lt IJ CVL 3/30>>4/8 Rt PICC 4/8>>   Cultures: (Truncated 4/8) Blood 3/20>>Pneumococcus Sputum 3/20>>E coli BAL 4/1>>Candida C diff 4/2>>negative Blood 4/14 >>ng resp 4/14 >>candida  Recent Results (from the past 240 hour(s))  CULTURE, BLOOD (ROUTINE X 2)     Status: Normal   Collection Time   02/02/12 10:08 AM      Component Value Range Status Comment   Specimen Description BLOOD LEFT ARM   Final    Special Requests BOTTLES DRAWN AEROBIC AND ANAEROBIC 10CC   Final    Culture  Setup Time [id number]   Final    Culture NO GROWTH 5 DAYS   Final    Report Status 02/08/2012 FINAL   Final   CULTURE, BLOOD (ROUTINE X 2)     Status: Normal   Collection Time   02/02/12 10:15 AM      Component Value Range Status Comment   Specimen Description BLOOD LEFT HAND   Final    Special Requests BOTTLES DRAWN AEROBIC ONLY 10CC   Final    Culture  Setup Time [id number]   Final    Culture NO GROWTH 5 DAYS   Final    Report Status 02/08/2012 FINAL   Final   CULTURE, RESPIRATORY     Status: Normal   Collection Time   02/02/12  5:30 PM      Component Value Range Status Comment   Specimen Description TRACHEAL ASPIRATE   Final    Special Requests NONE   Final    Gram Stain     Final    Value: FEW WBC PRESENT,BOTH PMN AND MONONUCLEAR     FEW SQUAMOUS EPITHELIAL CELLS PRESENT     FEW GRAM POSITIVE COCCI IN PAIRS   Culture MODERATE CANDIDA ALBICANS   Final    Report Status 02/05/2012 FINAL   Final      Antibiotics: (Truncated 4/8) Zosyn 3/30>>4/8 Vancomycin 3/30>>4/3 unasyn 4/14 (? Aspn, fever, wc) >>4/19 (planned) Flagyl 4/14 (c.diff ) >  4/21  Anti-infectives     Start     Dose/Rate Route Frequency Ordered Stop   02/07/12 2300   metroNIDAZOLE (FLAGYL) IVPB 500 mg  Status:  Discontinued        500 mg 100 mL/hr over 60 Minutes Intravenous 3 times per day 02/07/12 2138 02/11/12 0851   02/02/12 2000   Ampicillin-Sulbactam (UNASYN) 3 g in sodium chloride 0.9 % 100 mL IVPB  Status:  Discontinued        3 g 100 mL/hr over 60 Minutes Intravenous Every 6 hours 02/02/12 1931 02/07/12 1149   02/02/12 1000   metroNIDAZOLE (FLAGYL) 50 mg/ml oral suspension 500 mg  Status:  Discontinued        500 mg Oral 3 times daily 02/02/12 0937 02/07/12 2138   01/22/12 1800   vancomycin (VANCOCIN) 750 mg in sodium chloride 0.9 % 150 mL IVPB  Status:  Discontinued        750 mg 150 mL/hr over 60 Minutes Intravenous Every 24 hours 01/22/12 1351 01/23/12 0909   01/18/12 1300   vancomycin (VANCOCIN) IVPB 1000 mg/200 mL premix  Status:  Discontinued  1,000 mg 200 mL/hr over 60 Minutes Intravenous Every 24 hours 01/18/12 1215 01/22/12 1350   01/18/12 1300   piperacillin-tazobactam (ZOSYN) IVPB 3.375 g  Status:  Discontinued        3.375 g 12.5 mL/hr over 240 Minutes Intravenous Every 8 hours 01/18/12 1215 01/27/12 1026   01/13/12 1000   levofloxacin (LEVAQUIN) tablet 250 mg  Status:  Discontinued        250 mg Oral Daily 01/12/12 1150 01/18/12 1145   01/12/12 1200   levofloxacin (LEVAQUIN) tablet 500 mg        500 mg Oral  Once 01/12/12 1135 01/12/12 1250   01/12/12 1200   levofloxacin (LEVAQUIN) tablet 250 mg  Status:  Discontinued        250 mg Oral Daily 01/12/12 1149 01/12/12 1150   01/09/12 1100   Levofloxacin (LEVAQUIN) IVPB 750 mg  Status:  Discontinued        750 mg 100 mL/hr over 90 Minutes Intravenous Every 48 hours 01/09/12 1028 01/12/12 1133   01/09/12 1000   vancomycin (VANCOCIN) IVPB 1000 mg/200 mL premix  Status:  Discontinued        1,000 mg 200 mL/hr over 60 Minutes Intravenous Every 24 hours 01/09/12 1403 01/11/12  0956   01/08/12 2300   piperacillin-tazobactam (ZOSYN) IVPB 3.375 g  Status:  Discontinued        3.375 g 12.5 mL/hr over 240 Minutes Intravenous Every 8 hours 01/08/12 1659 01/11/12 0956   01/08/12 1800   vancomycin (VANCOCIN) 1,500 mg in sodium chloride 0.9 % 500 mL IVPB  Status:  Discontinued        1,500 mg 250 mL/hr over 120 Minutes Intravenous Every 12 hours 01/08/12 1659 01/09/12 1403   01/08/12 1700  piperacillin-tazobactam (ZOSYN) IVPB 3.375 g       3.375 g 100 mL/hr over 30 Minutes Intravenous  Once 01/08/12 1659 01/08/12 2043           Tests/events: 3/20 Echo>>EF 55 to 60%, mild LVH 3/20 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 3/20 CT abd/pelvis>>Large Rt renal pelvis stone, avascular necrosis Lt femoral head 3/22 EEG>>non-specific slow rhythm, no epileptiform activity 3/27 Renal u/s>>Large Rt renal pelvis stone w/o hydronephrosis 3/29 CT chest>>decrease RUL ASD, patchy LUL ASD/ATX, b/l effusions, multiple rib fx's and probable sternal fx 3/29 Doppler legs>>no DVT in either Rt or Lt legs 4/1 Bronchoscopy>>atypical cells in cytology from RUL 4/4 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 - needing sitter, periodic restlessness, partial orientation only but current sitter feels improved since severl days ago  Resp distress somewwhat better  Seen by neur: MRI ordered  Still febrile 101F,. CXR stil has RUL opacity     OBJECTIVE:  Blood pressure 139/64, pulse 135, temperature 99.1 F (37.3 C), temperature source Oral, resp. rate 21, height 5\' 5"  (1.651 m), weight 80.7 kg (177 lb 14.6 oz), SpO2 100.00%. Wt Readings from Last 3 Encounters:  02/08/12 80.7 kg (177 lb 14.6 oz)   Body mass index is 29.61 kg/(m^2).  I/O last 3 completed shifts: In: 720 [P.O.:720] Out: 375  [Urine:375]     Physical Exam: General -  Chronically unwell looking HEENT - Trach site clean Cardiac - s1s2 irregular,tachy,  no murmur Chest - decreased depth inspiration, pops and squeaks in bases.  No true wheezing. Paradoxical Respiration + - worse supine, improved but still  present sitting Abd - soft, nontender, + bowel sounds Ext - no edema Neuro - Alert but confused. RASS +1. CAM-ICU posiotive for delirium  CBC    Component Value Date/Time   WBC 13.0* 02/10/2012 0400   RBC 3.19* 02/10/2012 0400   HGB 9.2* 02/10/2012 0400   HCT 29.4* 02/10/2012 0400   PLT 346 02/10/2012 0400   MCV 92.2 02/10/2012 0400   MCH 28.8 02/10/2012 0400   MCHC 31.3 02/10/2012 0400   RDW 20.2* 02/10/2012 0400   LYMPHSABS 1.0 01/11/2012 0340   MONOABS 0.7 01/11/2012 0340   EOSABS 0.0 01/11/2012 0340   BASOSABS 0.0 01/11/2012 0340    BMET    Component Value Date/Time   NA 139 02/10/2012 0400   K 3.6 02/10/2012 0400   CL 102 02/10/2012 0400   CO2 29 02/10/2012 0400   GLUCOSE 113* 02/10/2012 0400   BUN 10 02/10/2012 0400   CREATININE 1.05 02/10/2012 0400   CALCIUM 9.3 02/10/2012 0400   GFRNONAA 56* 02/10/2012 0400   GFRAA 65* 02/10/2012 0400   Dg Chest Port 1 View  02/12/2012  *RADIOLOGY REPORT*  Clinical Data: Right upper lobe loculated effusion.  PORTABLE CHEST - 1 VIEW  Comparison: 02/07/2012.  Findings: Wedge-shaped consolidation peripheral aspect right upper lobe remains.  It is possible this represents residua of pneumonia although neoplasm or result of pulmonary embolus not excluded.  Right central line tip mid superior vena cava level.  No gross pneumothorax.  Rotation to the right.  Central pulmonary vascular prominence. Slightly tortuous aorta.  Mild cardiomegaly.  Tracheostomy tube has been removed.  IMPRESSION: Tracheostomy tube has been removed.  Persistent wedge-shaped opacity peripheral aspect right upper lobe as noted above.  Original Report Authenticated By: Fuller Canada, M.D.     Dg Chest  Port 1 View  02/12/2012  *RADIOLOGY REPORT*  Clinical Data: Right upper lobe loculated effusion.  PORTABLE CHEST - 1 VIEW  Comparison: 02/07/2012.  Findings: Wedge-shaped consolidation peripheral aspect right upper lobe remains.  It is possible this represents residua of pneumonia although neoplasm or result of pulmonary embolus not excluded.  Right central line tip mid superior vena cava level.  No gross pneumothorax.  Rotation to the right.  Central pulmonary vascular prominence. Slightly tortuous aorta.  Mild cardiomegaly.  Tracheostomy tube has been removed.  IMPRESSION: Tracheostomy tube has been removed.  Persistent wedge-shaped opacity peripheral aspect right upper lobe as noted above.  Original Report Authenticated By: Fuller Canada, M.D.    ASSESSMENT/PLAN:  Acute respiratory failure initially from Pneumococcal PNA/bacteremia, and E coli PNA.  Multiple rib fx after CPR.  Now with tracheomalacia limiting ability to proceed with extubation, thus requiring trach.  Has hx of asthma.    - On 4/21 - self decannulated trach. Maintaining respiratory status but paradoxical  - In 4/24 - resp status status quo. Mildly paradoxical when supiine. No distress. CXR - persistent RUL opactity +  PLAN  - NAsal cannula oxygen  - remains at high risk for resp distress due to tracheomalacia; but given 3 day stability since decannulation: wil hold off on trach -  --prn bronchodilators - Get CT chest to better ID RUL opacity  ID --Unexplained fever,Low grade fever 100F persists.  Lab 02/06/12 0350  PROCALCITON 0.14    Lab 02/10/12 0400 02/06/12 0350  WBC 13.0* 13.7*   CXR  - RUL opaicty perssits 35 days since admit  PLAN -ct chest -> if empyema - consider VATS  Dysphagia -Swallow eval done 4/19  and 4/22>>Dysphagia 1 puree with honey thick liquid   Agitated delirium/ anoxic encephalopathy - wonder if this is her new baseline. S/p haldol 4/22 (10mg ) with good response. Neuro consult 02/11/12 -   risperdal scheduled 1mg  daily  - add haldol scheduled IV on 02/12/12  - get MRI per neuro  - appreciate neuro input   HTN, tachycardia -continue catapres -metoprolol 75 bid, use IV prn  - dc kvo fluids on 02/12/12  GLOBAL To SNF rehab after getting neuro workup complete and sorting fever out with CT chest. Need to update daughter; left message to call unit RN back   Dr. Kalman Shan, M.D., [facility].C.P Pulmonary and Critical Care Medicine Staff Physician [facility] [place] [facility] Pager: [phone], If no answer or between  15:00h - 7:00h: call [phone]  02/12/2012 9:34 AM

## 2012-02-12 NOTE — Progress Notes (Signed)
Speech Language Pathology Dysphagia Treatment  Patient Details Name: Emily Harrington MRN: [id number] DOB: 01/11/1951 Today's Date: 02/12/2012  SLP Assessment/Plan/Recommendation Assessment / Recommendations / Plan Clinical Impression Statement: Pt continues with slowly improved mental status; quite fatigued after therapies and several procedures this am.  Consumed honey-thick liquids with overall adequate toleration, + attention to bolus, multiple swallows per bolus with reduced laryngeal elevation per palpation.  Min verbal cues required for safety with POs.  Pt ready for repeat objective swallow study to determine appropriateness to advance diet (particularly given hx poor sensation of penetration and aspiration.) Continue with Current Diet: Dysphagia 1 (puree);Honey-thick liquid Liquids provided via: Cup Medication Administration: Crushed with puree Supervision: Patient able to self feed;Full supervision/cueing for compensatory strategies Compensations: Slow rate;Small sips/bites Postural Changes and/or Swallow Maneuvers: Seated upright 90 degrees;Upright 30-60 min after meal Oral Care Recommendations: Oral care BID Plan: Continue with current plan of care Swallowing Goals  SLP Swallowing Goals Swallow Study Goal #1 - Progress: Progressing toward goal Swallow Study Goal #2 - Progress: Progressing toward goal Swallow Study Goal #3 - Progress: Met  General Temperature Spikes Noted: No Respiratory Status: Supplemental O2 delivered via (comment) Behavior/Cognition: Alert;Cooperative;Pleasant mood Oral Cavity - Dentition: Dentures, top;Dentures, not available Patient Positioning: Upright in bed   Dysphagia Treatment Treatment focused on: Skilled observation of diet tolerance;Utilization of compensatory strategies Treatment Methods/Modalities: Skilled observation Patient observed directly with PO's: Yes Type of PO's observed: Honey-thick liquids Feeding: Needs assist Liquids  provided via: Cup Oral Phase Signs & Symptoms: Prolonged oral phase Pharyngeal Phase Signs & Symptoms: Delayed cough Type of cueing: Verbal Amount of cueing: Minimal   Judd Mccubbin L. Samson Frederic, [place] CCC/SLP Pager [phone]   Blenda Mounts Laurice 02/12/2012, 1:03 PM

## 2012-02-12 NOTE — Progress Notes (Signed)
Occupational Therapy Treatment Patient Details Name: Emily Harrington MRN: [id number] DOB: 05-21-1951 Today's Date: 02/12/2012 Time: 4098-1191 OT Time Calculation (min): 23 min  OT Assessment / Plan / Recommendation Comments on Treatment Session Pt limited this session by anxiety. Upon correction by the therapist (pt running into objects with RW), pt became anxious and insisited on sitting down with no regard for safety (as chair was not behind patient). Able to attempt ambulation again after pt had calmed down.    Follow Up Recommendations  Skilled nursing facility    Equipment Recommendations  Defer to next venue    Frequency     Plan Discharge plan remains appropriate    Precautions / Restrictions Precautions Precautions: Fall Restrictions Weight Bearing Restrictions: No   Pertinent Vitals/Pain Pt denies any pain at this time    ADL  Grooming: Performed;Brushing hair;Set up;Minimal assistance Where Assessed - Grooming: Supported sitting Toilet Transfer: Performed;Minimal Dentist Method: Surveyor, minerals: Set designer - Clothing Manipulation: Performed;+1 Total assistance Where Assessed - Glass blower/designer Manipulation: Standing Toileting - Hygiene: Performed;+1 Total assistance Where Assessed - Toileting Hygiene: Sit to stand from 3-in-1 or toilet Equipment Used: Rolling walker Ambulation Related to ADLs: +2total A(pt=80-85%) with RW ambulation. Pt requires assist to navigate around objects, as pt wil run into them with no attempt to avoid. Pt would allow RLE to cross over LLE at times and became very anxious and had to sit for ~64min when therapist encouraged pt to attempt to navigate around obstacles. Pt began having BM in hallway, sat in chair and returned to room then transfered to 3n1    OT Goals ADL Goals ADL Goal: Grooming - Progress: Progressing toward goals Pt Will Transfer to Toilet: with  supervision;Ambulation;with DME;3-in-1 Miscellaneous OT Goals Miscellaneous OT Goal #1: Pt will follow simple one step commands with 80 % accuracy. OT Goal: Miscellaneous Goal #1 - Progress: Progressing toward goals  Visit Information  Last OT Received On: 02/12/12 Assistance Needed: +2 PT/OT Co-Evaluation/Treatment: Yes               Cognition  Overall Cognitive Status: Impaired Area of Impairment: Attention;Following commands;Safety/judgement;Awareness of errors;Awareness of deficits;Problem solving Following Commands: Follows one step commands inconsistently Safety/Judgement: Decreased awareness of safety precautions;Decreased safety judgement for tasks assessed;Decreased awareness of need for assistance;Impulsive Safety/Judgement - Other Comments: Poor safety awareness with all tasks. Awareness of Errors: Assistance required to identify errors made;Assistance required to correct errors made    Mobility Bed Mobility Supine to Sit: 4: Min assist;With rails;HOB flat Sitting - Scoot to Edge of Bed: 5: Supervision;With rail   Exercises    Balance    End of Session OT - End of Session Equipment Utilized During Treatment: Gait belt Activity Tolerance:  (pt limited by anxiety) Patient left: in chair;with call bell/phone within reach (with sitter) Nurse Communication: Mobility status   Max Nuno 02/12/2012, 11:08 AM

## 2012-02-12 NOTE — Progress Notes (Signed)
Physical Therapy Treatment Patient Details Name: Emily Harrington MRN: [id number] DOB: 1950/12/27 Today's Date: 02/12/2012 Time: 4696-2952 PT Time Calculation (min): 23 min  PT Assessment / Plan / Recommendation Comments on Treatment Session  Patient admitted for PEA arrest.  Multiple complications with VDRF.  Now decannulated and on O2.  Patient is progressing with ambulation and strength.  Confusion limits PT.      Follow Up Recommendations  Skilled nursing facility;Supervision/Assistance - 24 hour    Equipment Recommendations  Defer to next venue    Frequency Min 3X/week   Plan Discharge plan remains appropriate;Frequency remains appropriate    Precautions / Restrictions Precautions Precautions: Fall Restrictions Weight Bearing Restrictions: No   Pertinent Vitals/Pain VSS/ No pain    Mobility  Bed Mobility Bed Mobility: Rolling Right;Right Sidelying to Sit Rolling Right: 5: Supervision;With rail Right Sidelying to Sit: With rails;4: Min assist;HOB flat Supine to Sit: 4: Min assist;With rails;HOB flat Sitting - Scoot to Edge of Bed: 5: Supervision Sit to Supine: Not Tested (comment) Details for Bed Mobility Assistance: Needed motivation to get to EOB.   Transfers Transfers: Sit to Stand;Stand to Sit Sit to Stand: 1: +2 Total assist;With upper extremity assist;With armrests;From bed Sit to Stand: Patient Percentage: 80% Stand to Sit: 1: +2 Total assist;With upper extremity assist;With armrests;To chair/3-in-1 Stand to Sit: Patient Percentage: 80% Stand Pivot Transfers: Not tested (comment) Squat Pivot Transfers: Not tested (comment) Details for Transfer Assistance: Patient needed cues for hand placement.  Patient also needs physical assist and facilitation due to decr processing vs. delayed processing.  Patient stood fully upright but has difficutly achieving full upright postural control..Ambulated 10 feet and then 35 feet with a rest break in between.     Ambulation/Gait Ambulation/Gait Assistance: 1: +2 Total assist Ambulation/Gait: Patient Percentage: 80 Ambulation Distance (Feet): 45 Feet Assistive device: Rolling walker Ambulation/Gait Assistance Details: Needed max cues and assist to steer RW and sequence steps.   Gait Pattern: Step-to pattern;Shuffle;Trunk flexed Stairs: No Wheelchair Mobility Wheelchair Mobility: No         PT Goals Acute Rehab PT Goals Time For Goal Achievement: 02/12/12 Potential to Achieve Goals: Good PT Goal: Supine/Side to Sit - Progress: Progressing toward goal PT Goal: Sit at Edge Of Bed - Progress: Progressing toward goal PT Goal: Sit to Supine/Side - Progress: Progressing toward goal PT Goal: Sit to Stand - Progress: Progressing toward goal PT Goal: Stand to Sit - Progress: Progressing toward goal PT Transfer Goal: Bed to Chair/Chair to Bed - Progress: Progressing toward goal PT Goal: Stand - Progress: Progressing toward goal PT Goal: Ambulate - Progress: Progressing toward goal  Visit Information  Last PT Received On: 02/12/12 Assistance Needed: +2 PT/OT Co-Evaluation/Treatment: Yes    Subjective Data  Subjective: Patient still confused.     Cognition  Overall Cognitive Status: Impaired Area of Impairment: Attention;Following commands;Safety/judgement;Awareness of errors;Awareness of deficits Arousal/Alertness: Awake/alert Orientation Level: Person Behavior During Session: Anxious Current Attention Level: Focused Attention - Other Comments: up to 2 minutes Following Commands: Follows one step commands inconsistently Safety/Judgement: Decreased awareness of safety precautions;Decreased safety judgement for tasks assessed;Impulsive Safety/Judgement - Other Comments: Poor safety awareness continues Awareness of Errors: Assistance required to identify errors made;Assistance required to correct errors made    Balance  Static Sitting Balance Static Sitting - Balance Support: Feet  supported;No upper extremity supported Static Sitting - Level of Assistance: 7: Independent Static Sitting - Comment/# of Minutes: 4 Static Standing Balance Static Standing - Balance Support: Bilateral upper  extremity supported;During functional activity Static Standing - Level of Assistance: 5: Stand by assistance Static Standing - Comment/# of Minutes: 2 minutes with good stability  End of Session PT - End of Session Equipment Utilized During Treatment: Gait belt Activity Tolerance: Patient limited by fatigue Patient left: in chair;with call bell/phone within reach;with nursing in room Nurse Communication: Mobility status    INGOLD,Branae Crail 02/12/2012, 11:23 AM  [facility] [phone] (pager)

## 2012-02-12 NOTE — Procedures (Signed)
EEG NUMBER:  REFERRING PHYSICIAN:  Dr. Katrinka Blazing.  HISTORY:  A 61 year old female after PEA arrest.  Now with confusion.  MEDICATIONS:  Aspirin, clonidine, Lovenox, Haldol, NovoLog, Lopressor, Risperdal.  CONDITIONS OF RECORDINGS:  This is a 16-channel EEG carried out with the patient in the awake, drowsy, and a sleep states.  DESCRIPTION:  The waking background activity consists of a low-voltage, symmetrical, fairly well-organized 8 Hz alpha activity seen from the parieto-occipital and posterotemporal regions.  Low-voltage, fast activity, poorly organized was seen anteriorly at times, superimposed on more posterior rhythms.  A mixture of beta and alpha rhythms seen from the central and temporal regions.  The patient drowses with slowing to irregular voltage theta and beta activity.  The patient goes into a light sleep with symmetrical sleep spindles.  The vertex was a sharp activity and irregular slow activity.  Frequently during the tracing is noted a sharp activity over the left hemisphere with phase reversal at [facility].  Artifact is noted frequently during the tracing as well. Hypoventilation was not performed.  Intermittent photic stimulation failed to elicit any change in the tracing.  IMPRESSION:  This is an abnormal EEG secondary to sharp activity over the left hemisphere with phase reversal at F8.  This finding is consistent with the focal disturbance with epileptogenic potential.          ______________________________ Thana Farr, MD    [id number] D:  02/12/2012 17:38:11  T:  02/12/2012 20:46:47  Job #:  [id number]

## 2012-02-12 NOTE — Progress Notes (Signed)
[facility] PROGRESS NOTE    SUBJECTIVE   Patient is presently hooked up to EEG.  She is unable to tell me where she is, believes it is May but able to tell me the year is 2013.  She refuses to answer many of my questions and has a hard time staying still.  OBJECTIVE   Vital signs in last 24 hours: Temp:  [99.1 F (37.3 C)-101 F (38.3 C)] 99.1 F (37.3 C) (04/24 0642) Pulse Rate:  [101-135] 135  (04/24 0445) Resp:  [18-24] 21  (04/24 0445) BP: (113-139)/(62-78) 139/64 mmHg (04/24 0445) SpO2:  [93 %-100 %] 100 % (04/24 0445)  Intake/Output from previous day: 04/23 0701 - 04/24 0700 In: 720 [P.O.:720] Out: 200 [Urine:200] Intake/Output this shift:   Nutritional status: Dysphagia  Past Medical History  Diagnosis Date  . Asthma     Neurologic Exam:  Mental Status: She alert, unable to tell me where she is, believes it is May but able to tell me the year is 2013.   Speech fluent without evidence of aphasia. Able to follow simple commands without difficulty. Cranial Nerves: II-Visual fields grossly intact. III/IV/VI-Extraocular movements intact.  Pupils reactive bilaterally. V/VII-Smile symmetric VIII-grossly intact IX/X-normal gag XI-bilateral shoulder shrug XII-midline tongue extension Motor: 5/5 bilaterally with normal tone and bulk Sensory: Pinprick and light touch intact throughout, bilaterally Deep Tendon Reflexes: 1+ and symmetric throughout Plantars: Downgoing bilaterally Cerebellar: Normal finger-to-nose, normal heel-to-shin test.     Lab Results: No results found for this basename: cbc, bmp, coags, chol, tri, ldl, hga1c   Lipid Panel No results found for this basename: CHOL,TRIG,HDL,CHOLHDL,VLDL,LDLCALC in the last 72 hours  Studies/Results: Dg Chest Port 1 View  02/12/2012  *RADIOLOGY REPORT*  Clinical Data: Right upper lobe loculated effusion.  PORTABLE CHEST - 1 VIEW  Comparison: 02/07/2012.   Findings: Wedge-shaped consolidation peripheral aspect right upper lobe remains.  It is possible this represents residua of pneumonia although neoplasm or result of pulmonary embolus not excluded.  Right central line tip mid superior vena cava level.  No gross pneumothorax.  Rotation to the right.  Central pulmonary vascular prominence. Slightly tortuous aorta.  Mild cardiomegaly.  Tracheostomy tube has been removed.  IMPRESSION: Tracheostomy tube has been removed.  Persistent wedge-shaped opacity peripheral aspect right upper lobe as noted above.  Original Report Authenticated By: Fuller Canada, M.D.    Medications:     Scheduled:   . antiseptic oral rinse  15 mL Mouth Rinse QID  . aspirin EC  162 mg Oral Daily  . chlorhexidine  15 mL Mouth Rinse BID  . cloNIDine  0.1 mg Transdermal Weekly  . enoxaparin (LOVENOX) injection  40 mg Subcutaneous Q24H  . feeding supplement  1 Container Oral TID WC  . Gerhardt's butt cream   Topical BID  . haloperidol lactate  4 mg Intravenous Q6H  . insulin aspart  0-15 Units Subcutaneous Q4H  . metoprolol  5 mg Intravenous Q6H  . nystatin   Topical TID  . pantoprazole sodium  40 mg Per Tube Q1200  . risperiDONE  1 mg Oral Daily  . sodium chloride  10-40 mL Intracatheter Q12H    Assessment/Plan:   61 YO female who was brought to ED after PEA arrest and found to have  E. Coli and Pneumococcal PNA S/P arrest. Patient continues to show agitaion and cnofusion. Can not rule out the possibility of cerebral hypoperfusion injury. Further work up recommended. With fever and low grade temp can not rue out the possibility of infectious influence   Recommend: 1) MRI brain-PENDING 2) EEG-currently in progress 3) Continue to keep sedating medications to a minimum   Felicie Morn PA-C [facility] [phone]  02/12/2012, 11:20 AM

## 2012-02-13 ENCOUNTER — Other Ambulatory Visit: Payer: Self-pay

## 2012-02-13 LAB — GLUCOSE, CAPILLARY
Glucose-Capillary: 110 mg/dL — ABNORMAL HIGH (ref 70–99)
Glucose-Capillary: 111 mg/dL — ABNORMAL HIGH (ref 70–99)
Glucose-Capillary: 115 mg/dL — ABNORMAL HIGH (ref 70–99)

## 2012-02-13 MED ORDER — HALOPERIDOL LACTATE 5 MG/ML IJ SOLN
2.0000 mg | Freq: Four times a day (QID) | INTRAMUSCULAR | Status: DC
  Administered 2012-02-13 – 2012-02-14 (×2): 2 mg via INTRAVENOUS
  Filled 2012-02-13 (×8): qty 0.4

## 2012-02-13 MED ORDER — PREDNISONE 20 MG PO TABS
30.0000 mg | ORAL_TABLET | Freq: Every day | ORAL | Status: DC
  Administered 2012-02-14: 30 mg via ORAL
  Filled 2012-02-13 (×2): qty 1

## 2012-02-13 MED ORDER — GERHARDT'S BUTT CREAM
TOPICAL_CREAM | Freq: Two times a day (BID) | CUTANEOUS | Status: DC
  Administered 2012-02-13 – 2012-02-14 (×2): via TOPICAL

## 2012-02-13 MED ORDER — DIVALPROEX SODIUM 500 MG PO DR TAB
500.0000 mg | DELAYED_RELEASE_TABLET | Freq: Two times a day (BID) | ORAL | Status: DC
  Administered 2012-02-13 – 2012-02-14 (×2): 500 mg via ORAL
  Filled 2012-02-13 (×4): qty 1

## 2012-02-13 MED ORDER — DOXYCYCLINE HYCLATE 100 MG PO TABS
100.0000 mg | ORAL_TABLET | Freq: Two times a day (BID) | ORAL | Status: DC
  Administered 2012-02-13 – 2012-02-14 (×2): 100 mg via ORAL
  Filled 2012-02-13 (×3): qty 1

## 2012-02-13 MED ORDER — PREDNISONE 10 MG PO TABS: 10.0000 mg | ORAL_TABLET | Freq: Every day | ORAL | Status: DC

## 2012-02-13 MED ORDER — VALPROATE SODIUM 500 MG/5ML IV SOLN
500.0000 mg | INTRAVENOUS | Status: AC
  Administered 2012-02-13: 500 mg via INTRAVENOUS
  Filled 2012-02-13: qty 5

## 2012-02-13 NOTE — Progress Notes (Signed)
UR Completed.  Emily Harrington Jane [phone] 02/13/2012  

## 2012-02-13 NOTE — Progress Notes (Signed)
[facility] PROGRESS NOTE    SUBJECTIVE   Remains confused, with PT no complaints  OBJECTIVE   Vital signs in last 24 hours: Temp:  [98.9 F (37.2 C)-99.3 F (37.4 C)] 98.9 F (37.2 C) (04/25 0604) Pulse Rate:  [98-124] 124  (04/25 0604) Resp:  [20-22] 20  (04/25 0604) BP: (116-139)/(51-77) 139/77 mmHg (04/25 0604) SpO2:  [95 %-99 %] 95 % (04/25 0604)  Intake/Output from previous day: 04/24 0701 - 04/25 0700 In: 120 [P.O.:120] Out: -  Intake/Output this shift:   Nutritional status: NPO  Past Medical History  Diagnosis Date  . Asthma     Neurologic Exam:  Mental Status:  She alert, unable to tell me where she is, with PT and having difficulty following complex commands. Cannot tell me date or year. Speech fluent without evidence of aphasia. Able to follow simple commands without difficulty but not complex commands.  Cranial Nerves:  II-Visual fields grossly intact.  III/IV/VI-Extraocular movements intact. Pupils reactive bilaterally.  V/VII-Smile symmetric  VIII-grossly intact  IX/X-normal gag  XI-bilateral shoulder shrug  XII-midline tongue extension  Motor: 5/5 bilaterally with normal tone and bulk  Sensory: Pinprick and light touch intact throughout, bilaterally  Deep Tendon Reflexes: 1+ and symmetric throughout  Plantars: Downgoing bilaterally  Cerebellar: Normal finger-to-nose, normal heel-to-shin test.    Lab Results: No results found for this basename: cbc, bmp, coags, chol, tri, ldl, hga1c   Lipid Panel No results found for this basename: CHOL,TRIG,HDL,CHOLHDL,VLDL,LDLCALC in the last 72 hours  Studies/Results: Ct Chest Wo Contrast  02/12/2012  *RADIOLOGY REPORT*  Clinical Data: Right upper lobe consolidation versus empyema  CT CHEST WITHOUT CONTRAST  Technique:  Multidetector CT imaging of the chest was performed following the standard protocol without IV contrast.  Comparison: Chest radiograph dated  02/12/2012.  CT chest dated 01/17/2012 and 01/08/2012.  Findings: Wedge-shaped parenchymal opacity in the lateral right upper lobe (series 3/image 17).  This appearance is much improved from prior CTs.  Additional 4 mm nodule in the medial right upper lobe (series 3/image 17).  Mild patchy opacity in the posterior lower lobes (for example, series 3/image 33), possibly reflecting atelectasis or scarring.  No pleural effusion or pneumothorax.  The visualized thyroid is unremarkable.  The heart is normal in size.  No pericardial effusion.  Right arm PICC.  No suspicious mediastinal or axillary lymphadenopathy.  Small hiatal hernia.  Visualized upper abdomen is otherwise unremarkable.  Healing sternal fracture (sagittal image 57).  Multiple healing bilateral anterolateral rib fractures.  IMPRESSION: Wedge-shaped parenchymal opacity in the lateral right upper lobe, much improved from prior CTs.  Given lack of interval change on recent radiographs, this may reflect residual scarring rather than infection.  No pleural effusion or empyema.  Healing sternal and bilateral rib fractures.  Original Report Authenticated By: Charline Bills, M.D.   Mr Brain Wo Contrast  02/12/2012  *RADIOLOGY REPORT*  Clinical Data: Confusion.  Rule out stroke.  MRI HEAD WITHOUT CONTRAST  Technique:  Multiplanar, multiecho pulse sequences of the brain and surrounding structures were obtained according to standard protocol without intravenous contrast.  Comparison: CT 01/08/2012  Findings: Image quality degraded by patient motion.  Negative for acute infarct.  Mild atrophy and mild chronic microvascular ischemic change in the white matter.  Mild  chronic ischemia in the pons.  Negative for hemorrhage or mass.  Ventricle size is normal.  No midline shift.  Mastoid sinus effusion on the right.  Mild mucosal edema in the paranasal sinuses.  IMPRESSION: Atrophy and mild chronic microvascular ischemia.  No acute infarct.  Right mastoid sinus effusion  and mild chronic paranasal sinusitis.  Original Report Authenticated By: Camelia Phenes, M.D.   Dg Chest Port 1 View  02/12/2012  *RADIOLOGY REPORT*  Clinical Data: Right upper lobe loculated effusion.  PORTABLE CHEST - 1 VIEW  Comparison: 02/07/2012.  Findings: Wedge-shaped consolidation peripheral aspect right upper lobe remains.  It is possible this represents residua of pneumonia although neoplasm or result of pulmonary embolus not excluded.  Right central line tip mid superior vena cava level.  No gross pneumothorax.  Rotation to the right.  Central pulmonary vascular prominence. Slightly tortuous aorta.  Mild cardiomegaly.  Tracheostomy tube has been removed.  IMPRESSION: Tracheostomy tube has been removed.  Persistent wedge-shaped opacity peripheral aspect right upper lobe as noted above.  Original Report Authenticated By: Fuller Canada, M.D.    Medications:     Scheduled:   . antiseptic oral rinse  15 mL Mouth Rinse QID  . aspirin EC  162 mg Oral Daily  . chlorhexidine  15 mL Mouth Rinse BID  . cloNIDine  0.1 mg Transdermal Weekly  . divalproex  500 mg Oral Q12H  . enoxaparin (LOVENOX) injection  40 mg Subcutaneous Q24H  . feeding supplement  1 Container Oral TID WC  . Gerhardt's butt cream   Topical BID  . haloperidol lactate  2 mg Intravenous Q6H  . insulin aspart  0-15 Units Subcutaneous Q4H  . metoprolol  5 mg Intravenous Q6H  . nystatin   Topical TID  . pantoprazole sodium  40 mg Per Tube Q1200  . risperiDONE  1 mg Oral Daily  . sodium chloride  10-40 mL Intracatheter Q12H  . valproate sodium  500 mg Intravenous STAT  . DISCONTD: haloperidol lactate  4 mg Intravenous Q6H    Assessment/Plan:   61 YO female who was brought to ED after PEA arrest and found to have E. Coli and Pneumococcal PNA S/P arrest. Patient continues to show agitaion and cnofusion. Can not rule out the possibility of cerebral hypoperfusion injury.  With fever and low grade temp can not rue out the  possibility of infectious influence   MRI brain WNL  EEG- showedsharp activity over the left hemisphere with phase reversal at F8. This finding is  consistent with the focal disturbance with epileptogenic potential. No active seizure was noted however.    PLAN:--For this reason we will load with 500 mg IV Depakote and start patient on Depakote 500 mg BID.  We will follow both levels and exam tomorrow to see if mentation improves.     Felicie Morn PA-C [facility] [phone]  02/13/2012, 9:50 AM

## 2012-02-13 NOTE — Consult Note (Signed)
Emily Harrington, Emily Harrington [id number] 28-Jun-1951 Emily Milch, MD  Reason for Consult: right mastoid effusion seen on MRI  HPI: 61yo treated for PEA arrest and bacteremia/pneumonia. Had tracheotomy and ICU stay. Now improved but with some residual confusion. MRI ordered which showed a right mastoid effusion. ENT consulted for right mastoid effusion. I reviewed her CT head from 12/2011, this shows clear mastoids and middle ears with no effusion and minimal paranasal sinus inflammation. INcidentally she has a pneumatized left petrous apex. Her MRI from yesterday shows noncoalescent fluid in the right mastoid air cells on the T1 and especially T2 sequences. The mastoid septations are intact and visible on the MRI. No evidence of subperiosteal abscess is seen.  Allergies:  Allergies  Allergen Reactions  . Nsaids     ROS: Review of systems normal other than 12 systems except per HPI.  PMH:  Past Medical History  Diagnosis Date  . Asthma     FH: History reviewed. No pertinent family history.  SH:  History   Social History  . Marital Status: Married    Spouse Name: N/A    Number of Children: N/A  . Years of Education: N/A   Occupational History  . Not on file.   Social History Main Topics  . Smoking status: Never Smoker   . Smokeless tobacco: Not on file  . Alcohol Use: No  . Drug Use: No  . Sexually Active:    Other Topics Concern  . Not on file   Social History Narrative  . No narrative on file    PSH:  Past Surgical History  Procedure Date  . Cholecystectomy   . Abdominal hysterectomy     Physical  Exam: CN 2-12 grossly intact and symmetric. She is communicative but somewhat confused. Left TM is clear, the right TM is dull with an effusion. The mastoids are nontender with no fluctuance bilaterally. Oral cavity, lips, gums, oropharynx normal with no masses or lesions. Skin warm and dry. Nasal cavity without polyps or purulence. External nose and ears without masses or  lesions. EOMI, PERRLA. Neck supple with no masses or lesions. No lymphadenopathy palpated. Thyroid normal with no masses.   A/P: right noncoalescent otitis media with right mastoid effusion. I recommend treating the ear infection with Doxycycline and a prednisone taper and seeing her back as an outpatient to see if the effusion resolves. If the effusion does not resolve with medical treatment or progresses will consider tympanostomy tubes. No indication for acute surgical intervention.   Emily Harrington 02/13/2012 4:52 PM

## 2012-02-13 NOTE — Progress Notes (Signed)
Emily Harrington is a 61 y.o. female admitted on 01/08/2012 with Pneumococcal bacteremia, PEA arrest.  Transitioned to SDU 3/23.  Developed increasing dyspnea, and change in mental status and transfer back to ICU 3/30. Tracheostomy on 4/9 & off vent since 4/13  Line/tube: (Truncated 4/8) ETT 3/30>>4/4, ETT 4/4>>4/9,Trach (DF) 4/9>> pulled out 02/09/12 am.  Lt IJ CVL 3/30>>4/8 Rt PICC 4/8>>   Cultures: (Truncated 4/8) Blood 3/20>>Pneumococcus Sputum 3/20>>E coli BAL 4/1>>Candida C diff 4/2>>negative Blood 4/14 >>ng resp 4/14 >>candida  No results found for this or any previous visit (from the past 240 hour(s)).   Antibiotics: (Truncated 4/8) Zosyn 3/30>>4/8 Vancomycin 3/30>>4/3 unasyn 4/14 (? Aspn, fever, wc) >>4/19 (planned) Flagyl 4/14 (c.diff ) > 4/21  Anti-infectives     Start     Dose/Rate Route Frequency Ordered Stop   02/07/12 2300   metroNIDAZOLE (FLAGYL) IVPB 500 mg  Status:  Discontinued        500 mg 100 mL/hr over 60 Minutes Intravenous 3 times per day 02/07/12 2138 02/11/12 0851   02/02/12 2000   Ampicillin-Sulbactam (UNASYN) 3 g in sodium chloride 0.9 % 100 mL IVPB  Status:  Discontinued        3 g 100 mL/hr over 60 Minutes Intravenous Every 6 hours 02/02/12 1931 02/07/12 1149   02/02/12 1000   metroNIDAZOLE (FLAGYL) 50 mg/ml oral suspension 500 mg  Status:  Discontinued        500 mg Oral 3 times daily 02/02/12 0937 02/07/12 2138   01/22/12 1800   vancomycin (VANCOCIN) 750 mg in sodium chloride 0.9 % 150 mL IVPB  Status:  Discontinued        750 mg 150 mL/hr over 60 Minutes Intravenous Every 24 hours 01/22/12 1351 01/23/12 0909   01/18/12 1300   vancomycin (VANCOCIN) IVPB 1000 mg/200 mL premix  Status:  Discontinued        1,000 mg 200 mL/hr over 60 Minutes Intravenous Every 24 hours 01/18/12 1215 01/22/12 1350   01/18/12 1300   piperacillin-tazobactam (ZOSYN) IVPB 3.375 g  Status:  Discontinued        3.375 g 12.5 mL/hr over 240 Minutes Intravenous  Every 8 hours 01/18/12 1215 01/27/12 1026   01/13/12 1000   levofloxacin (LEVAQUIN) tablet 250 mg  Status:  Discontinued        250 mg Oral Daily 01/12/12 1150 01/18/12 1145   01/12/12 1200   levofloxacin (LEVAQUIN) tablet 500 mg        500 mg Oral  Once 01/12/12 1135 01/12/12 1250   01/12/12 1200   levofloxacin (LEVAQUIN) tablet 250 mg  Status:  Discontinued        250 mg Oral Daily 01/12/12 1149 01/12/12 1150   01/09/12 1100   Levofloxacin (LEVAQUIN) IVPB 750 mg  Status:  Discontinued        750 mg 100 mL/hr over 90 Minutes Intravenous Every 48 hours 01/09/12 1028 01/12/12 1133   01/09/12 1000   vancomycin (VANCOCIN) IVPB 1000 mg/200 mL premix  Status:  Discontinued        1,000 mg 200 mL/hr over 60 Minutes Intravenous Every 24 hours 01/09/12 1403 01/11/12 0956   01/08/12 2300   piperacillin-tazobactam (ZOSYN) IVPB 3.375 g  Status:  Discontinued        3.375 g 12.5 mL/hr over 240 Minutes Intravenous Every 8 hours 01/08/12 1659 01/11/12 0956   01/08/12 1800   vancomycin (VANCOCIN) 1,500 mg in sodium chloride 0.9 % 500 mL IVPB  Status:  Discontinued        1,500 mg 250 mL/hr over 120 Minutes Intravenous Every 12 hours 01/08/12 1659 01/09/12 1403   01/08/12 1700  piperacillin-tazobactam (ZOSYN) IVPB 3.375 g       3.375 g 100 mL/hr over 30 Minutes Intravenous  Once 01/08/12 1659 01/08/12 2043           Tests/events: 3/20 Echo>>EF 55 to 60%, mild LVH 3/20 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 3/20 CT abd/pelvis>>Large Rt renal pelvis stone, avascular necrosis Lt femoral head 3/22 EEG>>non-specific slow rhythm, no epileptiform activity 3/27 Renal u/s>>Large Rt renal pelvis stone w/o hydronephrosis 3/29 CT chest>>decrease RUL ASD, patchy LUL ASD/ATX, b/l effusions, multiple rib fx's and probable sternal fx 3/29 Doppler legs>>no DVT in either Rt or Lt legs 4/1 Bronchoscopy>>atypical cells in cytology from RUL 4/4 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 Seizure focus, MRI - right mastoid effusion and chronic sinusitis. CT chest improving RUL opacity SUBJECTIVE:  4/24 EEG - ? Seizure focus, MRI - right mastoid effusion and chronic sinusitis. CT chest improving RUL opacity Encephalopathy continues but improved after haldol - maybe a bit more sleepy and haldol held a few times. RN trying wihtout stitter.   Resp distress somewwhat better  Seen by neur: MRI ordered  Still febrile 21F  No events on tele per RN    OBJECTIVE:  Blood pressure 139/77, pulse 124, temperature 98.9 F (37.2 C), temperature source Oral, resp. rate 20, height 5\' 5"  (1.651 m), weight 80.7 kg (177 lb 14.6 oz), SpO2 95.00%. Wt Readings from Last 3 Encounters:  02/08/12 80.7 kg (177 lb 14.6 oz)   Body mass index is 29.61 kg/(m^2).  I/O last 3 completed shifts: In: 360 [P.O.:360] Out: 200 [Urine:200]     Physical Exam: General -  Chronically unwell looking HEENT - Trach site clean Cardiac - s1s2 irregular,tachy,  no murmur Chest - decreased depth inspiration, pops and squeaks in bases.  No true wheezing. Paradoxical Respiration + but improving steadily since past few days  Ab - soft, nontender, + bowel sounds Ext - no edema Neuro - Alert but confused. RASS +1. CAM-ICU posiotive for delirium  CBC    Component Value Date/Time   WBC 9.4 02/12/2012 2007   RBC 3.33* 02/12/2012 2007   HGB 9.6* 02/12/2012 2007   HCT 31.5* 02/12/2012 2007   PLT 303 02/12/2012 2007   MCV 94.6 02/12/2012 2007   MCH 28.8 02/12/2012 2007   MCHC 30.5 02/12/2012 2007   RDW 20.7* 02/12/2012 2007   LYMPHSABS 1.0 01/11/2012 0340   MONOABS 0.7 01/11/2012 0340   EOSABS 0.0 01/11/2012 0340   BASOSABS 0.0 01/11/2012 0340    BMET    Component Value Date/Time   NA 144 02/12/2012 2007   K 3.8 02/12/2012 2007   CL 106 02/12/2012 2007   CO2 32  02/12/2012 2007   GLUCOSE 166* 02/12/2012 2007   BUN 7 02/12/2012 2007   CREATININE 0.97 02/12/2012 2007   CALCIUM 9.9 02/12/2012 2007   GFRNONAA 61* 02/12/2012 2007   GFRAA 71* 02/12/2012 2007   Ct Chest Wo Contrast  02/12/2012  *RADIOLOGY REPORT*  Clinical Data: Right upper lobe consolidation versus empyema  CT CHEST WITHOUT CONTRAST  Technique:  Multidetector CT imaging of the chest was performed following the standard protocol without IV contrast.  Comparison: Chest radiograph dated 02/12/2012.  CT chest dated 01/17/2012 and 01/08/2012.  Findings: Wedge-shaped parenchymal opacity in the lateral right upper lobe (series 3/image 17).  This appearance is much improved from prior CTs.  Additional 4 mm nodule in the medial right upper lobe (series 3/image 17).  Mild patchy opacity in the posterior lower lobes (for example, series 3/image 33), possibly reflecting atelectasis or scarring.  No pleural effusion or pneumothorax.  The visualized thyroid is unremarkable.  The heart is normal in size.  No pericardial effusion.  Right arm PICC.  No suspicious mediastinal or axillary lymphadenopathy.  Small hiatal hernia.  Visualized upper abdomen is otherwise unremarkable.  Healing sternal fracture (sagittal image 57).  Multiple healing bilateral anterolateral rib fractures.  IMPRESSION: Wedge-shaped parenchymal opacity in the lateral right upper lobe, much improved from prior CTs.  Given lack of interval change on recent radiographs, this may reflect residual scarring rather than infection.  No pleural effusion or empyema.  Healing sternal and bilateral rib fractures.  Original Report Authenticated By: Charline Bills, M.D.   Mr Brain Wo Contrast  02/12/2012  *RADIOLOGY REPORT*  Clinical Data: Confusion.  Rule out stroke.  MRI HEAD WITHOUT CONTRAST  Technique:  Multiplanar, multiecho pulse sequences of the brain and surrounding structures were obtained according to standard protocol without intravenous contrast.   Comparison: CT 01/08/2012  Findings: Image quality degraded by patient motion.  Negative for acute infarct.  Mild atrophy and mild chronic microvascular ischemic change in the white matter.  Mild chronic ischemia in the pons.  Negative for hemorrhage or mass.  Ventricle size is normal.  No midline shift.  Mastoid sinus effusion on the right.  Mild mucosal edema in the paranasal sinuses.  IMPRESSION: Atrophy and mild chronic microvascular ischemia.  No acute infarct.  Right mastoid sinus effusion and mild chronic paranasal sinusitis.  Original Report Authenticated By: Camelia Phenes, M.D.   Dg Chest Port 1 View  02/12/2012  *RADIOLOGY REPORT*  Clinical Data: Right upper lobe loculated effusion.  PORTABLE CHEST - 1 VIEW  Comparison: 02/07/2012.  Findings: Wedge-shaped consolidation peripheral aspect right upper lobe remains.  It is possible this represents residua of pneumonia although neoplasm or result of pulmonary embolus not excluded.  Right central line tip mid superior vena cava level.  No gross pneumothorax.  Rotation to the right.  Central pulmonary vascular prominence. Slightly tortuous aorta.  Mild cardiomegaly.  Tracheostomy tube has been removed.  IMPRESSION: Tracheostomy tube has been removed.  Persistent wedge-shaped opacity peripheral aspect right upper lobe as noted above.  Original Report Authenticated By: Fuller Canada, M.D.     Ct Chest Wo Contrast  02/12/2012  *RADIOLOGY REPORT*  Clinical Data: Right upper lobe consolidation versus empyema  CT CHEST WITHOUT CONTRAST  Technique:  Multidetector CT imaging of the chest was performed following the standard protocol without IV contrast.  Comparison: Chest radiograph dated 02/12/2012.  CT chest dated 01/17/2012 and 01/08/2012.  Findings: Wedge-shaped parenchymal opacity in the lateral right upper lobe (series 3/image 17).  This appearance is much improved from prior CTs.  Additional 4 mm nodule in the medial right upper lobe (series 3/image 17).   Mild patchy opacity in the posterior lower lobes (for example, series 3/image 33), possibly reflecting atelectasis or scarring.  No pleural effusion or pneumothorax.  The visualized thyroid is unremarkable.  The heart is normal in size.  No pericardial effusion.  Right arm PICC.  No suspicious mediastinal or axillary lymphadenopathy.  Small hiatal hernia.  Visualized upper abdomen is otherwise  unremarkable.  Healing sternal fracture (sagittal image 57).  Multiple healing bilateral anterolateral rib fractures.  IMPRESSION: Wedge-shaped parenchymal opacity in the lateral right upper lobe, much improved from prior CTs.  Given lack of interval change on recent radiographs, this may reflect residual scarring rather than infection.  No pleural effusion or empyema.  Healing sternal and bilateral rib fractures.  Original Report Authenticated By: Charline Bills, M.D.   Mr Brain Wo Contrast  02/12/2012  *RADIOLOGY REPORT*  Clinical Data: Confusion.  Rule out stroke.  MRI HEAD WITHOUT CONTRAST  Technique:  Multiplanar, multiecho pulse sequences of the brain and surrounding structures were obtained according to standard protocol without intravenous contrast.  Comparison: CT 01/08/2012  Findings: Image quality degraded by patient motion.  Negative for acute infarct.  Mild atrophy and mild chronic microvascular ischemic change in the white matter.  Mild chronic ischemia in the pons.  Negative for hemorrhage or mass.  Ventricle size is normal.  No midline shift.  Mastoid sinus effusion on the right.  Mild mucosal edema in the paranasal sinuses.  IMPRESSION: Atrophy and mild chronic microvascular ischemia.  No acute infarct.  Right mastoid sinus effusion and mild chronic paranasal sinusitis.  Original Report Authenticated By: Camelia Phenes, M.D.   Dg Chest Port 1 View  02/12/2012  *RADIOLOGY REPORT*  Clinical Data: Right upper lobe loculated effusion.  PORTABLE CHEST - 1 VIEW  Comparison: 02/07/2012.  Findings:  Wedge-shaped consolidation peripheral aspect right upper lobe remains.  It is possible this represents residua of pneumonia although neoplasm or result of pulmonary embolus not excluded.  Right central line tip mid superior vena cava level.  No gross pneumothorax.  Rotation to the right.  Central pulmonary vascular prominence. Slightly tortuous aorta.  Mild cardiomegaly.  Tracheostomy tube has been removed.  IMPRESSION: Tracheostomy tube has been removed.  Persistent wedge-shaped opacity peripheral aspect right upper lobe as noted above.  Original Report Authenticated By: Fuller Canada, M.D.     Lab 02/12/12 2007 02/10/12 0400 02/08/12 0515 02/07/12 0459  NA 144 139 143 145  K 3.8 3.6 -- --  CL 106 102 106 108  CO2 32 29 31 31   GLUCOSE 166* 113* 117* 150*  BUN 7 10 11 11   CREATININE 0.97 1.05 0.99 0.85  CALCIUM 9.9 9.3 10.0 9.6  MG -- -- -- --  PHOS -- -- -- --    ASSESSMENT/PLAN:  Acute respiratory failure initially from Pneumococcal PNA/bacteremia, and E coli PNA.  Multiple rib fx after CPR.  Now with tracheomalacia limiting ability to proceed with extubation, thus requiring trach.  Has hx of asthma.    - On 4/21 - self decannulated trach. Maintaining respiratory status but paradoxical  - In 4/24 - resp status status quo. Mildly paradoxical when supiine. No distress. Ct chest 4/24 - RUL opactity improving  PLAN  - NAsal cannula oxygen  - remains at high risk for resp distress due to tracheomalacia; but given 4 day stability since decannulation: wil hold off on trach -  --prn bronchodilators   ID --Unexplained fever,Low grade fever 99- 100F persists. No results found for this basename: PROCALCITON:5 in the last 168 hours  Lab 02/12/12 2007 02/10/12 0400  WBC 9.4 13.0*   CXR  - RUL opaicty perssits but CT chest 4/24 shows improvement on day 35  days since admit MRI Brain shows Rt Mastoid effusion  PLAN - ENT consult Dr Vale Haven called - he wil plan a drain of the  effusion  Dysphagia -  Swallow eval done 4/19 and 4/22>>Dysphagia 1 puree with honey thick liquid PLAN  - npo for ent procedure  Agitated delirium/ anoxic encephalopathy - wonder if this is her new baseline. S/p haldol 4/22 (10mg ) with good response. Neuro consult 02/11/12 -  risperdal scheduled 1mg  daily +  add haldol scheduled IV 4mg  Q46h on 02/12/12. EEG with possible seizure focus on 4/24. MRI Brain ok PLAN  - reduce scheduled haldol dose  - appreciate neuro input - Rx fever - see ID section   HTN, tachycardia -continue catapres -metoprolol 75 bid, use IV prn  - dc tele   GLOBAL To SNF rehab after getting neuro workup complete and sorting fever out with possible ENT. Need to update daughter; left message to call unit RN back on both 4/23 and 4/24 but no success in reaching so far   Dr. Kalman Shan, M.D., [facility].C.P Pulmonary and Critical Care Medicine Staff Physician [facility] [place] [facility] Pager: [phone], If no answer or between  15:00h - 7:00h: call [phone]  02/13/2012 8:58 AM

## 2012-02-13 NOTE — Progress Notes (Signed)
Clinical Social Worker received notification from [facility] that pt's Medicaid has been approved.  CSW informed SNF.  CSW phoned pt's dtr and left message with updated information.  CSW to continue to follow and assist as needed.   Angelia Mould, MSW, [place] [phone]

## 2012-02-13 NOTE — Progress Notes (Signed)
Physical Therapy Treatment Patient Details Name: Emily Harrington MRN: [id number] DOB: 1951-09-26 Today's Date: 02/13/2012 Time: 8295-6213 PT Time Calculation (min): 36 min  PT Assessment / Plan / Recommendation Comments on Treatment Session  Patient admitted for PEA arrest with anoxic encephalopathy.  Multiple complications with VDRF.  Decannulated recently and on O2 now.  Progressing but limited today by BM while walking and confusion persistent.  PA came in and informed PT that patient was going to be started on a new med that may help her confusion and disorientation.      Follow Up Recommendations  Skilled nursing facility;Supervision/Assistance - 24 hour    Equipment Recommendations  Defer to next venue    Frequency Min 3X/week   Plan Discharge plan remains appropriate;Frequency remains appropriate    Precautions / Restrictions Precautions Precautions: Fall Restrictions Weight Bearing Restrictions: No   Pertinent Vitals/Pain VSS/ No pain    Mobility  Bed Mobility Bed Mobility: Rolling Right;Right Sidelying to Sit Rolling Right: 5: Supervision;With rail Right Sidelying to Sit: 5: Supervision;With rails;HOB flat Supine to Sit: 5: Supervision;With rails;HOB flat Sitting - Scoot to Edge of Bed: 5: Supervision;With rail Sit to Supine: Not Tested (comment) Transfers Transfers: Sit to Stand;Stand to Sit Sit to Stand: 1: +2 Total assist;From elevated surface;With upper extremity assist;From bed Sit to Stand: Patient Percentage: 70% Stand to Sit: 4: Min assist;With upper extremity assist;With armrests;To chair/3-in-1 Stand Pivot Transfers: Not tested (comment) Squat Pivot Transfers: Not tested (comment) Details for Transfer Assistance: Patient continues to need cues for hand placement as she tries to pull up on RW.  Needed incr physical assist due to decr processing.   Ambulation/Gait Ambulation/Gait Assistance: 1: +2 Total assist Ambulation/Gait: Patient Percentage:  70 Ambulation Distance (Feet): 50 Feet Assistive device: Rolling walker Ambulation/Gait Assistance Details: Once patient stood, noted some BM on pad.  Began to ambulate to the bathroom and patient began to "poop" as she was ambulating.  PT assisted her into the bathroom.  Patient needed max cues to get into the bathroom as she did not seem to realize that she had even gone to the bathroom and was not following commands to go to the toilet.  Took incr time to clean patient of BM.  Once, cleaned and changed gown, patient ambulated to chair and obtained chair alarm.   Gait Pattern: Step-to pattern;Trunk flexed;Shuffle Gait velocity: Somewhat faster gait pattern now, needs cues for safety.  Stairs: No Wheelchair Mobility Wheelchair Mobility: No         PT Goals Acute Rehab PT Goals Time For Goal Achievement: 02/20/12 Potential to Achieve Goals: Good PT Goal: Supine/Side to Sit - Progress: Progressing toward goal PT Goal: Sit at Edge Of Bed - Progress: Progressing toward goal PT Goal: Sit to Stand - Progress: Progressing toward goal PT Goal: Stand to Sit - Progress: Progressing toward goal PT Goal: Ambulate - Progress: Progressing toward goal  Visit Information  Last PT Received On: 02/13/12 Assistance Needed: +2    Subjective Data  Subjective: Patient continues with confusion.  "I am in a place where they take care of old people like me."   Cognition  Overall Cognitive Status: Impaired Area of Impairment: Attention;Following commands;Safety/judgement;Awareness of errors;Awareness of deficits Arousal/Alertness: Awake/alert Orientation Level: Person Behavior During Session: Anxious Current Attention Level: Focused Attention - Other Comments: up to 2 minutes Following Commands: Follows one step commands inconsistently Safety/Judgement: Decreased awareness of safety precautions;Decreased safety judgement for tasks assessed Safety/Judgement - Other Comments: Continues with poor safety  awareness Awareness of Errors: Assistance required to identify errors made;Assistance required to correct errors made    Balance  Static Sitting Balance Static Sitting - Balance Support: No upper extremity supported;Feet supported Static Sitting - Level of Assistance: 5: Stand by assistance Static Sitting - Comment/# of Minutes: PAtient sat EOB 3 minutes while adjusting gown.  Patient lied down suddenly one time needing cues to sit back up.    End of Session PT - End of Session Equipment Utilized During Treatment: Gait belt Activity Tolerance: Patient limited by fatigue Patient left: in chair;with call bell/phone within reach;with chair alarm set;with nursing in room;with restraints reapplied Nurse Communication: Mobility status    INGOLD,Wandalene Abrams 02/13/2012, 11:53 AM  [facility] [phone] (pager)

## 2012-02-14 ENCOUNTER — Encounter: Payer: Self-pay | Admitting: Neurology

## 2012-02-14 ENCOUNTER — Inpatient Hospital Stay (HOSPITAL_COMMUNITY): Payer: Medicaid Other

## 2012-02-14 DIAGNOSIS — I70269 Atherosclerosis of native arteries of extremities with gangrene, unspecified extremity: Secondary | ICD-10-CM

## 2012-02-14 DIAGNOSIS — I96 Gangrene, not elsewhere classified: Secondary | ICD-10-CM

## 2012-02-14 LAB — GLUCOSE, CAPILLARY

## 2012-02-14 LAB — VALPROIC ACID LEVEL: Valproic Acid Lvl: 44.2 ug/mL — ABNORMAL LOW (ref 50.0–100.0)

## 2012-02-14 MED ORDER — DIVALPROEX SODIUM 500 MG PO DR TAB: 500.0000 mg | DELAYED_RELEASE_TABLET | Freq: Two times a day (BID) | ORAL | Status: DC

## 2012-02-14 MED ORDER — STARCH (THICKENING) PO POWD: 1.0000 | ORAL | Status: DC | PRN

## 2012-02-14 MED ORDER — CLONIDINE HCL 0.1 MG/24HR TD PTWK: 0.1000 | MEDICATED_PATCH | TRANSDERMAL | Status: DC

## 2012-02-14 MED ORDER — DOXYCYCLINE HYCLATE 100 MG PO TABS: ORAL_TABLET | ORAL | Status: DC

## 2012-02-14 MED ORDER — HALOPERIDOL 10 MG PO TABS: 5.0000 mg | ORAL_TABLET | Freq: Four times a day (QID) | ORAL | Status: DC | PRN

## 2012-02-14 MED ORDER — METOPROLOL TARTRATE 50 MG PO TABS: 50.0000 mg | ORAL_TABLET | Freq: Two times a day (BID) | ORAL | Status: DC

## 2012-02-14 MED ORDER — RISPERIDONE 1 MG PO TABS: 1.0000 mg | ORAL_TABLET | Freq: Every day | ORAL | Status: DC

## 2012-02-14 MED ORDER — PANTOPRAZOLE SODIUM 40 MG PO PACK: 40.0000 mg | PACK | Freq: Every day | ORAL | Status: DC

## 2012-02-14 MED ORDER — PREDNISONE 10 MG PO TABS: ORAL_TABLET | ORAL | Status: DC

## 2012-02-14 NOTE — Progress Notes (Signed)
Clinical Social Worker received confirmation that [facility] can accept pt today.  CSW to continue to follow and assist as needed with dc planning.   Angelia Mould, MSW, [place] [phone]

## 2012-02-14 NOTE — Consult Note (Signed)
[facility] CONSULT NOTE 02/14/2012 DOB: [id number] MRN : [id number]  CC: gangrenous changes mult toes both feet with palpable distal pulses  History of Present Illness: Emily Harrington is a 61 y.o. female admitted on 01/08/2012 with Pneumococcal bacteremia, PEA arrest. Transitioned to SDU 3/23. Developed increasing dyspnea, and change in mental status and transfer back to ICU 3/30. Tracheostomy on 4/9 & off vent since 4/13. Pt noted to have some dry gangrenous changes on tips of right great toe and 1st-3rd toes left foot. Pt has palpable pulses bilaterally. Pt denies pain in either leg   Past Medical History  Diagnosis Date  . Asthma     Past Surgical History  Procedure Date  . Cholecystectomy   . Abdominal hysterectomy      ROS: [x]  Positive  [ ]  Denies    General: [ ]  Weight loss, [ ]  Fever, [ ]  chills Neurologic: [ ]  Dizziness, [ ]  Blackouts, [ ]  Seizure [ ]  Stroke, [ ]  "Mini stroke", [ ]  Slurred speech, [ ]  Temporary blindness; [ ]  weakness in arms or legs, [ ]  Hoarseness Cardiac: [ ]  Chest pain/pressure, [ ]  Shortness of breath at rest [ ]  Shortness of breath with exertion, [ ]  Atrial fibrillation or irregular heartbeat Vascular: [ ]  Pain in legs with walking, [ ]  Pain in legs at rest, [ ]  Pain in legs at night,  [ ]  Non-healing ulcer, [ ]  Blood clot in vein/DVT,   Pulmonary: [ ]  Home oxygen, [ ]  Productive cough, [ ]  Coughing up blood, [ ]  Asthma,  [ ]  Wheezing Musculoskeletal:  [ ]  Arthritis, [ ]  Low back pain, [x ] Joint pain Hematologic: [ ]  Easy Bruising, [ ]  Anemia; [ ]  Hepatitis Gastrointestinal: [ ]  Blood in stool, [ ]  Gastroesophageal Reflux/heartburn, [ ]  Trouble swallowing Urinary: [ ]  chronic Kidney disease, [ ]  on HD - [ ]  MWF or [ ]  TTHS, [ ]  Burning with urination, [ ]  Difficulty urinating Skin: [ ]  Rashes, [ ]  Wounds Psychological: [ ]  Anxiety, [ ]  Depression  Social History History  Substance Use Topics  . Smoking status:  Never Smoker   . Smokeless tobacco: Not on file  . Alcohol Use: No    Family History History reviewed. No pertinent family history.  Allergies  Allergen Reactions  . Nsaids     Current Facility-Administered Medications  Medication Dose Route Frequency Provider Last Rate Last Dose  . albuterol (PROVENTIL) (5 MG/ML) 0.5% nebulizer solution 2.5 mg  2.5 mg Nebulization Q2H PRN Coralyn Helling, MD   2.5 mg at 02/07/12 1921  . antiseptic oral rinse (BIOTENE) solution 15 mL  15 mL Mouth Rinse QID Alyson Reedy, MD   15 mL at 02/14/12 0404  . aspirin EC tablet 162 mg  162 mg Oral Daily Merwyn Katos, MD   162 mg at 02/13/12 1159  . chlorhexidine (PERIDEX) 0.12 % solution 15 mL  15 mL Mouth Rinse BID Alyson Reedy, MD   15 mL at 02/13/12 0800  . cloNIDine (CATAPRES - Dosed in mg/24 hr) patch 0.1 mg  0.1 mg Transdermal Weekly Zigmund Gottron, MD   0.1 mg at 02/07/12 2256  . divalproex (DEPAKOTE) DR tablet 500 mg  500 mg Oral Q12H Ulice Dash, PA   500 mg at 02/13/12 2143  . doxycycline (VIBRA-TABS) tablet 100 mg  100 mg Oral Q12H Melvenia Beam, MD   100 mg at 02/13/12 2143  . enoxaparin (LOVENOX) injection  40 mg  40 mg Subcutaneous Q24H [facility] [place], PHARMD   40 mg at 02/13/12 1300  . feeding supplement (ENSURE) pudding 1 Container  1 Container Oral TID WC Ailene Ards, RD   1 Container at 02/14/12 [phone]  . food thickener (THICK IT) powder   Oral PRN [name], MD      . Gerhardt's butt cream   Topical BID [name], MD      . haloperidol lactate (HALDOL) injection 2 mg  2 mg Intravenous Q6H Kalman Shan, MD   2 mg at 02/14/12 0402  . insulin aspart (novoLOG) injection 0-15 Units  0-15 Units Subcutaneous Q4H [name], MD   3 Units at 02/12/12 2046  . ipratropium (ATROVENT) nebulizer solution 0.5 mg  0.5 mg Nebulization Q2H PRN Coralyn Helling, MD   0.5 mg at 02/07/12 1921  . metoprolol (LOPRESSOR) injection 5 mg  5 mg Intravenous Q4H PRN [name], MD    5 mg at 02/03/12 0439  . metoprolol (LOPRESSOR) injection 5 mg  5 mg Intravenous Q6H Zigmund Gottron, MD   5 mg at 02/14/12 0604  . nystatin (NYSTOP) topical powder   Topical TID Alyson Reedy, MD      . pantoprazole sodium (PROTONIX) 40 mg/20 mL oral suspension 40 mg  40 mg Per Tube Q1200 [name], MD   40 mg at 02/13/12 1201  . predniSONE (DELTASONE) tablet 10 mg  10 mg Oral Q breakfast Melvenia Beam, MD      . predniSONE (DELTASONE) tablet 30 mg  30 mg Oral Q breakfast Melvenia Beam, MD   30 mg at 02/14/12 2952  . risperiDONE (RISPERDAL) tablet 1 mg  1 mg Oral Daily [name], MD   1 mg at 02/13/12 1200  . sodium chloride 0.9 % injection 10-40 mL  10-40 mL Intracatheter Q12H [name], MD   10 mL at 02/08/12 2235  . sodium chloride 0.9 % injection 10-40 mL  10-40 mL Intracatheter PRN [name], MD   20 mL at 02/14/12 0518  . valproate (DEPACON) 500 mg in dextrose 5 % 50 mL IVPB  500 mg Intravenous STAT Ulice Dash, PA   500 mg at 02/13/12 1202  . DISCONTD: acetaminophen (TYLENOL) solution 650 mg  650 mg Per Tube Q6H PRN Alyson Reedy, MD   650 mg at 01/31/12 2207  . DISCONTD: acetaminophen (TYLENOL) suppository 650 mg  650 mg Rectal Q6H PRN Zigmund Gottron, MD   650 mg at 02/12/12 0503  . DISCONTD: Gerhardt's butt cream   Topical BID [name], MD       Facility-Administered Medications Ordered in Other Encounters  Medication Dose Route Frequency Provider Last Rate Last Dose  . propofol (DIPRIVAN) 10 mg/mL bolus    PRN Deatra Robinson Mahony   60 mg at 01/18/12 1135  . succinylcholine (ANECTINE) injection    PRN Deatra Robinson Mahony   70 mg at 01/18/12 1135     Significant Diagnostic Studies: CBC Lab Results  Component Value Date   WBC 9.4 02/12/2012   HGB 9.6* 02/12/2012   HCT 31.5* 02/12/2012   MCV 94.6 02/12/2012   PLT 303 02/12/2012    BMET    Component Value Date/Time   NA 144 02/12/2012 2007   K 3.8 02/12/2012 2007   CL 106 02/12/2012 2007    CO2 32 02/12/2012 2007   GLUCOSE 166* 02/12/2012 2007   BUN  7 02/12/2012 2007   CREATININE 0.97 02/12/2012 2007   CALCIUM 9.9 02/12/2012 2007   GFRNONAA 61* 02/12/2012 2007   GFRAA 71* 02/12/2012 2007    COAG Lab Results  Component Value Date   INR 1.09 01/28/2012   INR 1.12 01/17/2012   INR 1.43 01/10/2012   No results found for this basename: PTT     Physical Examination  Patient Vitals for the past 24 hrs:  BP Temp Temp src Pulse Resp SpO2  02/14/12 0533 135/66 mmHg 98.6 F (37 C) Oral 122  20  99 %  02/13/12 2032 144/58 mmHg 99.1 F (37.3 C) Oral 123  20  99 %  02/13/12 1338 134/75 mmHg 99.2 F (37.3 C) Oral 115  18  92 %   Pulse Readings from Last 3 Encounters:  02/14/12 122  10/26/11 125    General:  WDWN in NAD; mildly agitated, able to follow some commands HENT: WNL Pulmonary: normal non-labored breathing , without Rales, rhonchi,  wheezing Cardiac: RRR ST, without  Murmurs, rubs or gallops; No carotid bruits Abdomen: soft, NT, no masses Skin: no rashes, ulcers noted Vascular Exam/Pulses: Femoral - bounding on right - pt moving left leg - diff to feel 2+ DP palpable bilaterally 2+ PT palpable on right; 1+ palp on left  Extremities without ischemic changes, right tip of great toe and tiops of 1-3 toes on left Gangrene , no cellulitis; no open wounds; no ulcers Musculoskeletal: no muscle wasting or atrophy  Neurologic: awake; fairly cooperative  SENSATION: normal; MOTOR FUNCTION:  moving all extremities equally.   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  PLAN: Do not feel vascular studies necessary at this time with palpable flow to both feet except obtaining baseline ABI's. will follow - if gangrene worsens or pt develops ulcers further W/U may be necessary.   Agree with above Patient with 3+ femoral popliteal 2+ dorsalis pedis and 2+ posterior tibial pulses palpable bilaterally. She was  treated with pressors when recovering from septic shock and now has developed over the past 30 days dry gangrenous changes on the tip of the right first toe and the left first and third toes. All of this is dry and uninfected. Patient is asymptomatic.  Recommendation would be to follow this to see if these areas will heal or slough or will require primary toe amputations and a later date

## 2012-02-14 NOTE — Procedures (Signed)
Objective Swallowing Evaluation: Modified Barium Swallowing Study  Patient Details  Name: Emily Harrington MRN: [id number] Date of Birth: [birth year]  Today's Date: 02/14/2012 Time:  -     Past Medical History:  Past Medical History  Diagnosis Date  . Asthma    Past Surgical History:  Past Surgical History  Procedure Date  . Cholecystectomy   . Abdominal hysterectomy    HPI:  61 year old female presented 3/20 with strep bacteremia ,PEA arrest requiring CPR was intubated enroute to the ED. Extubated 3/23 and moved to SDU, PCCM signed off 3/27. Cont to have difficulty with SOB and pain management and 3/30 developed worsening lethargy and resp distress and reintubated for hypercarbic resp failure ? Narcotic induced. Pt was with worsening right upper lobe pna vs. Mass s/p extubation 4/4. 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 A tracheostomy was performed 4/9. Patient initially placed on a dysphagia 2 diet with honey thick liquids following initial FEES however overall decline in function warranted return to NPO status. Initial MBS recommended Dys 1 diet and honey thick liquids. Pt has since shown some improvements in mentation and a repeat MBS was warranted to determine if pt may upgrade diet.      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 Pt with a delay in swallow initation to the pyriform sinuses with independent cup sips of thin liquids. Trace silent penetration occurs before the swallow which is removed with a cues throat clear. Large nectar thick bolus do not result in any penetration. Due to pts fluctuating mentation will recommend upgrade to nectar thick liquids and a dysphagia 2 (fine chopped) diet. The pt may be able to intiate thin liquids if she consistently demonstrates ability  at bedside to clear her throat after the swallow and sustain attention to POs without singificant restlesness.  Swallow Evaluation Recommendations Diet Recommendations: Dysphagia 2 (Fine chop);Nectar-thick liquid Liquid Administration via: Cup Medication Administration: Whole meds with puree Supervision: Patient able to self feed;Full supervision/cueing for compensatory strategies Compensations: Small sips/bites;Slow rate;Clear throat intermittently Postural Changes and/or Swallow Maneuvers: Seated upright 90 degrees;Upright 30-60 min after meal Oral Care Recommendations: Oral care BID Other Recommendations: Order thickener from pharmacy;Prohibited food (jello, ice cream, thin soups);Remove water pitcher Follow up Recommendations: Skilled Nursing facility Prognosis Prognosis for Safe Diet Advancement: Good Barriers to Reach Goals: Cognitive deficits    SLP Assessment/Plan Clinical impression: Pt continues to present with a 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 Pt with a delay in swallow initation to the pyriform sinuses with independent cup sips of thin liquids. Trace silent penetration occurs before the swallow which is removed with a cues throat clear. Large nectar thick bolus do not result in any penetration. Due to pts fluctuating mentation will recommend upgrade to nectar thick liquids and a dysphagia 2 (fine chopped) diet. The pt may be able to intiate thin liquids if she consistently demonstrates ability at bedside to clear her throat after the swallow and sustain attention to POs without singificant restlesness.   SLP Goals  SLP Swallowing Goals Patient will consume recommended diet without observed clinical signs of aspiration with: Minimal assistance Patient will utilize recommended strategies during swallow to increase swallowing safety with: Minimal assistance Goal #3: Pt will consume trials of thin lqiuids with a consistent throat clear every few swallows  with minimal verbal cues prior to upgrade at bedside.  General:  HPI: 61 year old female presented 3/20 with strep bacteremia ,PEA arrest requiring CPR was intubated enroute to the ED. Extubated 3/23 and moved to SDU, PCCM signed off 3/27. Cont to have difficulty with SOB and pain management and 3/30 developed worsening lethargy and resp distress and reintubated for hypercarbic resp failure ? Narcotic induced. Pt was with worsening right upper lobe pna vs. Mass s/p extubation 4/4. 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 A tracheostomy was performed 4/9. Patient initially placed on a dysphagia 2 diet with honey thick liquids following initial FEES however overall decline in function warranted return to NPO status. Initial MBS recommended Dys 1 diet and honey thick liquids. Pt has since shown some improvements in mentation and a repeat MBS was warranted to determine if pt may upgrade diet.  Type of Study: Modified Barium Swallowing Study Previous Swallow Assessment: Bedside swallow evaluation 4/4 recommended NPO. FEES complete 4/11 recommended dysphagia 1 with honey thick liquids. MBS Dysphagia 1 honey thick liquids Diet Prior to this Study: Dysphagia 1 (puree);Honey-thick liquids Temperature Spikes Noted: Yes Respiratory Status: Supplemental O2 delivered via (comment) History of Intubation: Yes Length of Intubations (days): 13 days Date extubated: 01/23/12 Behavior/Cognition: Alert;Cooperative;Confused Oral Cavity - Dentition: Dentures, top;Dentures, not available Oral Motor / Sensory Function: Within functional limits Vision: Functional for self-feeding Patient Positioning: Postural control interferes with function Baseline Vocal Quality: Clear Volitional Cough: Strong Volitional Swallow: Able to elicit Anatomy: Within functional limits Pharyngeal Secretions: Not observed secondary MBS   Oral  Phase Oral - Nectar Oral - Nectar Cup: Holding of bolus Oral - Thin Oral - Thin Cup: Holding of bolus Oral - Solids Oral - Puree: Within functional limits Oral - Mechanical Soft: Impaired mastication;Delayed oral transit Pharyngeal Phase  Pharyngeal Phase Pharyngeal Phase: Impaired Pharyngeal - Pudding Pharyngeal - Pudding Teaspoon: Not tested Pharyngeal - Pudding Cup: Not tested Pharyngeal - Honey Pharyngeal - Honey Teaspoon: Not tested Pharyngeal - Honey Cup: Not tested Pharyngeal - Honey Syringe: Not tested Pharyngeal - Nectar Pharyngeal - Nectar Teaspoon: Not tested Pharyngeal - Nectar Cup: Delayed swallow initiation;Premature spillage to pyriform;Penetration/Aspiration before swallow Penetration/Aspiration details (nectar cup): Material enters airway, remains ABOVE vocal cords then ejected out Pharyngeal - Nectar Straw: Not tested Pharyngeal - Nectar Syringe: Not tested Pharyngeal - Thin Pharyngeal - Thin Teaspoon: Not tested Pharyngeal - Thin Cup: Delayed swallow initiation;Premature spillage to pyriform;Penetration/Aspiration before swallow Penetration/Aspiration details (thin cup): Material enters airway, remains ABOVE vocal cords and not ejected out Pharyngeal - Thin Straw: Not tested Pharyngeal - Thin Syringe: Not tested Pharyngeal - Solids Pharyngeal - Puree: Delayed swallow initiation;Premature spillage to valleculae Pharyngeal - Mechanical Soft: Delayed swallow initiation;Premature spillage to valleculae Pharyngeal - Regular: Not tested Pharyngeal - Multi-consistency: Not tested Pharyngeal - Pill: Not tested Cervical Esophageal Phase  Cervical Esophageal Phase Cervical Esophageal Phase: [facility]  Harlon Ditty, MA CCC-SLP [phone] Kerby Hockley, Riley Nearing 02/14/2012, 3:12 PM

## 2012-02-14 NOTE — Progress Notes (Signed)
Emily Harrington is a 61 y.o. female admitted on 01/08/2012 with Pneumococcal bacteremia, PEA arrest.  Transitioned to SDU 3/23.  Developed increasing dyspnea, and change in mental status and transfer back to ICU 3/30. Tracheostomy on 4/9 & off vent since 4/13  Line/tube: (Truncated 4/8) ETT 3/30>>4/4, ETT 4/4>>4/9,Trach (DF) 4/9>> pulled out 02/09/12 am.  Lt IJ CVL 3/30>>4/8 Rt PICC 4/8>>   Cultures: (Truncated 4/8) Blood 3/20>>Pneumococcus Sputum 3/20>>E coli BAL 4/1>>Candida C diff 4/2>>negative Blood 4/14 >>ng resp 4/14 >>candida  No results found for this or any previous visit (from the past 240 hour(s)).   Antibiotics: (Truncated 4/8) Zosyn 3/30>>4/8 Vancomycin 3/30>>4/3 unasyn 4/14 (? Aspn, fever, wc) >>4/19 (planned) Flagyl 4/14 (c.diff ) > 4/21  Anti-infectives     Start     Dose/Rate Route Frequency Ordered Stop   02/13/12 2200   doxycycline (VIBRA-TABS) tablet 100 mg        100 mg Oral Every 12 hours 02/13/12 1650 02/23/12 2159   02/07/12 2300   metroNIDAZOLE (FLAGYL) IVPB 500 mg  Status:  Discontinued        500 mg 100 mL/hr over 60 Minutes Intravenous 3 times per day 02/07/12 2138 02/11/12 0851   02/02/12 2000   Ampicillin-Sulbactam (UNASYN) 3 g in sodium chloride 0.9 % 100 mL IVPB  Status:  Discontinued        3 g 100 mL/hr over 60 Minutes Intravenous Every 6 hours 02/02/12 1931 02/07/12 1149   02/02/12 1000   metroNIDAZOLE (FLAGYL) 50 mg/ml oral suspension 500 mg  Status:  Discontinued        500 mg Oral 3 times daily 02/02/12 0937 02/07/12 2138   01/22/12 1800   vancomycin (VANCOCIN) 750 mg in sodium chloride 0.9 % 150 mL IVPB  Status:  Discontinued        750 mg 150 mL/hr over 60 Minutes Intravenous Every 24 hours 01/22/12 1351 01/23/12 0909   01/18/12 1300   vancomycin (VANCOCIN) IVPB 1000 mg/200 mL premix  Status:  Discontinued        1,000 mg 200 mL/hr over 60 Minutes Intravenous Every 24 hours 01/18/12 1215 01/22/12 1350   01/18/12 1300    piperacillin-tazobactam (ZOSYN) IVPB 3.375 g  Status:  Discontinued        3.375 g 12.5 mL/hr over 240 Minutes Intravenous Every 8 hours 01/18/12 1215 01/27/12 1026   01/13/12 1000   levofloxacin (LEVAQUIN) tablet 250 mg  Status:  Discontinued        250 mg Oral Daily 01/12/12 1150 01/18/12 1145   01/12/12 1200   levofloxacin (LEVAQUIN) tablet 500 mg        500 mg Oral  Once 01/12/12 1135 01/12/12 1250   01/12/12 1200   levofloxacin (LEVAQUIN) tablet 250 mg  Status:  Discontinued        250 mg Oral Daily 01/12/12 1149 01/12/12 1150   01/09/12 1100   Levofloxacin (LEVAQUIN) IVPB 750 mg  Status:  Discontinued        750 mg 100 mL/hr over 90 Minutes Intravenous Every 48 hours 01/09/12 1028 01/12/12 1133   01/09/12 1000   vancomycin (VANCOCIN) IVPB 1000 mg/200 mL premix  Status:  Discontinued        1,000 mg 200 mL/hr over 60 Minutes Intravenous Every 24 hours 01/09/12 1403 01/11/12 0956   01/08/12 2300   piperacillin-tazobactam (ZOSYN) IVPB 3.375 g  Status:  Discontinued        3.375 g 12.5 mL/hr over 240 Minutes Intravenous  Every 8 hours 01/08/12 1659 01/11/12 0956   01/08/12 1800   vancomycin (VANCOCIN) 1,500 mg in sodium chloride 0.9 % 500 mL IVPB  Status:  Discontinued        1,500 mg 250 mL/hr over 120 Minutes Intravenous Every 12 hours 01/08/12 1659 01/09/12 1403   01/08/12 1700  piperacillin-tazobactam (ZOSYN) IVPB 3.375 g       3.375 g 100 mL/hr over 30 Minutes Intravenous  Once 01/08/12 1659 01/08/12 2043           Tests/events: 3/20 Echo>>EF 55 to 60%, mild LVH 3/20 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 3/20 CT abd/pelvis>>Large Rt renal pelvis stone, avascular necrosis Lt femoral head 3/22 EEG>>non-specific slow rhythm, no epileptiform activity 3/27 Renal u/s>>Large Rt renal pelvis stone w/o hydronephrosis 3/29 CT chest>>decrease RUL ASD, patchy LUL ASD/ATX, b/l effusions, multiple rib fx's and probable sternal fx 3/29 Doppler legs>>no DVT in either Rt or Lt legs 4/1  Bronchoscopy>>atypical cells in cytology from RUL 4/4 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 Seizure focus - neuro consult - Rx Depakote. Also t, MRI - right mastoid effusion and chronic sinusitis. ENT consult - doxycycline and pred taper and fu recommended.  CT chest improving RUL opacity.  4/26 - repeat speech eval. 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 Improved mentation  SUBJECTIVE:  4/24 EEG - ? Seizure focus - neuro consult - Rx Depakote. Also t, MRI - right mastoid effusion and chronic sinusitis. ENT consult - doxycycline and pred taper and fu recommended.  CT chest improving RUL opacity.  4/26 - repeat speech eval. 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 Improved mentation     OBJECTIVE:  Blood pressure 135/66, pulse 122, temperature 98.6 F (37 C), temperature source Oral, resp. rate 20, height 5\' 5"  (1.651 m), weight 80.7 kg (177 lb 14.6 oz), SpO2 99.00%. Wt Readings from Last 3 Encounters:  02/08/12 80.7 kg (177 lb 14.6 oz)   Body mass index is 29.61 kg/(m^2).  I/O last 3 completed shifts: In: 480 [P.O.:480] Out: 400 [Urine:400]     Physical Exam: General -  Chronically unwell looking HEENT - Trach site clean Cardiac - s1s2 irregular,tachy,  no murmur Chest - decreased depth inspiration, pops and squeaks in bases.  No true wheezing. Paradoxical Respiration almost resolved.   Ab - soft, nontender, + bowel sounds Ext - no edema . Toes on both feet - dry gangrene + Neuro - Alert but confused. RASS +1. CAM-ICU posiotive for delirium. Improving mentatin though  CBC    Component Value Date/Time   WBC 9.4 02/12/2012 2007   RBC 3.33* 02/12/2012 2007   HGB 9.6* 02/12/2012 2007   HCT 31.5* 02/12/2012 2007   PLT 303  02/12/2012 2007   MCV 94.6 02/12/2012 2007   MCH 28.8 02/12/2012 2007   MCHC 30.5 02/12/2012 2007   RDW 20.7* 02/12/2012 2007   LYMPHSABS 1.0 01/11/2012 0340   MONOABS 0.7 01/11/2012 0340   EOSABS 0.0 01/11/2012 0340   BASOSABS 0.0 01/11/2012 0340    BMET    Component Value Date/Time   NA 144 02/12/2012 2007   K 3.8 02/12/2012 2007   CL 106 02/12/2012 2007   CO2 32 02/12/2012 2007   GLUCOSE 166* 02/12/2012 2007   BUN 7  02/12/2012 2007   CREATININE 0.97 02/12/2012 2007   CALCIUM 9.9 02/12/2012 2007   GFRNONAA 61* 02/12/2012 2007   GFRAA 71* 02/12/2012 2007   Ct Chest Wo Contrast  02/12/2012  *RADIOLOGY REPORT*  Clinical Data: Right upper lobe consolidation versus empyema  CT CHEST WITHOUT CONTRAST  Technique:  Multidetector CT imaging of the chest was performed following the standard protocol without IV contrast.  Comparison: Chest radiograph dated 02/12/2012.  CT chest dated 01/17/2012 and 01/08/2012.  Findings: Wedge-shaped parenchymal opacity in the lateral right upper lobe (series 3/image 17).  This appearance is much improved from prior CTs.  Additional 4 mm nodule in the medial right upper lobe (series 3/image 17).  Mild patchy opacity in the posterior lower lobes (for example, series 3/image 33), possibly reflecting atelectasis or scarring.  No pleural effusion or pneumothorax.  The visualized thyroid is unremarkable.  The heart is normal in size.  No pericardial effusion.  Right arm PICC.  No suspicious mediastinal or axillary lymphadenopathy.  Small hiatal hernia.  Visualized upper abdomen is otherwise unremarkable.  Healing sternal fracture (sagittal image 57).  Multiple healing bilateral anterolateral rib fractures.  IMPRESSION: Wedge-shaped parenchymal opacity in the lateral right upper lobe, much improved from prior CTs.  Given lack of interval change on recent radiographs, this may reflect residual scarring rather than infection.  No pleural effusion or empyema.  Healing sternal and bilateral rib  fractures.  Original Report Authenticated By: Charline Bills, M.D.   Mr Brain Wo Contrast  02/12/2012  *RADIOLOGY REPORT*  Clinical Data: Confusion.  Rule out stroke.  MRI HEAD WITHOUT CONTRAST  Technique:  Multiplanar, multiecho pulse sequences of the brain and surrounding structures were obtained according to standard protocol without intravenous contrast.  Comparison: CT 01/08/2012  Findings: Image quality degraded by patient motion.  Negative for acute infarct.  Mild atrophy and mild chronic microvascular ischemic change in the white matter.  Mild chronic ischemia in the pons.  Negative for hemorrhage or mass.  Ventricle size is normal.  No midline shift.  Mastoid sinus effusion on the right.  Mild mucosal edema in the paranasal sinuses.  IMPRESSION: Atrophy and mild chronic microvascular ischemia.  No acute infarct.  Right mastoid sinus effusion and mild chronic paranasal sinusitis.  Original Report Authenticated By: Camelia Phenes, M.D.     Ct Chest Wo Contrast  02/12/2012  *RADIOLOGY REPORT*  Clinical Data: Right upper lobe consolidation versus empyema  CT CHEST WITHOUT CONTRAST  Technique:  Multidetector CT imaging of the chest was performed following the standard protocol without IV contrast.  Comparison: Chest radiograph dated 02/12/2012.  CT chest dated 01/17/2012 and 01/08/2012.  Findings: Wedge-shaped parenchymal opacity in the lateral right upper lobe (series 3/image 17).  This appearance is much improved from prior CTs.  Additional 4 mm nodule in the medial right upper lobe (series 3/image 17).  Mild patchy opacity in the posterior lower lobes (for example, series 3/image 33), possibly reflecting atelectasis or scarring.  No pleural effusion or pneumothorax.  The visualized thyroid is unremarkable.  The heart is normal in size.  No pericardial effusion.  Right arm PICC.  No suspicious mediastinal or axillary lymphadenopathy.  Small hiatal hernia.  Visualized upper abdomen is otherwise  unremarkable.  Healing sternal fracture (sagittal image 57).  Multiple healing bilateral anterolateral rib fractures.  IMPRESSION: Wedge-shaped parenchymal opacity in the lateral right upper lobe, much improved from prior CTs.  Given lack of interval change on recent radiographs, this may reflect residual scarring  rather than infection.  No pleural effusion or empyema.  Healing sternal and bilateral rib fractures.  Original Report Authenticated By: Charline Bills, M.D.   Mr Brain Wo Contrast  02/12/2012  *RADIOLOGY REPORT*  Clinical Data: Confusion.  Rule out stroke.  MRI HEAD WITHOUT CONTRAST  Technique:  Multiplanar, multiecho pulse sequences of the brain and surrounding structures were obtained according to standard protocol without intravenous contrast.  Comparison: CT 01/08/2012  Findings: Image quality degraded by patient motion.  Negative for acute infarct.  Mild atrophy and mild chronic microvascular ischemic change in the white matter.  Mild chronic ischemia in the pons.  Negative for hemorrhage or mass.  Ventricle size is normal.  No midline shift.  Mastoid sinus effusion on the right.  Mild mucosal edema in the paranasal sinuses.  IMPRESSION: Atrophy and mild chronic microvascular ischemia.  No acute infarct.  Right mastoid sinus effusion and mild chronic paranasal sinusitis.  Original Report Authenticated By: Camelia Phenes, M.D.     Lab 02/12/12 2007 02/10/12 0400 02/08/12 0515  NA 144 139 143  K 3.8 3.6 --  CL 106 102 106  CO2 32 29 31  GLUCOSE 166* 113* 117*  BUN 7 10 11   CREATININE 0.97 1.05 0.99  CALCIUM 9.9 9.3 10.0  MG -- -- --  PHOS -- -- --    ASSESSMENT/PLAN:  Acute respiratory failure initially from Pneumococcal PNA/bacteremia, and E coli PNA.  Multiple rib fx after CPR.  Now with tracheomalacia limiting ability to proceed with extubation, thus requiring trach.  Has hx of asthma.    - On 4/21 - self decannulated trach. Maintaining respiratory status but paradoxical  -  In 4/24 - resp status status quo. Mildly paradoxical when supiine. No distress. Ct chest 4/24 - RUL opactity improving  - On 4/26 - hardly any paradoxical respiration  PLAN  - NAsal cannula oxygen for pulse ox > 88%  - remains at risk for resp distress due to tracheomalacia; but given 5 day stability since decannulation: wil hold off on trach - and clinically monitor --prn bronchodilators  - OPD fu with CXR with Dr Marchelle Gearing in 2-3 weeks (15 min slot)   ID --Unexplained fever,Low grade fever. Afebrile 4/26 after starting doxy 4/25 for rt matsoid infection s. No results found for this basename: PROCALCITON:5 in the last 168 hours  Lab 02/12/12 2007 02/10/12 0400  WBC 9.4 13.0*   CXR  - RUL opaicty perssits but CT chest 4/24 shows improvement on day 35  days since admit MRI Brain shows Rt Mastoid effusion. ENT consult recommends prednisone and doxy  PLAN  - will do 12 day pred taper starting 02/13/12 - at Please take Take prednisone 40mg  once daily x 3 days, then 30mg  once daily x 3 days, then 20mg  once daily x 3 days, then prednisone 10mg  once daily  x 3 days and stop - will do 12 day doxycycline 100mg  po bid starting 02/13/12 - OPD ful  ENT Dr Vale Haven  Dysphagia -Swallow eval done 4/19 and 4/22>>Dysphagia 1 puree with honey thick liquid  - On 02/14/12 - speech feels she is improving and planning repeat swallow  PLAN  - D1 diet and to advance in SNF  Agitated delirium/ anoxic encephalopathy. S/p haldol 4/22 (10mg ) with good response. Neuro consult 02/11/12 with MRI  4/24 normall but EEG 4/24   showing possible seizure. S/p depakote since 4/25  - On 4/26 - still confused but lot more alert and calmer. Now without sitter  x 24h.    PLAN  -  Do risperdal scheduled  - do haldol prn  - continue depakote  - opd fu with Dr Morton Amy neurology  HTN, tachycardia. 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 Good pulses. [facility] consult by Dr Hart [place] Plan  - they recommend opd fu with them   GLOBAL To SNF rehab today. Dr Marchelle Gearing updated dtr over phone who is very anxious about discharge to sNF rehab. Explained that this is natural transition in care and that medical problems should be addressed at sNF rehab and opd fu for all of above needed and given to ensure optimal care. However, due to critical illness and prolonged recovery she is at risk for another decmpensation which is inherent risk and unrelated to transition.    Dr. Kalman Shan, M.D., [facility].C.P Pulmonary and Critical Care Medicine Staff Physician [facility] Pager: [phone], If no answer or between  15:00h - 7:00h: call [phone]  02/14/2012 11:19 AM

## 2012-02-14 NOTE — Progress Notes (Signed)
Speech Language Pathology Dysphagia Treatment  Patient Details Name: Emily Harrington MRN: [id number] DOB: 1951/03/22 Today's Date: 02/14/2012  SLP Assessment/Plan/Recommendation Assessment / Recommendations / Plan Clinical Impression Statement: Pt more confused today than when last seen by this SLP. Required min verbal cues to attend to meal. SLP minimized visual distractions with improved result. Pt with no overt s/s of aspiration with trials of thin liqiuids though a delayed swallow response with decreased hyolaryngeal excursion observed. Pt is ready for repeat MBS to determine if diet upgrade if possible.  Continue with Current Diet: Dysphagia 1 (puree);Honey-thick liquid Liquids provided via: Cup Medication Administration: Crushed with puree Supervision: Patient able to self feed;Full supervision/cueing for compensatory strategies Compensations: Slow rate;Small sips/bites Postural Changes and/or Swallow Maneuvers: Seated upright 90 degrees;Upright 30-60 min after meal Oral Care Recommendations: Oral care BID Plan: MBS Swallowing Goals  SLP Swallowing Goals Patient will consume recommended diet without observed clinical signs of aspiration with: Moderate assistance Swallow Study Goal #1 - Progress: Progressing toward goal Patient will utilize recommended strategies during swallow to increase swallowing safety with: Moderate assistance Swallow Study Goal #2 - Progress: Progressing toward goal  General Temperature Spikes Noted: Yes Respiratory Status: Supplemental O2 delivered via (comment) Behavior/Cognition: Alert;Cooperative;Confused Oral Cavity - Dentition: Dentures, top;Dentures, not available Patient Positioning: Upright in bed  Oral Cavity - Oral Hygiene Patient is AT RISK - Oral Care Protocol followed (see row info): Yes   Dysphagia Treatment Treatment focused on: Skilled observation of diet tolerance;Utilization of compensatory strategies Treatment Methods/Modalities:  Skilled observation Patient observed directly with PO's: Yes Type of PO's observed: Honey-thick liquids;Thin liquids Feeding: Needs set up;Needs assist Liquids provided via: Cup Oral Phase Signs & Symptoms: Prolonged oral phase Pharyngeal Phase Signs & Symptoms: Suspected delayed swallow initiation Type of cueing: Verbal Amount of cueing: Minimal   Harlon Ditty, MA CCC-SLP [phone]  Claudine Mouton 02/14/2012, 10:12 AM

## 2012-02-14 NOTE — Progress Notes (Signed)
   CARE MANAGEMENT NOTE 02/14/2012  Patient:  Emily Harrington, Emily Harrington   Account Number:  [id number]  Date Initiated:  01/20/2012  Documentation initiated by:  [facility]  Subjective/Objective Assessment:   01-08-12 admitted with PEA arrest - extubated and resp failure again on 01-19-12 requiring intubation.  Lives alone has family.     Action/Plan:   Anticipated DC Date:  02/14/2012   Anticipated DC Plan:  SKILLED NURSING FACILITY  In-house referral  Clinical Social Worker      DC Planning Services  CM consult      Choice offered to / List presented to:             Status of service:  Completed, signed off Medicare Important Message given?   (If response is "NO", the following Medicare IM given date fields will be blank) Date Medicare IM given:   Date Additional Medicare IM given:    Discharge Disposition:  SKILLED NURSING FACILITY  Per UR Regulation:  Reviewed for med. necessity/level of care/duration of stay  If discussed at Long Length of Stay Meetings, dates discussed:   01/22/2012  01/29/2012  02/05/2012    Comments:  02/14/12 Ewing Fandino,RN,BSN 1400 PT FOR DISCHARGE TODAY TO [facility], PER CSW ARRANGEMENTS. Phone #[phone]   02/10/12 Jochebed Bills,RN,BSN 1100 PER CSW, PT HAS ONE BED OFFER FOR SNF.  PT HAS PULLED OUT TRACH; MD STATES WILL NEED TO DECIDE WHETHER OR NOT TO RE-TRACH PT.  SPEECH THERAPY TO REEVALULATE PT NOW THAT SHE IS DECANNULATED.  WILL FOLLOW UP.  CSW UPDATED.

## 2012-02-14 NOTE — Progress Notes (Signed)
[facility] PROGRESS NOTE    SUBJECTIVE   More alert today and able to tell me where she is.  No problems over night on Depakote.    OBJECTIVE   Vital signs in last 24 hours: Temp:  [98.6 F (37 C)-99.2 F (37.3 C)] 98.6 F (37 C) (04/26 0533) Pulse Rate:  [115-123] 122  (04/26 0533) Resp:  [18-20] 20  (04/26 0533) BP: (134-144)/(58-75) 135/66 mmHg (04/26 0533) SpO2:  [92 %-99 %] 99 % (04/26 0533)  Intake/Output from previous day: 04/25 0701 - 04/26 0700 In: 480 [P.O.:480] Out: 400 [Urine:400] Intake/Output this shift:   Nutritional status: Dysphagia  Past Medical History  Diagnosis Date  . Asthma     Neurologic Exam:  Mental Status: She is more alert today, able to tell me the year, month, place, and city.  She also shows increased attention span.   Speech fluent without evidence of aphasia. Able to follow simple commands without difficulty but not complex commands.  Cranial Nerves:  II-Visual fields grossly intact.  III/IV/VI-Extraocular movements intact. Pupils reactive bilaterally.  V/VII-Smile symmetric  VIII-grossly intact  IX/X-normal gag  XI-bilateral shoulder shrug  XII-midline tongue extension  Motor: 5/5 bilaterally with normal tone and bulk  Sensory: Pinprick and light touch intact throughout, bilaterally  Deep Tendon Reflexes: 1+ and symmetric throughout  Plantars: Downgoing bilaterally  Cerebellar: Normal finger-to-nose, normal heel-to-shin test.    Lab Results: Results for orders placed during the hospital encounter of 01/08/12 (from the past 24 hour(s))  GLUCOSE, CAPILLARY     Status: Abnormal   Collection Time   02/13/12 11:45 AM      Component Value Range   Glucose-Capillary 124 (*) 70 - 99 (mg/dL)   Comment 1 Notify RN    VALPROIC ACID LEVEL     Status: Abnormal   Collection Time   02/13/12  1:16 PM      Component Value Range   Valproic Acid Lvl 36.0 (*) 50.0 - 100.0 (ug/mL)  GLUCOSE,  CAPILLARY     Status: Normal   Collection Time   02/13/12  4:10 PM      Component Value Range   Glucose-Capillary 88  70 - 99 (mg/dL)   Comment 1 Documented in Chart     Comment 2 Notify RN    GLUCOSE, CAPILLARY     Status: Abnormal   Collection Time   02/13/12  8:21 PM      Component Value Range   Glucose-Capillary 111 (*) 70 - 99 (mg/dL)   Comment 1 Documented in Chart     Comment 2 Notify RN    GLUCOSE, CAPILLARY     Status: Abnormal   Collection Time   02/13/12 11:52 PM      Component Value Range   Glucose-Capillary 110 (*) 70 - 99 (mg/dL)   Comment 1 Notify RN    GLUCOSE, CAPILLARY     Status: Abnormal   Collection Time   02/14/12  3:53 AM      Component Value Range   Glucose-Capillary 112 (*) 70 - 99 (mg/dL)   Comment 1 STAT Lab     Comment 2 Notify RN    VALPROIC ACID LEVEL     Status: Abnormal   Collection Time   02/14/12  5:20 AM  Component Value Range   Valproic Acid Lvl 44.2 (*) 50.0 - 100.0 (ug/mL)  GLUCOSE, CAPILLARY     Status: Abnormal   Collection Time   02/14/12  8:08 AM      Component Value Range   Glucose-Capillary 112 (*) 70 - 99 (mg/dL)   Comment 1 Notify RN     Lipid Panel No results found for this basename: CHOL,TRIG,HDL,CHOLHDL,VLDL,LDLCALC in the last 72 hours  Studies/Results: Ct Chest Wo Contrast  02/12/2012  *RADIOLOGY REPORT*  Clinical Data: Right upper lobe consolidation versus empyema  CT CHEST WITHOUT CONTRAST  Technique:  Multidetector CT imaging of the chest was performed following the standard protocol without IV contrast.  Comparison: Chest radiograph dated 02/12/2012.  CT chest dated 01/17/2012 and 01/08/2012.  Findings: Wedge-shaped parenchymal opacity in the lateral right upper lobe (series 3/image 17).  This appearance is much improved from prior CTs.  Additional 4 mm nodule in the medial right upper lobe (series 3/image 17).  Mild patchy opacity in the posterior lower lobes (for example, series 3/image 33), possibly reflecting  atelectasis or scarring.  No pleural effusion or pneumothorax.  The visualized thyroid is unremarkable.  The heart is normal in size.  No pericardial effusion.  Right arm PICC.  No suspicious mediastinal or axillary lymphadenopathy.  Small hiatal hernia.  Visualized upper abdomen is otherwise unremarkable.  Healing sternal fracture (sagittal image 57).  Multiple healing bilateral anterolateral rib fractures.  IMPRESSION: Wedge-shaped parenchymal opacity in the lateral right upper lobe, much improved from prior CTs.  Given lack of interval change on recent radiographs, this may reflect residual scarring rather than infection.  No pleural effusion or empyema.  Healing sternal and bilateral rib fractures.  Original Report Authenticated By: Charline Bills, M.D.   Mr Brain Wo Contrast  02/12/2012  *RADIOLOGY REPORT*  Clinical Data: Confusion.  Rule out stroke.  MRI HEAD WITHOUT CONTRAST  Technique:  Multiplanar, multiecho pulse sequences of the brain and surrounding structures were obtained according to standard protocol without intravenous contrast.  Comparison: CT 01/08/2012  Findings: Image quality degraded by patient motion.  Negative for acute infarct.  Mild atrophy and mild chronic microvascular ischemic change in the white matter.  Mild chronic ischemia in the pons.  Negative for hemorrhage or mass.  Ventricle size is normal.  No midline shift.  Mastoid sinus effusion on the right.  Mild mucosal edema in the paranasal sinuses.  IMPRESSION: Atrophy and mild chronic microvascular ischemia.  No acute infarct.  Right mastoid sinus effusion and mild chronic paranasal sinusitis.  Original Report Authenticated By: Camelia Phenes, M.D.    Medications:     Scheduled:   . antiseptic oral rinse  15 mL Mouth Rinse QID  . aspirin EC  162 mg Oral Daily  . chlorhexidine  15 mL Mouth Rinse BID  . cloNIDine  0.1 mg Transdermal Weekly  . divalproex  500 mg Oral Q12H  . doxycycline  100 mg Oral Q12H  . enoxaparin  (LOVENOX) injection  40 mg Subcutaneous Q24H  . feeding supplement  1 Container Oral TID WC  . Gerhardt's butt cream   Topical BID  . haloperidol lactate  2 mg Intravenous Q6H  . insulin aspart  0-15 Units Subcutaneous Q4H  . metoprolol  5 mg Intravenous Q6H  . nystatin   Topical TID  . pantoprazole sodium  40 mg Per Tube Q1200  . predniSONE  10 mg Oral Q breakfast  . predniSONE  30 mg Oral Q breakfast  .  risperiDONE  1 mg Oral Daily  . sodium chloride  10-40 mL Intracatheter Q12H  . valproate sodium  500 mg Intravenous STAT  . DISCONTD: Gerhardt's butt cream   Topical BID    Assessment/Plan:   61 YO female who was brought to ED after PEA arrest and found to have E. Coli and Pneumococcal PNA S/P arrest. Patient continues to show agitaion and cnofusion. Can not rule out the possibility of cerebral hypoperfusion injury. 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  Doing well and attention improved.    Recommend:  1) Continue Depakote 500 mg BID  2) Will need to Follow up out patient with neurologist 2-3 weeks to evaluate confusion and Depakote.   Neurology Sign off  I have spoken with PCCMD  I have discussed patient with Dr. Lyman Speller and agrees with the above mentioned.     Felicie Morn PA-C [facility] [phone]  02/14/2012, 9:24 AM

## 2012-02-14 NOTE — Progress Notes (Signed)
Clinical Social Worker received confirmation from SNF and RN that pt ready for dc.  CSW went to speak with pt, pt currently asleep and did not respond to name being called.  CSW phoned transport. CSW to sign off at this time, please re consult if needed.   Angelia Mould, MSW, [place] [phone]

## 2012-02-14 NOTE — Progress Notes (Signed)
Clinical Social Worker spoke with dtr, RN, MD, and RNCM, to confirm dc today to [facility].  Dtr expressed concern that pt was "being pushed out".  CSW validated feelings and reviewed that dtr has spoken with MD and RN to have questions addressed.  Dtr finalized conversation with, "Well, it (SNF) will only be for a couple of weeks so I don't think it (SNF) will be that bad".   CSW submitted appropriate paperwork; CSW to arrange transport once confirmed with RN.    Angelia Mould, MSW, [place] [phone]

## 2012-02-14 NOTE — Discharge Summary (Signed)
Physician Discharge Summary  Patient ID: Emily Harrington MRN: [id number] DOB/AGE: [birth year] 61 y.o.  Admit date: 01/08/2012 Discharge date: 02/14/2012  Problem List Principal Problem:  *Acute-on-chronic respiratory failure Active Problems:  Septic shock  Cardiac arrest  Right upper lobe pneumonia  Acute renal failure with tubular necrosis  Asthma exacerbation  Pleuritic chest pain  E. coli pneumonia  Bacteremia due to Streptococcus pneumoniae  HTN (hypertension)  Avascular necrosis of femur head, left  Multiple rib fractures after CPR  Sternal fracture after CPR  Tracheomalacia  Encephalopathy acute  Tracheostomy status  Gangrene  HPI: 61 year old female with unknown PMH who crawled from her room and lost consciousness. EMS was called and patient was brought to the ED. Enroute the patient decompensated and became combative and unable to protect airway. Patient also dropped her pressure and was intubated enroute to the ED.  Hospital Course: Line/tube: (Truncated 4/8)  ETT 3/30>>4/4, ETT 4/4>>4/9,Trach (DF) 4/9>> pulled out 02/09/12 am.  Lt IJ CVL 3/30>>4/8  Rt PICC 4/8>>  dc'ed 02/14/2012  Cultures: (Truncated 4/8)  Blood 3/20>>Pneumococcus  Sputum 3/20>>E coli  BAL 4/1>>Candida  C diff 4/2>>negative  Blood 4/14 >>ng  resp 4/14 >>candida  No results found for this or any previous visit (from the past 240 hour(s)).   Antibiotics: (Truncated 4/8)  Zosyn 3/30>>4/8  Vancomycin 3/30>>4/3  unasyn 4/14 (? Aspn, fever, wc) >>4/19 (planned)  Flagyl 4/14 (c.diff ) > 4/21  Anti-infectives     Start    Dose/Rate  Route  Frequency  Ordered  Stop     02/13/12 2200    doxycycline (VIBRA-TABS) tablet 100 mg  100 mg  Oral  Every 12 hours  02/13/12 1650  02/23/12 2159                 02/07/12 2300    metroNIDAZOLE (FLAGYL) IVPB 500 mg Status: Discontinued   500 mg  100 mL/hr over 60 Minutes  Intravenous  3 times per day  02/07/12 2138  02/11/12 0851                  02/02/12  2000    Ampicillin-Sulbactam (UNASYN) 3 g in sodium chloride 0.9 % 100 mL IVPB Status: Discontinued   3 g  100 mL/hr over 60 Minutes  Intravenous  Every 6 hours  02/02/12 1931  02/07/12 1149                  02/02/12 1000    metroNIDAZOLE (FLAGYL) 50 mg/ml oral suspension 500 mg Status: Discontinued   500 mg  Oral  3 times daily  02/02/12 0937  02/07/12 2138                  01/22/12 1800    vancomycin (VANCOCIN) 750 mg in sodium chloride 0.9 % 150 mL IVPB Status: Discontinued   750 mg  150 mL/hr over 60 Minutes  Intravenous  Every 24 hours  01/22/12 1351  01/23/12 0909                  01/18/12 1300    vancomycin (VANCOCIN) IVPB 1000 mg/200 mL premix Status: Discontinued   1,000 mg  200 mL/hr over 60 Minutes  Intravenous  Every 24 hours  01/18/12 1215  01/22/12 1350                  01/18/12 1300    piperacillin-tazobactam (ZOSYN) IVPB 3.375 g Status: Discontinued   3.375 g  12.5 mL/hr  over 240 Minutes  Intravenous  Every 8 hours  01/18/12 1215  01/27/12 1026                  01/13/12 1000    levofloxacin (LEVAQUIN) tablet 250 mg Status: Discontinued   250 mg  Oral  Daily  01/12/12 1150  01/18/12 1145                  01/12/12 1200    levofloxacin (LEVAQUIN) tablet 500 mg  500 mg  Oral  Once  01/12/12 1135  01/12/12 1250                 01/12/12 1200    levofloxacin (LEVAQUIN) tablet 250 mg Status: Discontinued   250 mg  Oral  Daily  01/12/12 1149  01/12/12 1150                  01/09/12 1100    Levofloxacin (LEVAQUIN) IVPB 750 mg Status: Discontinued   750 mg  100 mL/hr over 90 Minutes  Intravenous  Every 48 hours  01/09/12 1028  01/12/12 1133                  01/09/12 1000    vancomycin (VANCOCIN) IVPB 1000 mg/200 mL premix Status: Discontinued   1,000 mg  200 mL/hr over 60 Minutes  Intravenous  Every 24 hours  01/09/12 1403  01/11/12 0956                  01/08/12 2300    piperacillin-tazobactam (ZOSYN) IVPB 3.375 g Status: Discontinued   3.375 g  12.5 mL/hr over 240 Minutes   Intravenous  Every 8 hours  01/08/12 1659  01/11/12 0956                  01/08/12 1800    vancomycin (VANCOCIN) 1,500 mg in sodium chloride 0.9 % 500 mL IVPB Status: Discontinued   1,500 mg  250 mL/hr over 120 Minutes  Intravenous  Every 12 hours  01/08/12 1659  01/09/12 1403                  01/08/12 1700   piperacillin-tazobactam (ZOSYN) IVPB 3.375 g   3.375 g  100 mL/hr over 30 Minutes  Intravenous  Once  01/08/12 1659  01/08/12 2043                     Tests/events:  3/20 Echo>>EF 55 to 60%, mild LVH  3/20 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  3/20 CT abd/pelvis>>Large Rt renal pelvis stone, avascular necrosis Lt femoral head  3/22 EEG>>non-specific slow rhythm, no epileptiform activity  3/27 Renal u/s>>Large Rt renal pelvis stone w/o hydronephrosis  3/29 CT chest>>decrease RUL ASD, patchy LUL ASD/ATX, b/l effusions, multiple rib fx's and probable sternal fx  3/29 Doppler legs>>no DVT in either Rt or Lt legs  4/1 Bronchoscopy>>atypical cells in cytology from RUL  4/4 Bronchoscopy>>severe tracheomalacia  4/9 Bronchoscopy>>BAL cytology RUL negative  4/13 RVR  4/18 downsize trach to cuffless 4  4/21 - accidental trach decannulation\  4/22 - d1 diet okayed  423 - neuro consult, Added IV haldol to po risperdal for delirum  4/24 EEG - ? Seizure focus - neuro consult - Rx Depakote. Also t, MRI - right mastoid effusion and chronic sinusitis. ENT consult - doxycycline and pred taper and fu recommended. CT chest improving RUL opacity.  4/26 - repeat speech eval. Dry gangrene noted on both toes - [facility] recommends  opd fu. Improved mentation  SUBJECTIVE:  4/24 EEG - ? Seizure focus - neuro consult - Rx Depakote. Also t, MRI - right mastoid effusion and chronic sinusitis. ENT consult - doxycycline and pred taper and fu recommended. CT chest improving RUL opacity.  4/26 - repeat speech eval. 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 Improved mentation  OBJECTIVE:  Blood pressure 135/66, pulse 122,  temperature 98.6 F (37 C), temperature source Oral, resp. rate 20, height 5\' 5"  (1.651 m), weight 80.7 kg (177 lb 14.6 oz), SpO2 99.00%.  Wt Readings from Last 3 Encounters:   02/08/12  80.7 kg (177 lb 14.6 oz)    Body mass index is 29.61 kg/(m^2).  I/O last 3 completed shifts:  In: 480 [P.O.:480]  Out: 400 [Urine:400]   Physical Exam:  General - Chronically unwell looking  HEENT - Trach site clean  Cardiac - s1s2 irregular,tachy, no murmur  Chest - decreased depth inspiration, pops and squeaks in bases. No true wheezing. Paradoxical Respiration almost resolved.  Ab - soft, nontender, + bowel sounds  Ext - no edema . Toes on both feet - dry gangrene +  Neuro - Alert but confused. RASS +1. CAM-ICU posiotive for delirium. Improving mentatin though  CBC    Component  Value  Date/Time    WBC  9.4  02/12/2012 2007    RBC  3.33*  02/12/2012 2007    HGB  9.6*  02/12/2012 2007    HCT  31.5*  02/12/2012 2007    PLT  303  02/12/2012 2007    MCV  94.6  02/12/2012 2007    MCH  28.8  02/12/2012 2007    MCHC  30.5  02/12/2012 2007    RDW  20.7*  02/12/2012 2007    LYMPHSABS  1.0  01/11/2012 0340    MONOABS  0.7  01/11/2012 0340    EOSABS  0.0  01/11/2012 0340    BASOSABS  0.0  01/11/2012 0340    BMET    Component  Value  Date/Time    NA  144  02/12/2012 2007    K  3.8  02/12/2012 2007    CL  106  02/12/2012 2007    CO2  32  02/12/2012 2007    GLUCOSE  166*  02/12/2012 2007    BUN  7  02/12/2012 2007    CREATININE  0.97  02/12/2012 2007    CALCIUM  9.9  02/12/2012 2007    GFRNONAA  61*  02/12/2012 2007    GFRAA  71*  02/12/2012 2007    Ct Chest Wo Contrast  02/12/2012 *RADIOLOGY REPORT* Clinical Data: Right upper lobe consolidation versus empyema CT CHEST WITHOUT CONTRAST Technique: Multidetector CT imaging of the chest was performed following the standard protocol without IV contrast. Comparison: Chest radiograph dated 02/12/2012. CT chest dated 01/17/2012 and 01/08/2012. Findings: Wedge-shaped  parenchymal opacity in the lateral right upper lobe (series 3/image 17). This appearance is much improved from prior CTs. Additional 4 mm nodule in the medial right upper lobe (series 3/image 17). Mild patchy opacity in the posterior lower lobes (for example, series 3/image 33), possibly reflecting atelectasis or scarring. No pleural effusion or pneumothorax. The visualized thyroid is unremarkable. The heart is normal in size. No pericardial effusion. Right arm PICC. No suspicious mediastinal or axillary lymphadenopathy. Small hiatal hernia. Visualized upper abdomen is otherwise unremarkable. Healing sternal fracture (sagittal image 57). Multiple healing bilateral anterolateral rib fractures. IMPRESSION: Wedge-shaped parenchymal opacity  in the lateral right upper lobe, much improved from prior CTs. Given lack of interval change on recent radiographs, this may reflect residual scarring rather than infection. No pleural effusion or empyema. Healing sternal and bilateral rib fractures. Original Report Authenticated By: Charline Bills, M.D.  Mr Brain Wo Contrast  02/12/2012 *RADIOLOGY REPORT* Clinical Data: Confusion. Rule out stroke. MRI HEAD WITHOUT CONTRAST Technique: Multiplanar, multiecho pulse sequences of the brain and surrounding structures were obtained according to standard protocol without intravenous contrast. Comparison: CT 01/08/2012 Findings: Image quality degraded by patient motion. Negative for acute infarct. Mild atrophy and mild chronic microvascular ischemic change in the white matter. Mild chronic ischemia in the pons. Negative for hemorrhage or mass. Ventricle size is normal. No midline shift. Mastoid sinus effusion on the right. Mild mucosal edema in the paranasal sinuses. IMPRESSION: Atrophy and mild chronic microvascular ischemia. No acute infarct. Right mastoid sinus effusion and mild chronic paranasal sinusitis. Original Report Authenticated By: Camelia Phenes, M.D.   Ct Chest Wo Contrast   02/12/2012 *RADIOLOGY REPORT* Clinical Data: Right upper lobe consolidation versus empyema CT CHEST WITHOUT CONTRAST Technique: Multidetector CT imaging of the chest was performed following the standard protocol without IV contrast. Comparison: Chest radiograph dated 02/12/2012. CT chest dated 01/17/2012 and 01/08/2012. Findings: Wedge-shaped parenchymal opacity in the lateral right upper lobe (series 3/image 17). This appearance is much improved from prior CTs. Additional 4 mm nodule in the medial right upper lobe (series 3/image 17). Mild patchy opacity in the posterior lower lobes (for example, series 3/image 33), possibly reflecting atelectasis or scarring. No pleural effusion or pneumothorax. The visualized thyroid is unremarkable. The heart is normal in size. No pericardial effusion. Right arm PICC. No suspicious mediastinal or axillary lymphadenopathy. Small hiatal hernia. Visualized upper abdomen is otherwise unremarkable. Healing sternal fracture (sagittal image 57). Multiple healing bilateral anterolateral rib fractures. IMPRESSION: Wedge-shaped parenchymal opacity in the lateral right upper lobe, much improved from prior CTs. Given lack of interval change on recent radiographs, this may reflect residual scarring rather than infection. No pleural effusion or empyema. Healing sternal and bilateral rib fractures. Original Report Authenticated By: Charline Bills, M.D.  Mr Brain Wo Contrast  02/12/2012 *RADIOLOGY REPORT* Clinical Data: Confusion. Rule out stroke. MRI HEAD WITHOUT CONTRAST Technique: Multiplanar, multiecho pulse sequences of the brain and surrounding structures were obtained according to standard protocol without intravenous contrast. Comparison: CT 01/08/2012 Findings: Image quality degraded by patient motion. Negative for acute infarct. Mild atrophy and mild chronic microvascular ischemic change in the white matter. Mild chronic ischemia in the pons. Negative for hemorrhage or mass.  Ventricle size is normal. No midline shift. Mastoid sinus effusion on the right. Mild mucosal edema in the paranasal sinuses. IMPRESSION: Atrophy and mild chronic microvascular ischemia. No acute infarct. Right mastoid sinus effusion and mild chronic paranasal sinusitis. Original Report Authenticated By: Camelia Phenes, M.D.    Lab  02/12/12 2007  02/10/12 0400  02/08/12 0515   NA  144  139  143   K  3.8  3.6  --   CL  106  102  106   CO2  32  29  31   GLUCOSE  166*  113*  117*   BUN  7  10  11    CREATININE  0.97  1.05  0.99   CALCIUM  9.9  9.3  10.0   MG  --  --  --   PHOS  --  --  --    ASSESSMENT/PLAN:  Acute respiratory failure initially from Pneumococcal PNA/bacteremia, and E coli PNA. Multiple rib fx after CPR. Now with tracheomalacia limiting ability to proceed with extubation, thus requiring trach. Has hx of asthma.  - On 4/21 - self decannulated trach. Maintaining respiratory status but paradoxical  - In 4/24 - resp status status quo. Mildly paradoxical when supiine. No distress. Ct chest 4/24 - RUL opactity improving  - On 4/26 - hardly any paradoxical respiration  PLAN  - NAsal cannula oxygen for pulse ox > 88%  - remains at risk for resp distress due to tracheomalacia; but given 5 day stability since decannulation: wil hold off on trach - and clinically monitor  --prn bronchodilators  - OPD fu with CXR with Dr Marchelle Gearing in 2-3 weeks (15 min slot)  ID --Unexplained fever,Low grade fever. Afebrile 4/26 after starting doxy 4/25 for rt matsoid infection s. No results found for this basename: PROCALCITON:5 in the last 168 hours   Lab  02/12/12 2007  02/10/12 0400   WBC  9.4  13.0*    CXR - RUL opaicty perssits but CT chest 4/24 shows improvement on day 35 days since admit  MRI Brain shows Rt Mastoid effusion. ENT consult recommends prednisone and doxy  PLAN  - will do 12 day pred taper starting 02/13/12 - at Please take Take prednisone 40mg  once daily x 3 days, then 30mg  once  daily x 3 days, then 20mg  once daily x 3 days, then prednisone 10mg  once daily x 3 days and stop  - will do 12 day doxycycline 100mg  po bid starting 02/13/12  - OPD ful ENT Dr Vale Haven  Dysphagia  -Swallow eval done 4/19 and 4/22>>Dysphagia 1 puree with honey thick liquid  - On 02/14/12 - speech feels she is improving and planning repeat swallow  PLAN  - D1 diet and to advance in SNF  Agitated delirium/ anoxic encephalopathy. S/p haldol 4/22 (10mg ) with good response. Neuro consult 02/11/12 with MRI 4/24 normall but EEG 4/24  showing possible seizure. S/p depakote since 4/25  - On 4/26 - still confused but lot more alert and calmer. Now without sitter x 24h.  PLAN  - Do risperdal scheduled  - do haldol prn  - continue depakote  - opd fu with Dr Morton Amy neurology  HTN, tachycardia. 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 Good pulses. [facility] consult by Dr Hart [place]  Plan  - they recommend opd fu with them  GLOBAL  To SNF rehab 4/26. Dr Marchelle Gearing updated dtr over phone who is very anxious about discharge to sNF rehab. Explained that this is natural transition in care and that medical problems should be addressed at sNF rehab and opd fu for all of above needed and given to ensure optimal care. However, due to critical illness and prolonged recovery she is at risk for another decmpensation which is inherent risk and unrelated to transition.        Labs at discharge Lab Results  Component Value Date   CREATININE 0.97 02/12/2012   BUN 7 02/12/2012   NA 144 02/12/2012   K 3.8 02/12/2012   CL 106 02/12/2012   CO2 32 02/12/2012   Lab Results  Component Value Date   WBC 9.4 02/12/2012   HGB 9.6* 02/12/2012   HCT 31.5* 02/12/2012   MCV 94.6 02/12/2012   PLT 303 02/12/2012   Lab  Results  Component Value Date   ALT 14 02/12/2012   AST 21 02/12/2012   ALKPHOS 173* 02/12/2012   BILITOT 0.2*  02/12/2012   Lab Results  Component Value Date   INR 1.09 01/28/2012   INR 1.12 01/17/2012   INR 1.43 01/10/2012    Current radiology studies Ct Chest Wo Contrast  02/12/2012  *RADIOLOGY REPORT*  Clinical Data: Right upper lobe consolidation versus empyema  CT CHEST WITHOUT CONTRAST  Technique:  Multidetector CT imaging of the chest was performed following the standard protocol without IV contrast.  Comparison: Chest radiograph dated 02/12/2012.  CT chest dated 01/17/2012 and 01/08/2012.  Findings: Wedge-shaped parenchymal opacity in the lateral right upper lobe (series 3/image 17).  This appearance is much improved from prior CTs.  Additional 4 mm nodule in the medial right upper lobe (series 3/image 17).  Mild patchy opacity in the posterior lower lobes (for example, series 3/image 33), possibly reflecting atelectasis or scarring.  No pleural effusion or pneumothorax.  The visualized thyroid is unremarkable.  The heart is normal in size.  No pericardial effusion.  Right arm PICC.  No suspicious mediastinal or axillary lymphadenopathy.  Small hiatal hernia.  Visualized upper abdomen is otherwise unremarkable.  Healing sternal fracture (sagittal image 57).  Multiple healing bilateral anterolateral rib fractures.  IMPRESSION: Wedge-shaped parenchymal opacity in the lateral right upper lobe, much improved from prior CTs.  Given lack of interval change on recent radiographs, this may reflect residual scarring rather than infection.  No pleural effusion or empyema.  Healing sternal and bilateral rib fractures.  Original Report Authenticated By: Charline Bills, M.D.   Mr Brain Wo Contrast  02/12/2012  *RADIOLOGY REPORT*  Clinical Data: Confusion.  Rule out stroke.  MRI HEAD WITHOUT CONTRAST  Technique:  Multiplanar, multiecho pulse sequences of the brain and surrounding structures were obtained according to standard protocol without intravenous contrast.  Comparison: CT 01/08/2012  Findings: Image quality  degraded by patient motion.  Negative for acute infarct.  Mild atrophy and mild chronic microvascular ischemic change in the white matter.  Mild chronic ischemia in the pons.  Negative for hemorrhage or mass.  Ventricle size is normal.  No midline shift.  Mastoid sinus effusion on the right.  Mild mucosal edema in the paranasal sinuses.  IMPRESSION: Atrophy and mild chronic microvascular ischemia.  No acute infarct.  Right mastoid sinus effusion and mild chronic paranasal sinusitis.  Original Report Authenticated By: Camelia Phenes, M.D.    Disposition:  01-Home or Self Care  Discharge Orders    Future Appointments: Provider: Department: Dept Phone: Center:   03/06/2012 4:15 PM Kalman Shan, MD [facility] [phone] None   03/10/2012 10:45 AM Pryor Ochoa, MD [facility] [phone] [facility]   04/17/2012 3:30 PM Milas Gain, MD [facility] [phone] None     Future Orders Please Complete By Expires   Discharge to SNF when bed available        Medication List  As of 02/14/2012 12:29 PM   TAKE these medications         albuterol 108 (90 BASE) MCG/ACT inhaler   Commonly known as: PROVENTIL HFA;VENTOLIN HFA   Inhale 2 puffs into the lungs every 4 (four) hours as needed. For wheezing or shortness of breath.      aspirin EC 81 MG tablet   Take 162 mg by mouth daily.      cloNIDine 0.1 mg/24hr patch   Commonly known as: CATAPRES - Dosed in mg/24 hr  Place 1 patch (0.1 mg total) onto the skin once a week.      divalproex 500 MG DR tablet   Commonly known as: DEPAKOTE   Take 1 tablet (500 mg total) by mouth every 12 (twelve) hours.      doxycycline 100 MG tablet   Commonly known as: VIBRA-TABS   Start date 4/25 give x 12 days ttoal.      food thickener Powd   Commonly known as: THICK IT   Take 1 Container by mouth as needed.      haloperidol 10 MG tablet   Commonly known as: HALDOL   Take 0.5 tablets (5 mg total) by mouth 4 (four) times daily as needed.       metoprolol 50 MG tablet   Commonly known as: LOPRESSOR   Take 1 tablet (50 mg total) by mouth 2 (two) times daily.      multivitamin capsule   Take 1 capsule by mouth daily.      pantoprazole sodium 40 mg/20 mL Pack   Commonly known as: PROTONIX   Place 20 mLs (40 mg total) into feeding tube daily at 12 noon.      predniSONE 10 MG tablet   Commonly known as: DELTASONE   3 tabs x 3 days, then  2 tabs x 3 days, then  1 tabs x 3 days, then stop      risperiDONE 1 MG tablet   Commonly known as: RISPERDAL   Take 1 tablet (1 mg total) by mouth daily.           Follow-up Information    Follow up with [facility], MD on 03/06/2012. (4:15 pm)    Contact information:   [address] [place] [address] [phone]       Follow up with Melvenia Beam, MD on 02/25/2012. (12:45 pm)    Contact information:   [facility] [address] [place] [address] [phone]       Follow up with Josephina Gip, MD on 03/10/2012. (10:45 am)    Contact information:   [address] [place] [address] [phone]       Follow up with Denton Meek, MD on 04/17/2012. (3:30 pm)    Contact information:   [address] [facility] [place] [address] [phone]          Discharged Condition: fair Vital signs at Discharge. Temp:  [98.6 F (37 C)-99.2 F (37.3 C)] 98.6 F (37 C) (04/26 0533) Pulse Rate:  [115-123] 122  (04/26 0533) Resp:  [18-20] 20  (04/26 0533) BP: (134-144)/(58-75) 135/66 mmHg (04/26 0533) SpO2:  [92 %-99 %] 99 % (04/26 0533) Office follow up Special Information or instructions. DC to [facility] 4/26. See follow up appointments Changed to po lopressor 4/26 , may need titration in future. Complete total 12 days of Doxycycline.. SignedBrett Canales Minor ACNP [facility] PCC[facility] Pager [phone] till 3 pm If no answer page [phone] 02/14/2012, 12:29 PM

## 2012-02-19 LAB — FUNGUS CULTURE W SMEAR

## 2012-02-26 ENCOUNTER — Ambulatory Visit: Payer: Self-pay | Admitting: Geriatric Medicine

## 2012-02-28 ENCOUNTER — Telehealth: Payer: Self-pay | Admitting: Vascular Surgery

## 2012-02-28 NOTE — Telephone Encounter (Addendum)
Message copied by Shari Prows on Fri Feb 28, 2012 10:19 AM ------      Message from: Phillips Odor      Created: Fri Feb 28, 2012  9:12 AM      Regarding: change appt to 5/14 for JDL       [facility] called / Dr. Larena Sox wants pt. To be seen sooner than 5/21 for gangrene on both feet.  I gave them an appt. Time for 10:45 AM on 5/14.  If this doesn't work into his schedule, can you call "Harriett Sine" at [facility] @ [phone] i spoke w/ Krystal Eaton at [facility] to confirm pt's appt on 03/03/12 at 10:45am. Jacklyn Shell

## 2012-03-02 ENCOUNTER — Telehealth: Payer: Self-pay | Admitting: Internal Medicine

## 2012-03-02 ENCOUNTER — Encounter: Payer: Self-pay | Admitting: Vascular Surgery

## 2012-03-02 NOTE — Telephone Encounter (Signed)
I [id number] to see if pt would be willing to move appt from 03-06-12 to 03-05-12 in AM. Carron Curie, CMA

## 2012-03-03 ENCOUNTER — Encounter: Payer: Self-pay | Admitting: Vascular Surgery

## 2012-03-03 ENCOUNTER — Ambulatory Visit (INDEPENDENT_AMBULATORY_CARE_PROVIDER_SITE_OTHER): Payer: Medicaid Other | Admitting: Vascular Surgery

## 2012-03-03 VITALS — BP 104/62 | HR 88 | Temp 98.4°F | Ht 65.0 in | Wt 164.0 lb

## 2012-03-03 DIAGNOSIS — I70269 Atherosclerosis of native arteries of extremities with gangrene, unspecified extremity: Secondary | ICD-10-CM

## 2012-03-03 LAB — AFB CULTURE WITH SMEAR (NOT AT ARMC): Acid Fast Smear: NONE SEEN

## 2012-03-03 NOTE — Progress Notes (Signed)
Subjective:     Patient ID: Emily Harrington, female   DOB: 25-Jul-1951, 61 y.o.   MRN: [id number]  HPI this 61 year old female was seen by me while in the hospital on 02/14/2012. She developed dry gangrene at the tip of her right first and left worse second and third toes do 2 vasopressors because of septic shock due to to pneumococcal pneumonia. When seen by me in the hospital she had excellent peripheral pulses-3+ dorsalis pedis bilaterally. A recommendation was to observe this in the office over several weeks-months to determine if toe amputations were indicated versus the tip of these toes sloughing with no formal surgery needed. Patient returns today for followup.  Past Medical History  Diagnosis Date  . Asthma     History  Substance Use Topics  . Smoking status: Never Smoker   . Smokeless tobacco: Not on file  . Alcohol Use: No    History reviewed. No pertinent family history.  Allergies  Allergen Reactions  . Nsaids     Current outpatient prescriptions:albuterol (PROVENTIL HFA;VENTOLIN HFA) 108 (90 BASE) MCG/ACT inhaler, Inhale 2 puffs into the lungs every 4 (four) hours as needed. For wheezing or shortness of breath., Disp: , Rfl: ;  aspirin EC 81 MG tablet, Take 162 mg by mouth daily., Disp: , Rfl: ;  cloNIDine (CATAPRES - DOSED IN MG/24 HR) 0.1 mg/24hr patch, Place 1 patch (0.1 mg total) onto the skin once a week., Disp: 4 patch, Rfl:  divalproex (DEPAKOTE) 500 MG DR tablet, Take 1 tablet (500 mg total) by mouth every 12 (twelve) hours., Disp: , Rfl: ;  doxycycline (VIBRA-TABS) 100 MG tablet, Start date 4/25 give x 12 days ttoal., Disp: , Rfl: ;  Flora-Q (FLORA-Q) CAPS, Take 1 capsule by mouth 2 (two) times daily., Disp: , Rfl: ;  haloperidol (HALDOL) 10 MG tablet, Take 0.5 tablets (5 mg total) by mouth 4 (four) times daily as needed., Disp: , Rfl:  metoprolol (LOPRESSOR) 50 MG tablet, Take 1 tablet (50 mg total) by mouth 2 (two) times daily., Disp: , Rfl: ;  Multiple Vitamin  (MULTIVITAMIN) capsule, Take 1 capsule by mouth daily. , Disp: , Rfl: ;  pantoprazole sodium (PROTONIX) 40 mg/20 mL PACK, Place 40 mg into feeding tube daily at 12 noon., Disp: , Rfl: ;  predniSONE (DELTASONE) 10 MG tablet, 3 tabs x 3 days, then 2 tabs x 3 days, then 1 tabs x 3 days, then stop, Disp: , Rfl:  risperiDONE (RISPERDAL) 1 MG tablet, Take 1 mg by mouth daily., Disp: , Rfl: ;  DISCONTD: pantoprazole sodium (PROTONIX) 40 mg/20 mL PACK, Place 20 mLs (40 mg total) into feeding tube daily at 12 noon., Disp: 30 each, Rfl: ;  DISCONTD: risperiDONE (RISPERDAL) 1 MG tablet, Take 1 tablet (1 mg total) by mouth daily., Disp: , Rfl: ;  food thickener (THICK IT) POWD, Take 1 Container by mouth as needed., Disp: , Rfl:  No current facility-administered medications for this visit. Facility-Administered Medications Ordered in Other Visits: propofol (DIPRIVAN) 10 mg/mL bolus, , , PRN, Deatra Robinson Mahony, 60 mg at 01/18/12 1135;  succinylcholine (ANECTINE) injection, , , PRN, Deatra Robinson Mahony, 70 mg at 01/18/12 1135  BP 104/62  Pulse 88  Temp(Src) 98.4 F (36.9 C) (Oral)  Ht 5\' 5"  (1.651 m)  Wt 164 lb (74.39 kg)  BMI 27.29 kg/m2  Body mass index is 27.29 kg/(m^2).          Review of Systems     Objective:  Physical Exam blood pressure 104/62 heart rate 88 respirations 16 Gen. chronically ill-appearing female in no apparent distress alert and oriented x3 Right foot with 3+ dorsalis pedis pulse palpable. There is a small eschar on the tip of the right first toe 1 cm in diameter which is uninfected Left foot with 3+ dorsalis pedis pulse palpable. There is dry gangrene at the tip of the first second and third toes. Eschar on the tip of the first toe was sharply debrided today. There is no infection in the tissues. Eschar is adherent on the tip of the second and third toes and not ready for debridement. Toenails are very long bilaterally and need to be trimmed by a podiatrist       Assessment:     Guy gangrene tip right first and left first second third toes-debrided in office-will not need formal surgical amputation should heal with time    Plan:     #1 moist gauze over the left first toe and keep gauze between toes #2 return to see me in 5 weeks for further followup #3 have podiatrist trim patient's nails

## 2012-03-04 NOTE — Telephone Encounter (Signed)
Schedule is full. Carron Curie, CMA

## 2012-03-06 ENCOUNTER — Encounter: Payer: Self-pay | Admitting: Internal Medicine

## 2012-03-06 ENCOUNTER — Ambulatory Visit (INDEPENDENT_AMBULATORY_CARE_PROVIDER_SITE_OTHER): Payer: Medicaid Other | Admitting: Internal Medicine

## 2012-03-06 ENCOUNTER — Ambulatory Visit (INDEPENDENT_AMBULATORY_CARE_PROVIDER_SITE_OTHER)
Admission: RE | Admit: 2012-03-06 | Discharge: 2012-03-06 | Disposition: A | Discharge status: Home / Self Care | Payer: Medicaid Other | Source: Ambulatory Visit | Attending: Internal Medicine | Admitting: Internal Medicine

## 2012-03-06 VITALS — BP 98/76 | HR 96 | Temp 97.7°F | Ht 65.0 in | Wt 164.4 lb

## 2012-03-06 DIAGNOSIS — J9620 Acute and chronic respiratory failure, unspecified whether with hypoxia or hypercapnia: Secondary | ICD-10-CM

## 2012-03-06 DIAGNOSIS — G9340 Encephalopathy, unspecified: Secondary | ICD-10-CM

## 2012-03-06 DIAGNOSIS — J9690 Respiratory failure, unspecified, unspecified whether with hypoxia or hypercapnia: Secondary | ICD-10-CM

## 2012-03-06 DIAGNOSIS — J189 Pneumonia, unspecified organism: Secondary | ICD-10-CM

## 2012-03-06 NOTE — Patient Instructions (Signed)
Glad you are doing better Continue your medications as listed Keep up followup appointments Recommend [facility] test your oxygen levels with you off oxygen both at rest and exertion.  Have chest xray today (ordered) Have spirometry at time of followup in 2 months with Jerolyn Shin our respiratory therapist Return in 2 months

## 2012-03-06 NOTE — Progress Notes (Signed)
  Subjective:    Patient ID: Emily Harrington, female    DOB: 1950-11-20, 61 y.o.   MRN: [id number]  HPI  OV .03/06/12  61 year old female. Body mass index is 27.36 kg/(m^2).  reports that she has never smoked. She does not have any smokeless tobacco history on file.    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 Right side (seen by Dr Vale Haven)   - dry gangrene of toes (seen by Dr Hart Rochester)     Presents 03/06/12 for followup of above. Now at rehab. Per aide  no more confusion. Still o2 using. Ability to do ADLs +. Still deconditoned but improving fast. Denies cough, fever, chills, sputum, edema, hemoptysis, dyspnea. Fatigue still + but improving. Eating by self Denies smoking. CXR today (personally reviewed) shows improving RUL peripheral opacity.   PAST HX: per HPI Review of Systems ROS - per HPI; otherwise negative    Objective:   Physical Exam Gen: Looks better but still deconditoned. Sitting in wheel chair Neuro: Alert and Oriented x 3. Best ever that I have known her HEENT: No trach scar. PERRL +. No neck nodes RESP CTA Bilaterally CVS: S1S2 +. No murmurs. No elevated jvp PA: Soft. No mass. Normal bowel sounds EXT: No cyanosis. No clubbing. No edema MSK: Deconditioned SKIN: intact         Assessment & Plan:

## 2012-03-07 NOTE — Assessment & Plan Note (Addendum)
Improved. Now on few liters oxygen only. RUL densitiy is remarkably better. I will see her again in few months and get cxr and spirometry at that time. I am pleased with her course so far. Have instructed her aide that o2 on RA at rest and exertion be tracked at nursing home rehab

## 2012-03-07 NOTE — Assessment & Plan Note (Signed)
Appears to have resolved. Wil follow with Dr Modesto Charon about continuation or not of her anti epileptic RX

## 2012-03-08 ENCOUNTER — Other Ambulatory Visit: Payer: Self-pay | Admitting: Geriatric Medicine

## 2012-03-08 LAB — URINALYSIS, COMPLETE
Bilirubin,UR: NEGATIVE
Glucose,UR: NEGATIVE mg/dL (ref 0–75)
Ketone: NEGATIVE
Ph: 8 (ref 4.5–8.0)
Protein: NEGATIVE
RBC,UR: 5 /HPF (ref 0–5)
Specific Gravity: 1.006 (ref 1.003–1.030)
WBC UR: 56 /HPF (ref 0–5)

## 2012-03-10 ENCOUNTER — Ambulatory Visit: Payer: 59 | Admitting: Vascular Surgery

## 2012-03-12 LAB — URINE CULTURE

## 2012-04-06 ENCOUNTER — Encounter: Payer: Self-pay | Admitting: Vascular Surgery

## 2012-04-07 ENCOUNTER — Ambulatory Visit (INDEPENDENT_AMBULATORY_CARE_PROVIDER_SITE_OTHER): Payer: Medicaid Other | Admitting: Vascular Surgery

## 2012-04-07 ENCOUNTER — Encounter: Payer: Self-pay | Admitting: Vascular Surgery

## 2012-04-07 VITALS — BP 70/40 | HR 56 | Resp 18 | Ht 65.0 in | Wt 162.0 lb

## 2012-04-07 DIAGNOSIS — I7025 Atherosclerosis of native arteries of other extremities with ulceration: Secondary | ICD-10-CM | POA: Insufficient documentation

## 2012-04-07 DIAGNOSIS — L98499 Non-pressure chronic ulcer of skin of other sites with unspecified severity: Secondary | ICD-10-CM

## 2012-04-07 DIAGNOSIS — I739 Peripheral vascular disease, unspecified: Secondary | ICD-10-CM

## 2012-04-07 NOTE — Progress Notes (Signed)
Subjective:     Patient ID: Emily Harrington, female   DOB: 25-Dec-1950, 61 y.o.   MRN: [id number]  HPI this 61 year old female returns for continued followup regarding her toes of both lower extremities. She had some ischemic areas on the tip of her right first toe and left first second and third toes secondary to pressor therapy when she was gravely ill. She has been getting local wound care. She is currently in an residing in a nursing facility. She did see a podiatrist to trim her toenails.  Past Medical History  Diagnosis Date  . Asthma     History  Substance Use Topics  . Smoking status: Never Smoker   . Smokeless tobacco: Not on file  . Alcohol Use: No    History reviewed. No pertinent family history.  Allergies  Allergen Reactions  . Nsaids     Current outpatient prescriptions:albuterol (PROVENTIL HFA;VENTOLIN HFA) 108 (90 BASE) MCG/ACT inhaler, Inhale 2 puffs into the lungs every 4 (four) hours as needed. For wheezing or shortness of breath., Disp: , Rfl: ;  aspirin EC 81 MG tablet, Take 162 mg by mouth daily., Disp: , Rfl: ;  divalproex (DEPAKOTE) 500 MG DR tablet, Take 1 tablet (500 mg total) by mouth every 12 (twelve) hours., Disp: , Rfl:  doxycycline (VIBRA-TABS) 100 MG tablet, Start date 4/25 give x 12 days ttoal., Disp: , Rfl: ;  Flora-Q (FLORA-Q) CAPS, Take 1 capsule by mouth 2 (two) times daily., Disp: , Rfl: ;  haloperidol (HALDOL) 10 MG tablet, Take 0.5 tablets (5 mg total) by mouth 4 (four) times daily as needed., Disp: , Rfl: ;  Lactobacillus (ACIDOPHILUS PO), Take by mouth 2 (two) times daily., Disp: , Rfl:  metoprolol (LOPRESSOR) 50 MG tablet, Take 1 tablet (50 mg total) by mouth 2 (two) times daily., Disp: , Rfl: ;  metoprolol succinate (TOPROL-XL) 50 MG 24 hr tablet, Take 50 mg by mouth daily. Take with or immediately following a meal., Disp: , Rfl: ;  Multiple Vitamin (MULTIVITAMIN) capsule, Take 1 capsule by mouth daily. , Disp: , Rfl: ;  pantoprazole sodium  (PROTONIX) 40 mg/20 mL PACK, Place 40 mg into feeding tube daily at 12 noon., Disp: , Rfl:  predniSONE (DELTASONE) 10 MG tablet, 3 tabs x 3 days, then 2 tabs x 3 days, then 1 tabs x 3 days, then stop, Disp: , Rfl:  No current facility-administered medications for this visit. Facility-Administered Medications Ordered in Other Visits: propofol (DIPRIVAN) 10 mg/mL bolus, , , PRN, Deatra Robinson Mahony, 60 mg at 01/18/12 1135;  succinylcholine (ANECTINE) injection, , , PRN, Deatra Robinson Mahony, 70 mg at 01/18/12 1135  BP 70/40  Pulse 56  Resp 18  Ht 5\' 5"  (1.651 m)  Wt 162 lb (73.483 kg)  BMI 26.96 kg/m2  Body mass index is 26.96 kg/(m^2).          Review of Systems on nasal oxygen with expiratory wheezing, ambulates very short distances, denies chest pain or neurologic symptoms    Objective:   Physical Exam blood pressure 70/40 heart rate 86 respirations 18 Gen. alert and oriented x3 in no apparent distress Lungs bilateral expiratory wheezing Cardiovascular regular rhythm no murmurs Lower extremities right leg with 4 mm eschar on tip of first toe Left leg with similar eschars on tip of first second and third toes all healing with no infection 3+ dorsalis pedis pulses palpable bilaterally    Assessment:     Healing ischemic areas on the tips of  4 digits with excellent underlying circulation Should heal within next 4-6 weeks    Plan:     Return to see me on when necessary basis Continue local wound care

## 2012-04-12 ENCOUNTER — Other Ambulatory Visit: Payer: Self-pay

## 2012-04-12 LAB — URINALYSIS, COMPLETE
Ketone: NEGATIVE
Nitrite: NEGATIVE
Protein: 30
Squamous Epithelial: 2
WBC UR: 546 /HPF (ref 0–5)

## 2012-04-14 LAB — URINE CULTURE

## 2012-04-17 ENCOUNTER — Encounter: Payer: Self-pay | Admitting: Neurology

## 2012-04-17 ENCOUNTER — Ambulatory Visit (INDEPENDENT_AMBULATORY_CARE_PROVIDER_SITE_OTHER): Payer: Medicaid Other | Admitting: Neurology

## 2012-04-17 VITALS — BP 100/60 | HR 92 | Ht 65.0 in | Wt 154.0 lb

## 2012-04-17 DIAGNOSIS — R569 Unspecified convulsions: Secondary | ICD-10-CM

## 2012-04-17 NOTE — Progress Notes (Signed)
Dear Dr. Marchelle Gearing  Thank you for having me see Emily Harrington in consultation today at [facility] for her problem with possible seizures and altered mental status.  As you may recall, she is a 61 y.o. year old female with a history of a prolonged hospitalization after having a PEA code in the setting of PNA and sepsis with resultant prolonged AMS.  As I understand it from review of the records she did not have actual frank seizure activity but rather was placed on Depakote 500 bid after having an abnormal EEG which showed possible sharp waves in the left temporal lobe.  The EEG was ordered for AMS.  Notably an EEG done about 1 month earlier did not show any sharp waves. An MRI brain done just before discharge did not show any obvious ischemic lesions or signs of anoxic injury.  Her AMS status has cleared.  She has not had any spells that suggest seizures.     Past Medical History  Diagnosis Date  . Asthma   . Hypertension   - prolonged hospitalization as above for PNA/sepsis/PEA code with prolonged AMS. - no history of seizures or stroke. Past Surgical History  Procedure Date  . Cholecystectomy   . Abdominal hysterectomy     History   Social History  . Marital Status: Married    Spouse Name: N/A    Number of Children: N/A  . Years of Education: N/A   Social History Main Topics  . Smoking status: Never Smoker   . Smokeless tobacco: None  . Alcohol Use: No  . Drug Use: No  . Sexually Active: None   Other Topics Concern  . None   Social History Narrative  . None    No family history of seizures.   Current Outpatient Prescriptions on File Prior to Visit  Medication Sig Dispense Refill  . albuterol (PROVENTIL HFA;VENTOLIN HFA) 108 (90 BASE) MCG/ACT inhaler Inhale 2 puffs into the lungs every 4 (four) hours as needed. For wheezing or shortness of breath.      Marland Kitchen aspirin EC 81 MG tablet Take 162 mg by mouth daily.      . divalproex (DEPAKOTE) 500 MG DR tablet Take  1 tablet (500 mg total) by mouth every 12 (twelve) hours.      . Lactobacillus (ACIDOPHILUS PO) Take by mouth 2 (two) times daily.      . metoprolol succinate (TOPROL-XL) 50 MG 24 hr tablet Take 50 mg by mouth daily. Take with or immediately following a meal.      . Multiple Vitamin (MULTIVITAMIN) capsule Take 1 capsule by mouth daily.       . pantoprazole sodium (PROTONIX) 40 mg/20 mL PACK Place 40 mg into feeding tube daily at 12 noon.      Marland Kitchen doxycycline (VIBRA-TABS) 100 MG tablet Start date 4/25 give x 12 days ttoal.      . Flora-Q (FLORA-Q) CAPS Take 1 capsule by mouth 2 (two) times daily.      . haloperidol (HALDOL) 10 MG tablet Take 0.5 tablets (5 mg total) by mouth 4 (four) times daily as needed.      . metoprolol (LOPRESSOR) 50 MG tablet Take 1 tablet (50 mg total) by mouth 2 (two) times daily.      . predniSONE (DELTASONE) 10 MG tablet 3 tabs x 3 days, then 2 tabs x 3 days, then 1 tabs x 3 days, then stop       Current Facility-Administered Medications on File Prior  to Visit  Medication Dose Route Frequency Provider Last Rate Last Dose  . propofol (DIPRIVAN) 10 mg/mL bolus    PRN Deatra Robinson Mahony   60 mg at 01/18/12 1135  . succinylcholine (ANECTINE) injection    PRN Deatra Robinson Mahony   70 mg at 01/18/12 1135    Allergies  Allergen Reactions  . Nsaids       ROS:  13 systems were reviewed and are notable for gangrenous toe wounds and general deconditioning .  All other review of systems are unremarkable.   Examination:  Filed Vitals:   04/17/12 1513  BP: 100/60  Pulse: 92  Height: 5\' 5"  (1.651 m)  Weight: 154 lb (69.854 kg)     In general, thin appearing older women.  H&N:  left eye appears more sunken than right eye.  Cardiovascular: The patient has a regular rate and rhythm and no carotid bruits.  Fundoscopy:  Disks are flat. Vessel caliber within normal limits.  Mental status:   The patient is oriented to person, place and time.   Cranial  Nerves: Pupils are equally round and reactive to light. Visual fields full to confrontation. Extraocular movements are intact without nystagmus. Facial sensation and muscles of mastication are intact. Muscles of facial expression are symmetric. Hearing intact to bilateral finger rub. Tongue protrusion, uvula, palate midline.  Shoulder shrug intact  Motor:  The patient has normal bulk and tone, no pronator drift.  There are no adventitious movements.  5/5 in UE except for triceps which are 4+/5.  4+/5 diffusely in LE.  Reflexes:   Biceps  Triceps Brachioradialis Knee Ankle  Right 2+  2+  2+   2+ 0  Left  2+  2+  2+   2+ 0  Toes equivocal bilaterally.  Coordination:  Normal finger to nose.  No dysdiadokinesia.  Sensation is intact to light touch bilaterally. I did not walk her.  EEG was reviewed and noted repetitive sharp transients in the left anterior mid temporal region, that sometimes occur singly or in runs.  I think these are wicket waves.  Impression/Recs: 1.  AMS resolved, likely toxic metabolic.  I think there is low likelihood that these were related to seizures.  I am going to repeat an EEG.  If this looks normal, then I will instruct her to withdraw medication.   We will see the patient back in 2 months.  Thank you for having [name] see Emily Harrington in consultation.  Feel free to contact me with any questions.  Lupita Raider Modesto Charon, MD [facility], [place] [address] [place], [place] [address] Phone: [phone] Fax: [phone].

## 2012-04-17 NOTE — Patient Instructions (Addendum)
Your EEG is scheduled at [facility] on Wednesday, July 10th at 3:00 pm.  Please arrive at first floor admitting fifteen minutes prior to your appointment.   [phone].

## 2012-04-20 ENCOUNTER — Telehealth: Payer: Self-pay | Admitting: Internal Medicine

## 2012-04-20 NOTE — Telephone Encounter (Signed)
That is fine but this kind of order moving forward should come from [facility], MD who is pcp

## 2012-04-20 NOTE — Telephone Encounter (Signed)
Please advise Dr Ramaswamy thanks.  

## 2012-04-20 NOTE — Telephone Encounter (Signed)
I spoke with Erie Noe and is aware ok for now but for future needs to come from PCP. Nothing further was needed

## 2012-04-24 ENCOUNTER — Telehealth: Payer: Self-pay | Admitting: Internal Medicine

## 2012-04-24 MED ORDER — DOXYCYCLINE HYCLATE 100 MG PO TABS: 100.0000 mg | ORAL_TABLET | Freq: Two times a day (BID) | ORAL | Status: AC

## 2012-04-24 MED ORDER — PREDNISONE 10 MG PO TABS: ORAL_TABLET | ORAL | Status: DC

## 2012-04-24 NOTE — Telephone Encounter (Signed)
Doxy 100 mg twice daily x 10 days  Prednisone 10 mg take  4 each am x 2 days,   2 each am x 2 days,  1 each am x2days and stop  To er if condition worsens over w/e

## 2012-04-24 NOTE — Telephone Encounter (Signed)
Pt's daughter Marcelino Duster called back to add the following: pt is also wheezing. Hazel Sams

## 2012-04-24 NOTE — Telephone Encounter (Signed)
Called, spoke with pt's daughter, Marcelino Duster, who states pt is coughing more, feels congestion, and having some increased SOB - worse with exertion, and some chest tightness since Tuesday.  Pt is using albuterol inhaler more.  Cough prod today with greenish brown mucus.  Denies fever or wheeze.  Has pending OV with MR on July 9.  Cannot bring pt in today.  Requesting abx.  As MR is off today, will forward msg to doc of the day to address.  Dr. Vassie Loll, pls advise.  Thank you.  Allergies verified  Allergies  Allergen Reactions  . Nsaids     [facility]

## 2012-04-24 NOTE — Telephone Encounter (Signed)
Spoke with Emily Harrington and notified of recs per MW. She verbalized understanding. Rxs were sent to pharm.

## 2012-04-27 ENCOUNTER — Other Ambulatory Visit: Payer: Self-pay | Admitting: Neurology

## 2012-04-28 ENCOUNTER — Ambulatory Visit (INDEPENDENT_AMBULATORY_CARE_PROVIDER_SITE_OTHER): Payer: Medicaid Other | Admitting: Internal Medicine

## 2012-04-28 ENCOUNTER — Ambulatory Visit (INDEPENDENT_AMBULATORY_CARE_PROVIDER_SITE_OTHER)
Admission: RE | Admit: 2012-04-28 | Discharge: 2012-04-28 | Disposition: A | Discharge status: Home / Self Care | Payer: Medicaid Other | Source: Ambulatory Visit | Attending: Internal Medicine | Admitting: Internal Medicine

## 2012-04-28 ENCOUNTER — Ambulatory Visit (HOSPITAL_COMMUNITY): Payer: Medicaid Other

## 2012-04-28 VITALS — BP 118/74 | HR 73 | Temp 99.2°F | Ht 65.0 in | Wt 152.0 lb

## 2012-04-28 DIAGNOSIS — J449 Chronic obstructive pulmonary disease, unspecified: Secondary | ICD-10-CM

## 2012-04-28 DIAGNOSIS — J9620 Acute and chronic respiratory failure, unspecified whether with hypoxia or hypercapnia: Secondary | ICD-10-CM

## 2012-04-28 DIAGNOSIS — J189 Pneumonia, unspecified organism: Secondary | ICD-10-CM

## 2012-04-28 DIAGNOSIS — J9690 Respiratory failure, unspecified, unspecified whether with hypoxia or hypercapnia: Secondary | ICD-10-CM

## 2012-04-28 LAB — PULMONARY FUNCTION TEST

## 2012-04-28 MED ORDER — TIOTROPIUM BROMIDE MONOHYDRATE 18 MCG IN CAPS: 18.0000 ug | ORAL_CAPSULE | Freq: Every day | RESPIRATORY_TRACT | Status: DC

## 2012-04-28 MED ORDER — BUDESONIDE-FORMOTEROL FUMARATE 160-4.5 MCG/ACT IN AERO: 2.0000 | INHALATION_SPRAY | Freq: Two times a day (BID) | RESPIRATORY_TRACT | Status: DC

## 2012-04-28 MED ORDER — PREDNISONE 10 MG PO TABS: ORAL_TABLET | ORAL | Status: DC

## 2012-04-28 NOTE — Progress Notes (Signed)
Subjective:    Patient ID: Emily Harrington, female    DOB: 30-Oct-1950, 61 y.o.   MRN: [id number]  HPI OV .03/06/12  61 year old female. Body mass index is 27.36 kg/(m^2).  reports that she has never smoked. She does not have any smokeless tobacco history on file.    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 Right side (seen by Dr Vale Haven)   - dry gangrene of toes (seen by Dr Hart Rochester)     Presents 03/06/12 for followup of above. Now at rehab. Per aide  no more confusion. Still o2 using. Ability to do ADLs +. Still deconditoned but improving fast. Denies cough, fever, chills, sputum, edema, hemoptysis, dyspnea. Fatigue still + but improving. Eating by self Denies smoking. CXR today (personally reviewed) shows improving RUL peripheral opacity.   REC   Glad you are doing better  Continue your medications as listed  Keep up followup appointments  Recommend [facility] test your oxygen levels with you off oxygen both at rest and exertion.  Have chest xray today (ordered)  Have spirometry at time of followup in 2 months with Jerolyn Shin our respiratory therapist  Return in 2 months   OV 04/28/2012  Followup post ICU stay. She is here with daughter. She has continued to improve. Living with daugther now. ECOG is 3 (was 4). Able to do some ADL but still not capable of living by self per daughter. Continues with o2 and RA at rest was 90% today and is interested in seeing if she can come off o2. LAst few days bronchitis symptoms and spirometry shows  severe obstruction with significant BD response; they both deny she was ever a smoker. No other symptoms. CXR also shows hyperfinlation  LABS  PFT 04/28/12: fev1 0.8L/35%, BD resposne 45%, RAtio 46  CXR  Dg Chest 2 View  04/28/2012  *RADIOLOGY REPORT*  Clinical Data: Pneumonia, congestion, shortness of breath, chest pain, asthma, former smoker, acute and chronic respiratory failure  CHEST - 2 VIEW  Comparison: 03/06/2012  Findings: Upper normal heart size. Tortuous aorta. Pulmonary vascularity mediastinal contours normal. Lungs appear slightly hyperinflated but clear. No pleural effusion or pneumothorax. Faint nodular densities at the anterior ribs bilaterally likely healing fractures. Bones appear demineralized.  IMPRESSION: Hyperinflated lungs question COPD. Probable bilateral anterior healing rib fractures.  Original Report Authenticated By: Lollie Marrow, M.D.      Current outpatient prescriptions:albuterol (PROVENTIL HFA;VENTOLIN HFA) 108 (90 BASE) MCG/ACT inhaler, Inhale 2 puffs into the lungs every 4 (four) hours as needed. For wheezing or shortness of breath., Disp: , Rfl: ;  aspirin EC 81 MG tablet, Take 162 mg by mouth daily., Disp: , Rfl: ;  cloNIDine (CATAPRES - DOSED IN MG/24 HR) 0.1 mg/24hr patch, Place 1 patch onto the skin once a week., Disp: , Rfl:  divalproex (DEPAKOTE) 500 MG DR tablet, Take 1 tablet (500 mg total) by mouth every 12 (twelve) hours., Disp: , Rfl: ;  doxycycline (  VIBRA-TABS) 100 MG tablet, Take 1 tablet (100 mg total) by mouth 2 (two) times daily., Disp: 20 tablet, Rfl: 0;  Lactobacillus (ACIDOPHILUS PO), Take by mouth 2 (two) times daily., Disp: , Rfl: ;  metoprolol (LOPRESSOR) 50 MG tablet, Take 50 mg by mouth daily., Disp: , Rfl:  Multiple Vitamin (MULTIVITAMIN) capsule, Take 1 capsule by mouth daily. , Disp: , Rfl: ;  predniSONE (DELTASONE) 10 MG tablet, 4 x 2 days, 2 x 2 days, 1 x 2 days, then stop, Disp: 14 tablet, Rfl: 0;  DISCONTD: metoprolol (LOPRESSOR) 50 MG  tablet, Take 1 tablet (50 mg total) by mouth 2 (two) times daily., Disp: , Rfl:  No current facility-administered medications for this visit.  Review of Systems  Constitutional: Negative for fever and unexpected weight change.  HENT: Negative for ear pain, nosebleeds, congestion, sore throat, rhinorrhea, sneezing, trouble swallowing, dental problem, postnasal drip and sinus pressure.   Eyes: Negative for redness and itching.  Respiratory: Positive for cough and shortness of breath. Negative for chest tightness and wheezing.   Cardiovascular: Negative for palpitations and leg swelling.  Gastrointestinal: Positive for nausea. Negative for vomiting.  Genitourinary: Negative for dysuria.  Musculoskeletal: Negative for joint swelling.  Skin: Negative for rash.  Neurological: Negative for headaches.  Hematological: Does not bruise/bleed easily.  Psychiatric/Behavioral: Negative for dysphoric mood. The patient is not nervous/anxious.        Objective:   Physical Exam Gen: Looks better but still deconditoned. Sitting in wheel chair Neuro: Alert and Oriented x 3. Best ever that I have known her HEENT: No trach scar. PERRL +. No neck nodes RESP CTA Bilaterally CVS: S1S2 +. No murmurs. No elevated jvp PA: Soft. No mass. Normal bowel sounds EXT: No cyanosis. No clubbing. No edema MSK: Deconditioned SKIN: intact        Assessment & Plan:

## 2012-04-28 NOTE — Progress Notes (Signed)
Spirometry before and after done today. 

## 2012-04-28 NOTE — Patient Instructions (Addendum)
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 Have CXR today Continue the 10 day doxycycline started on 04/24/12 I am changing the prednisone though to Please take Take prednisone 40mg  once daily x 3 days, then 30mg  once daily x 3 days, then 20mg  once daily x 3 days, then prednisone 10mg  once daily  x 3 days and stop Nurse will test oxygen level on room air in 10 minutes  - if  >88%, then she will order overnight oxygen study Please start spiriva 1 puff daily - take sample, script and show technique Please start symbicort 160/4.5 2 puff twice daily - take sample, script and show technique Return in 3 weeks to see NP Emily Harrington for med calendar and review

## 2012-04-29 ENCOUNTER — Ambulatory Visit (HOSPITAL_COMMUNITY): Payer: Medicaid Other

## 2012-05-01 ENCOUNTER — Encounter: Payer: Self-pay | Admitting: Internal Medicine

## 2012-05-01 DIAGNOSIS — J449 Chronic obstructive pulmonary disease, unspecified: Secondary | ICD-10-CM | POA: Insufficient documentation

## 2012-05-01 NOTE — Assessment & Plan Note (Signed)
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 Have CXR today Continue the 10 day doxycycline started on 04/24/12 I am changing the prednisone though to Please take Take prednisone 40mg once daily x 3 days, then 30mg once daily x 3 days, then 20mg once daily x 3 days, then prednisone 10mg once daily  x 3 days and stop Nurse will test oxygen level on room air in 10 minutes  - if  >88%, then she will order overnight oxygen study Please start spiriva 1 puff daily - take sample, script and show technique Please start symbicort 160/4.5 2 puff twice daily - take sample, script and show technique Return in 3 weeks to see NP Tammy for med calendar and review  

## 2012-05-06 ENCOUNTER — Ambulatory Visit (HOSPITAL_COMMUNITY): Payer: Medicaid Other

## 2012-05-06 NOTE — Progress Notes (Signed)
Quick Note:  Called, spoke with [place]. Was advised pt is unable to come to the phone right now. Asked she pls have pt call office back at her convienance. Marcelino Duster verbalized understanding of this. ______

## 2012-05-19 ENCOUNTER — Ambulatory Visit (HOSPITAL_COMMUNITY): Payer: Medicaid Other

## 2012-05-19 ENCOUNTER — Encounter: Payer: Medicaid Other | Admitting: Adult Health

## 2012-05-26 ENCOUNTER — Telehealth: Payer: Self-pay | Admitting: Internal Medicine

## 2012-05-26 DIAGNOSIS — J45909 Unspecified asthma, uncomplicated: Secondary | ICD-10-CM

## 2012-05-26 NOTE — Telephone Encounter (Signed)
Pt advised and order sent. Carron Curie, CMA

## 2012-05-26 NOTE — Telephone Encounter (Signed)
ONO  05/17/12 - very minimal transient desats only. DC o2 at night

## 2012-05-28 ENCOUNTER — Encounter: Payer: Self-pay | Admitting: Internal Medicine

## 2012-06-01 ENCOUNTER — Telehealth: Payer: Self-pay | Admitting: Neurology

## 2012-06-01 NOTE — Telephone Encounter (Signed)
Pt's eeg rescheduled for Friday, August 16 at 9:30am at [facility].  Pt's daughter aware.

## 2012-06-01 NOTE — Telephone Encounter (Signed)
Pt's daughter called to see if we could move pt's EEG to [facility] instead of [place]. Pt's daughter lives in [place] and getting her there would be easier. If they have any openings for tomorrow (Tuesday, Aug. 13), pt's daughter is already off work. If not, schedule appointment for another day and call daughter to let her know.

## 2012-06-03 ENCOUNTER — Ambulatory Visit (HOSPITAL_COMMUNITY): Payer: Medicaid Other

## 2012-06-05 ENCOUNTER — Ambulatory Visit: Payer: Self-pay | Admitting: Neurology

## 2012-06-16 ENCOUNTER — Ambulatory Visit: Payer: Self-pay | Admitting: Urology

## 2012-06-26 ENCOUNTER — Ambulatory Visit: Payer: Medicaid Other | Admitting: Neurology

## 2012-07-01 ENCOUNTER — Telehealth: Payer: Self-pay
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
## 2012-07-02 ENCOUNTER — Ambulatory Visit: Payer: Medicaid Other | Admitting: Neurology

## 2012-07-02 NOTE — Progress Notes (Signed)
Received report of EEG from outside hospital.  Scanned in system.  It suggested sharply contoured activity in the temporal region which in my mind is non specific.  I have asked patient to get a copy of the disk so that Dr. Arbutus Leas can read and determine if she thinks she is able to come off anti-convulsants.  The patient will need to follow up once with Dr. Arbutus Leas

## 2012-09-08 IMAGING — CR DG ABD PORTABLE 1V
1 series · 1 of 1 positions shown · non-contrast
Comparison: Abdominal radiograph performed 01/29/2012

CLINICAL DATA: Panda tube placement.

PORTABLE ABDOMEN - 1 VIEW

[AP]
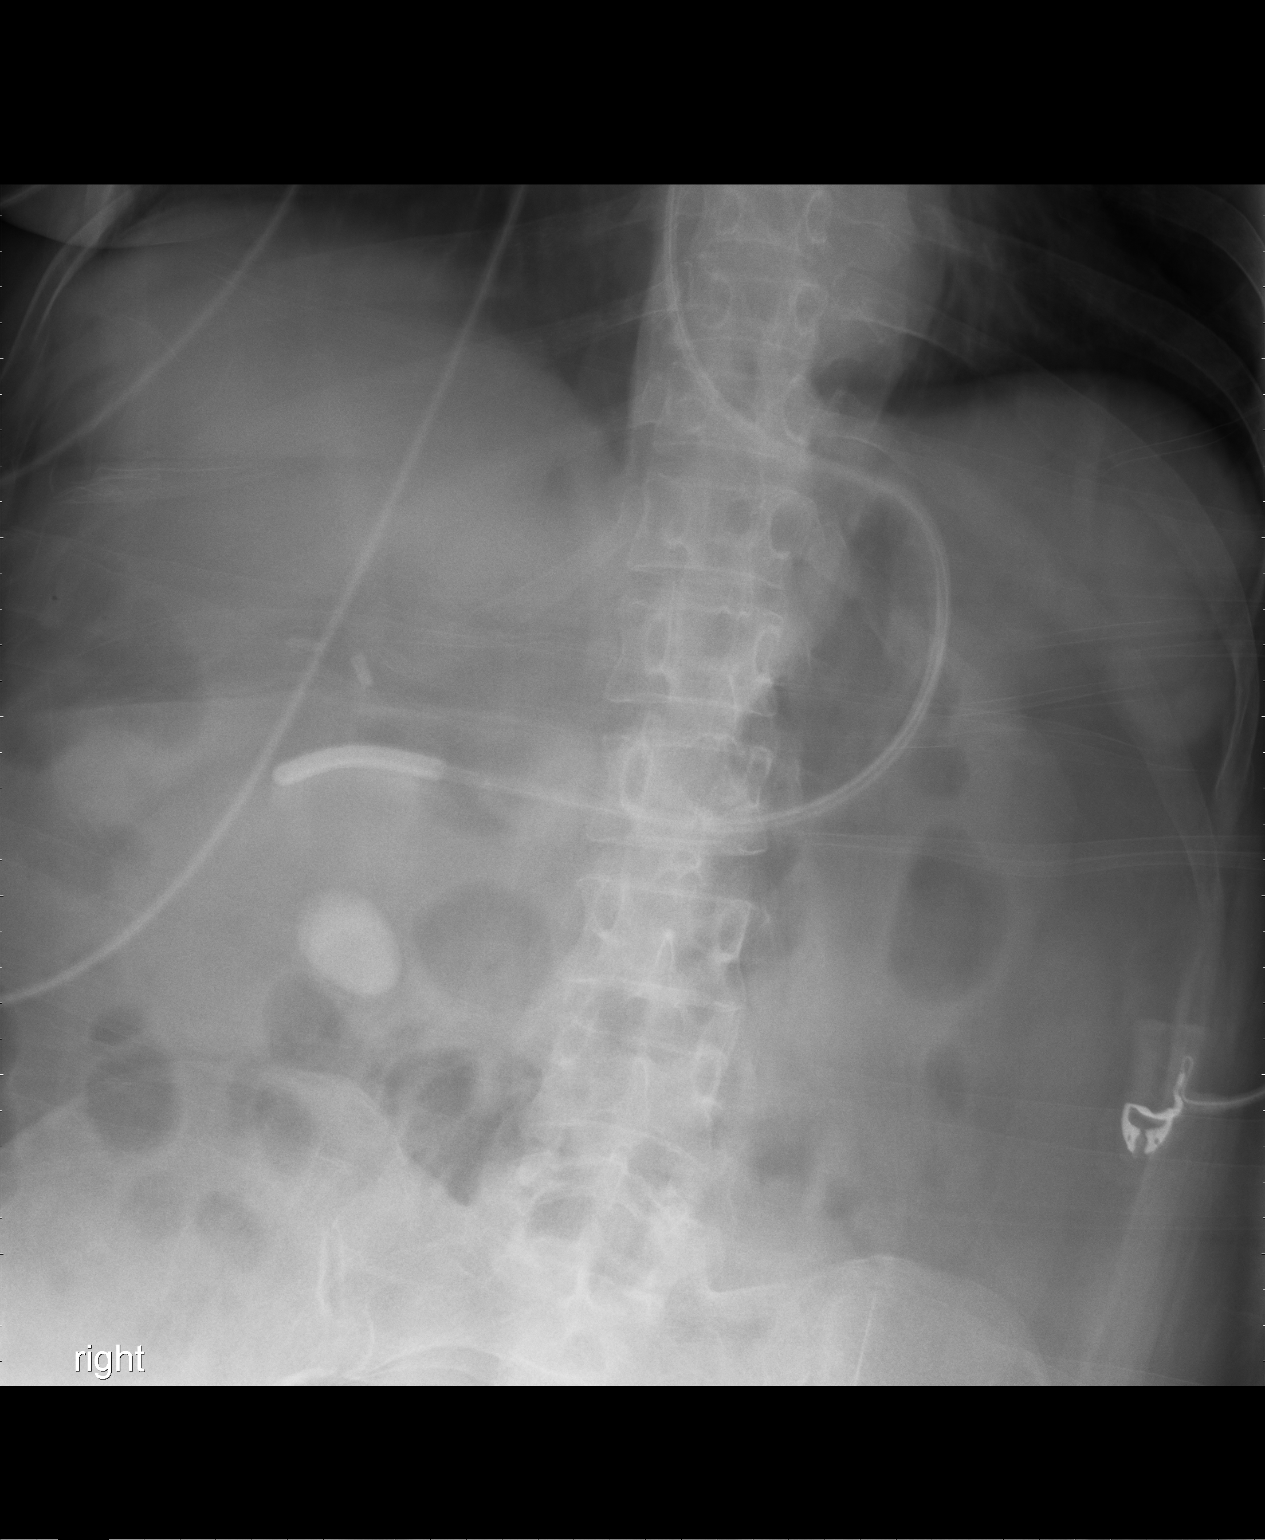

[1 of 1 positions shown; findings below may reference images not displayed]

FINDINGS: The patient's Panda tube is noted ending about the
pylorus.

The visualized bowel gas pattern is grossly unremarkable.  A large
stone is again noted at the right renal pelvis.  No free intra-
abdominal air is identified, though evaluation for free air is
suboptimal on a single supine view.

No acute osseous abnormalities are seen.
IMPRESSION: Panda tube noted ending about the pylorus.

## 2012-09-09 IMAGING — CR DG CHEST 1V PORT
1 series · 1 of 1 positions shown · non-contrast
Comparison: 01/31/2012; 01/29/2012; 01/28/2012; 01/21/2012;
01/16/2012; chest CT - 01/17/2012

CLINICAL DATA: Shortness of breath, evaluate pneumonia

PORTABLE CHEST - 1 VIEW

[AP]
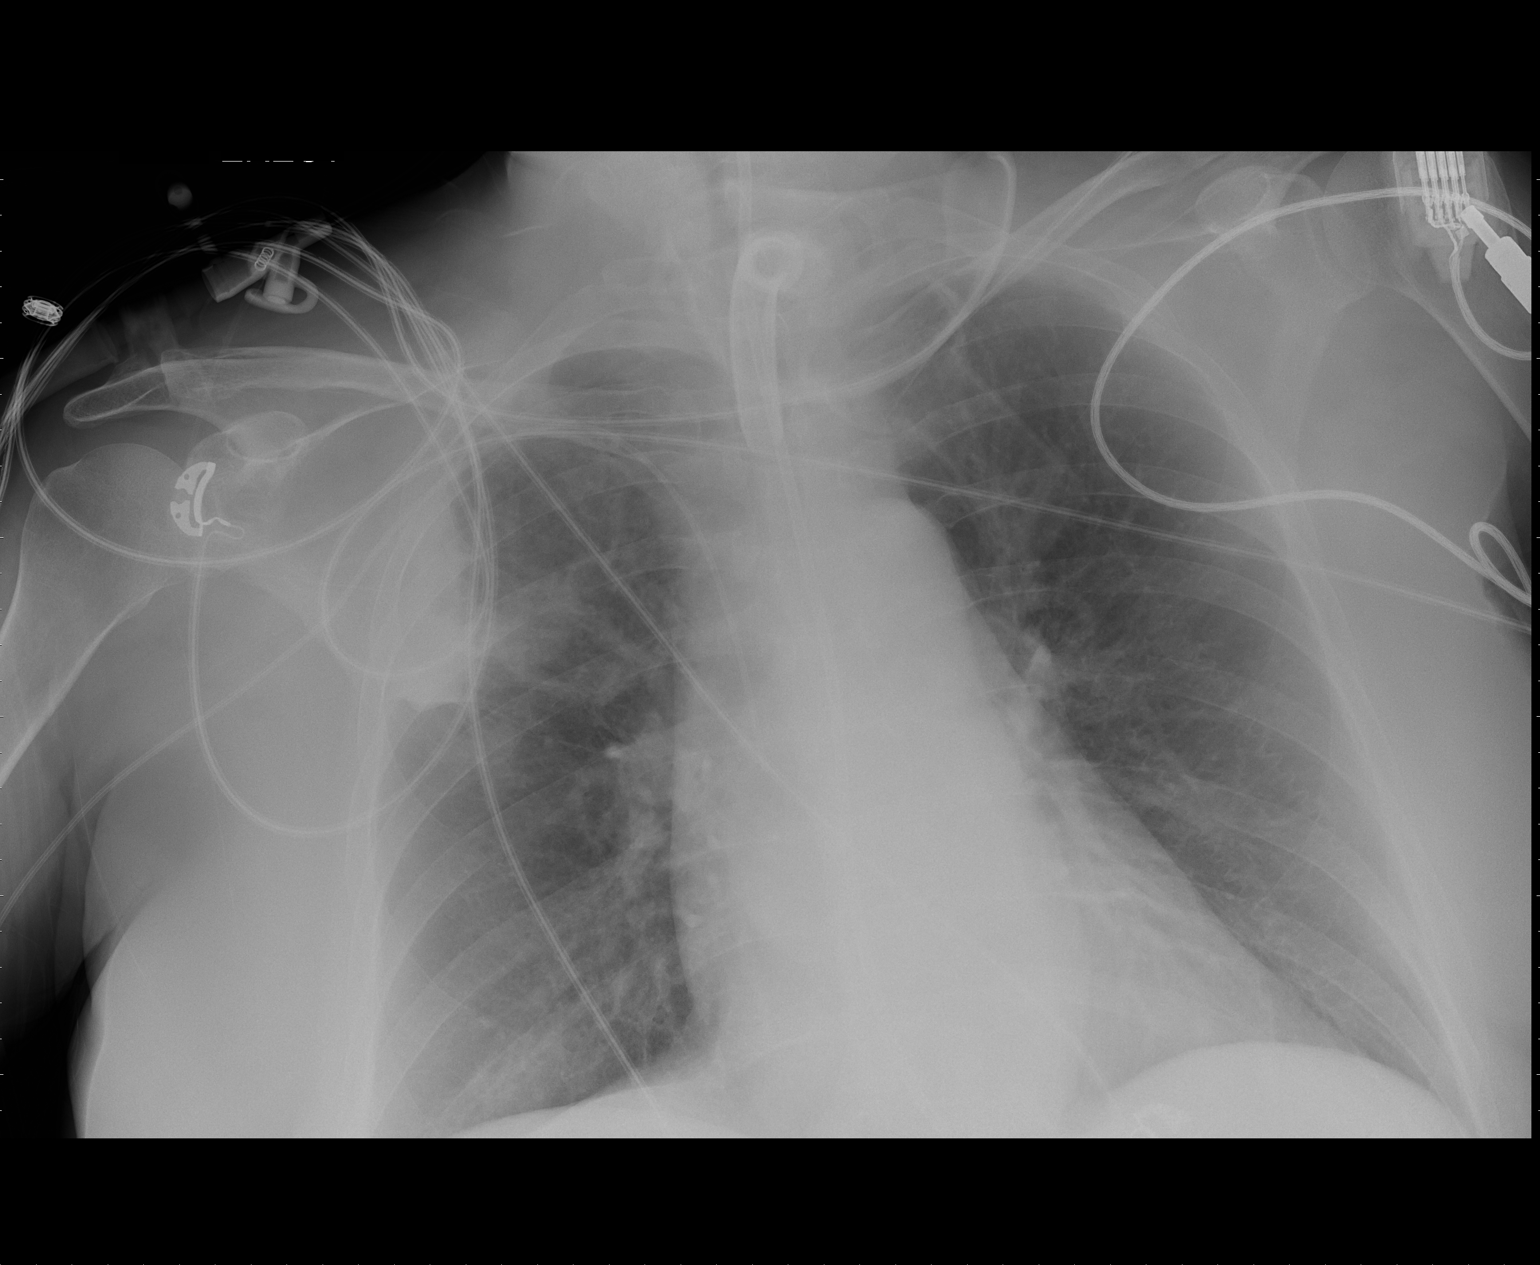

[1 of 1 positions shown; findings below may reference images not displayed]

FINDINGS: Grossly unchanged cardiac silhouette and mediastinal contours.
Stable positioning of support apparatus. Peripheral consolidative
opacity within the right upper lobe is grossly unchanged.  No
definite pleural effusion or pneumothorax.  Grossly unchanged
bones.
IMPRESSION: 1.  Stable positioning of support apparatus.  No pneumothorax.
2.  Grossly unchanged consolidative opacity within the right upper
lobe.]

## 2012-09-18 ENCOUNTER — Emergency Department: Payer: Self-pay | Admitting: Emergency Medicine

## 2012-09-18 LAB — COMPREHENSIVE METABOLIC PANEL
Albumin: 2.1 g/dL — ABNORMAL LOW (ref 3.4–5.0)
Alkaline Phosphatase: 199 U/L — ABNORMAL HIGH (ref 50–136)
Anion Gap: 7 (ref 7–16)
Calcium, Total: 9.5 mg/dL (ref 8.5–10.1)
EGFR (African American): 59 — ABNORMAL LOW
EGFR (Non-African Amer.): 51 — ABNORMAL LOW
Glucose: 112 mg/dL — ABNORMAL HIGH (ref 65–99)
Potassium: 4.3 mmol/L (ref 3.5–5.1)
SGOT(AST): 21 U/L (ref 15–37)
Sodium: 138 mmol/L (ref 136–145)

## 2012-09-18 LAB — URINALYSIS, COMPLETE
Bilirubin,UR: NEGATIVE
Glucose,UR: NEGATIVE mg/dL (ref 0–75)
Hyaline Cast: 4
Ketone: NEGATIVE
RBC,UR: 6 /HPF (ref 0–5)
Specific Gravity: 1.014 (ref 1.003–1.030)
Squamous Epithelial: NONE SEEN
WBC UR: 10 /HPF (ref 0–5)

## 2012-09-18 LAB — CBC
HCT: 35 % (ref 35.0–47.0)
MCV: 94 fL (ref 80–100)
Platelet: 282 10*3/uL (ref 150–440)
RBC: 3.74 10*6/uL — ABNORMAL LOW (ref 3.80–5.20)
RDW: 16 % — ABNORMAL HIGH (ref 11.5–14.5)
WBC: 14.4 10*3/uL — ABNORMAL HIGH (ref 3.6–11.0)

## 2012-09-18 IMAGING — CR DG CHEST 1V PORT
1 series · 1 of 1 positions shown · non-contrast
Comparison: 02/07/2012.

CLINICAL DATA: Right upper lobe loculated effusion.

PORTABLE CHEST - 1 VIEW

[AP]
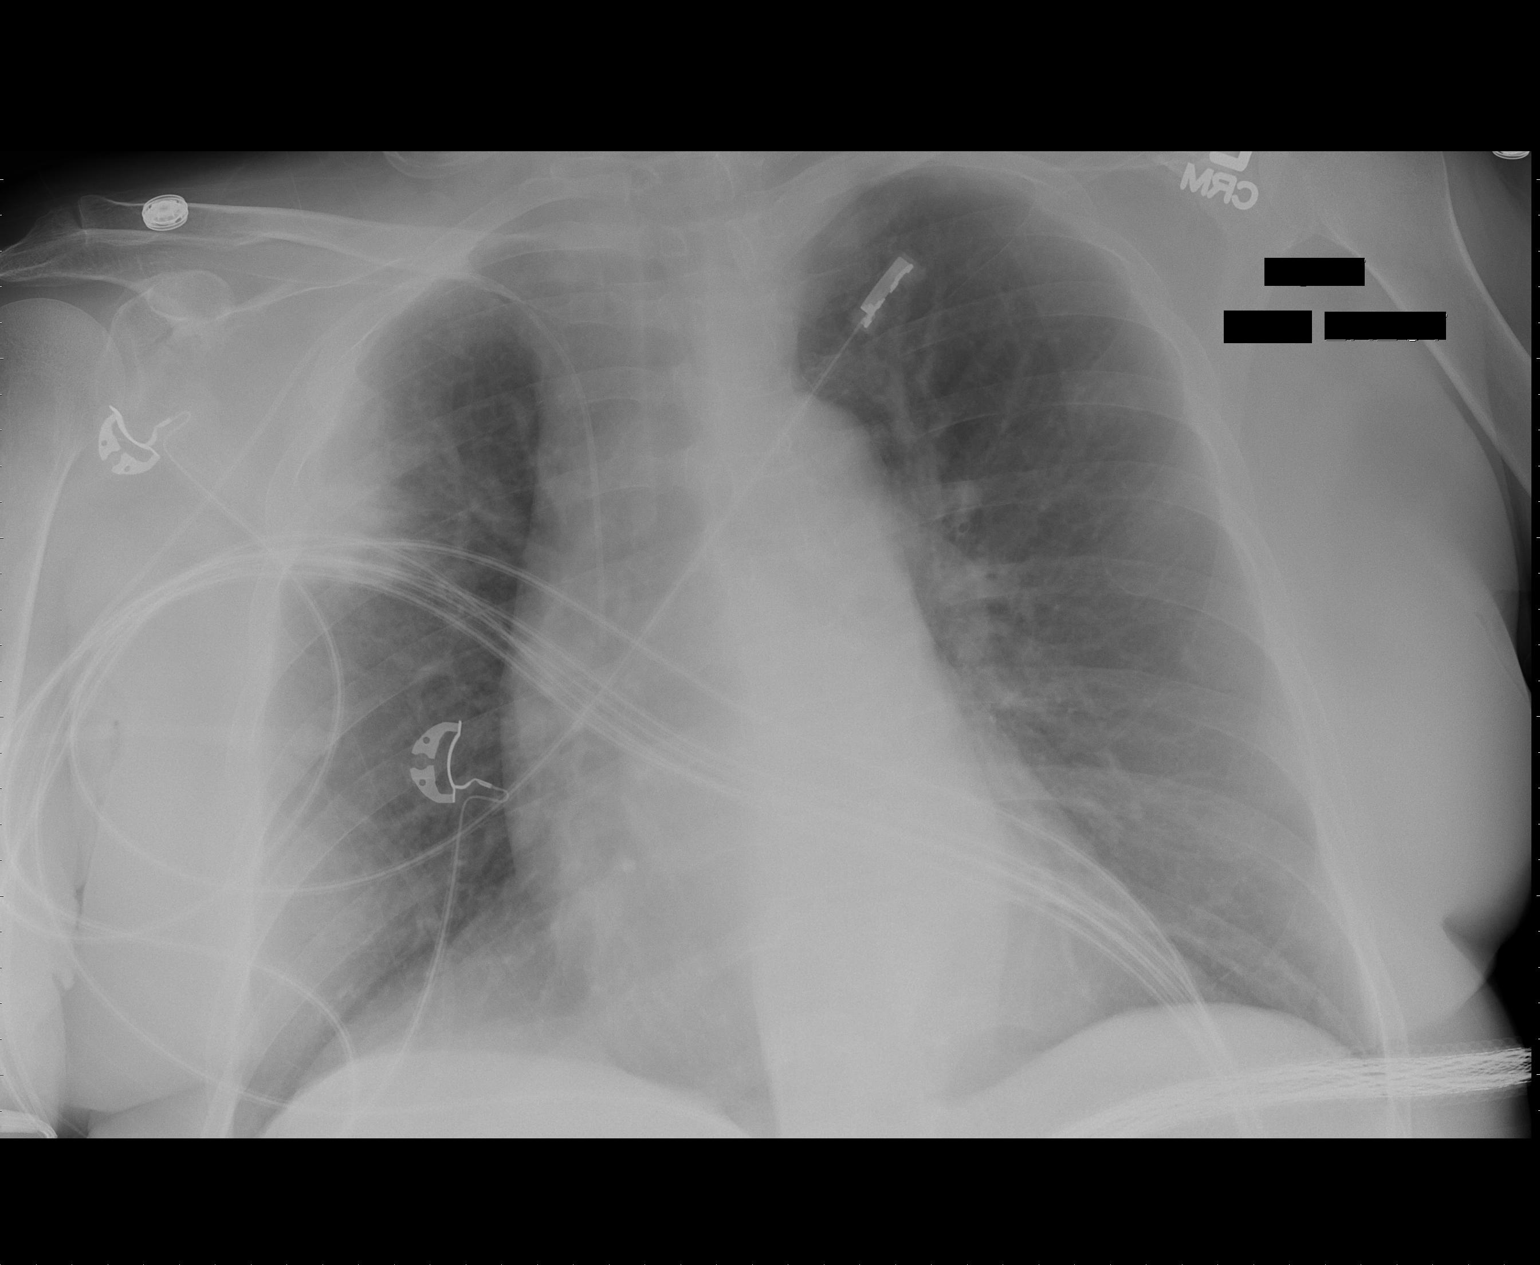

[1 of 1 positions shown; findings below may reference images not displayed]

FINDINGS: Wedge-shaped consolidation peripheral aspect right upper
lobe remains.  It is possible this represents residua of pneumonia
although neoplasm or result of pulmonary embolus not excluded.

Right central line tip mid superior vena cava level.  No gross
pneumothorax.

Rotation to the right.  Central pulmonary vascular prominence.
Slightly tortuous aorta.  Mild cardiomegaly.

Tracheostomy tube has been removed.
IMPRESSION: Tracheostomy tube has been removed.

Persistent wedge-shaped opacity peripheral aspect right upper lobe
as noted above.

## 2012-09-23 LAB — CULTURE, BLOOD (SINGLE)

## 2012-10-11 IMAGING — CR DG CHEST 2V
2 series · 2 of 2 positions shown · non-contrast
Comparison: Chest x-ray 02/12/2012.  Chest CT 02/12/2012.

CLINICAL DATA: Pneumonia follow up.

CHEST - 2 VIEW

[view not recorded (1 of 2)]
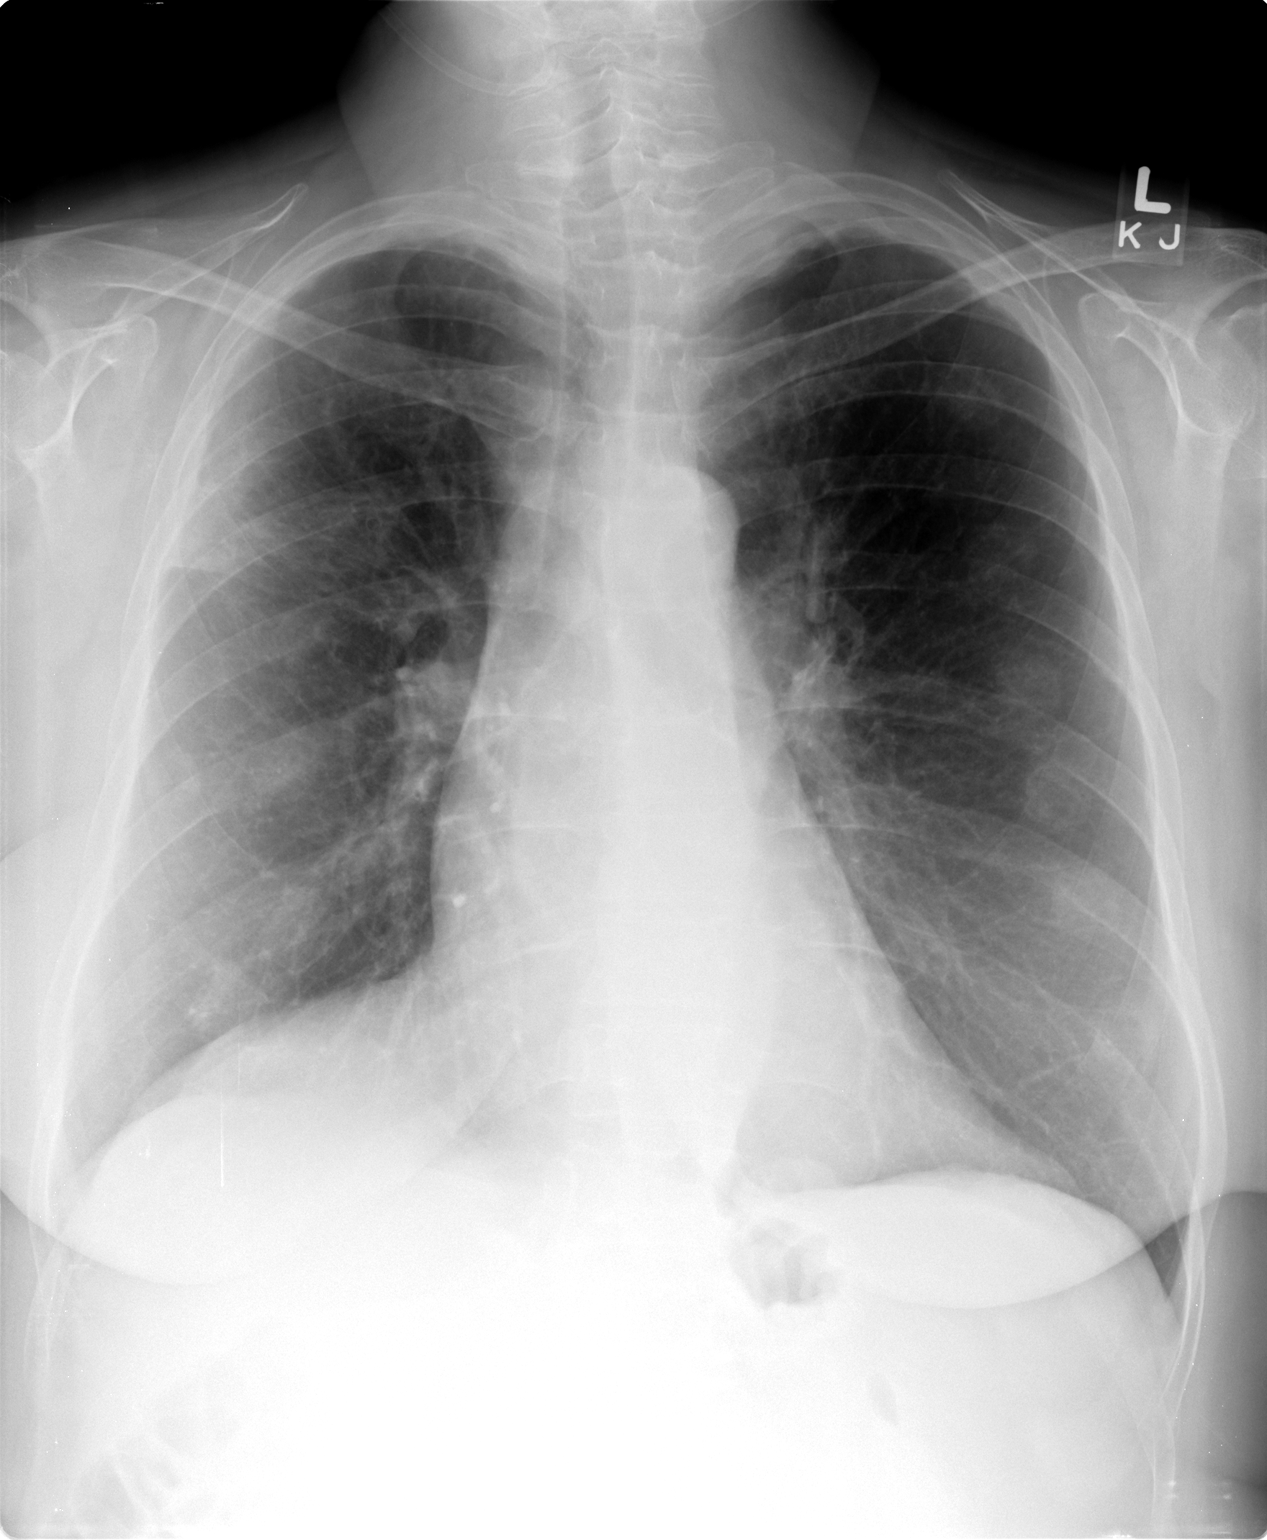

[view not recorded (2 of 2)]
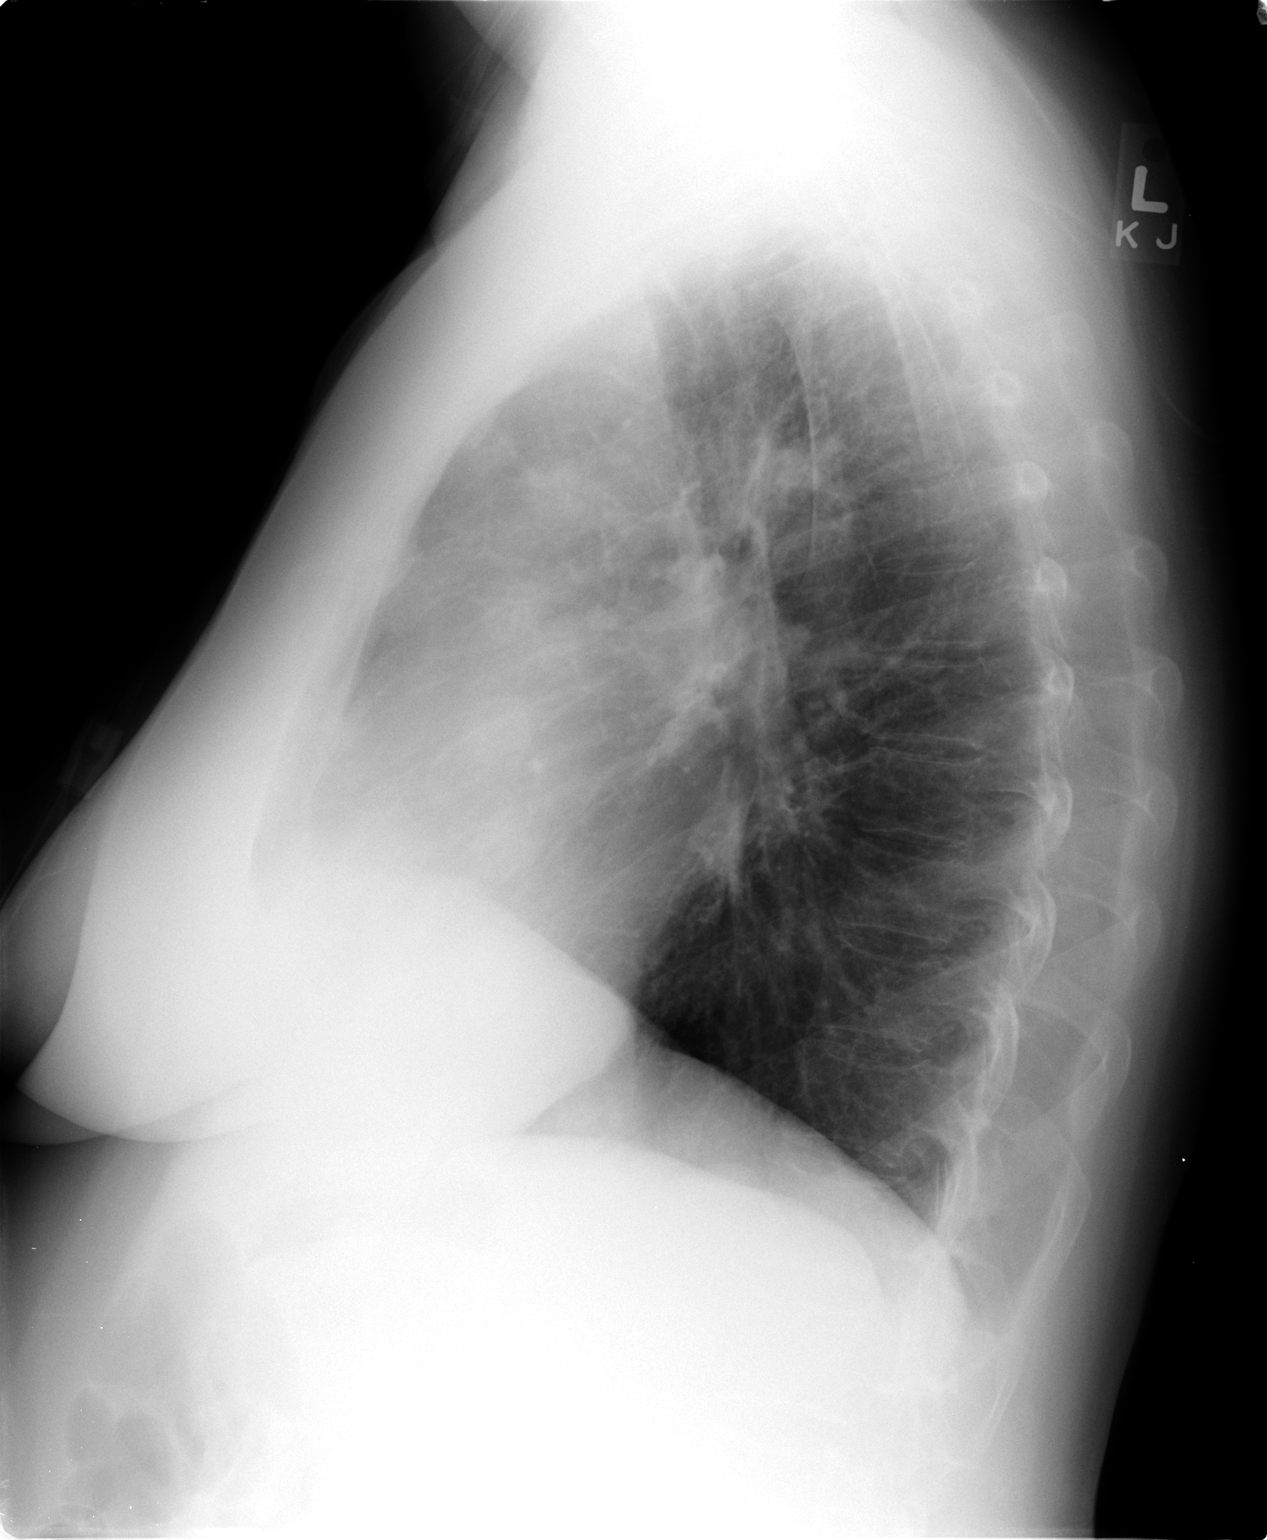

[2 of 2 positions shown; findings below may reference images not displayed]

FINDINGS: When compared to recent chest x-ray 02/12/2012, the
peripheral pleural based airspace consolidation in the right upper
lobe is slightly less conspicuous, suggesting some interval
clearing.  There continues be some component of volume loss in the
right upper lobe, as evidenced by cephalad displacement of the
horizontal fissure.  Lungs otherwise appear clear.  No pleural
effusions.  Multiple healing bilateral rib fractures
anterolaterally are again noted.  Pulmonary vasculature and
cardiomediastinal silhouette are within normal limits.
IMPRESSION: 1.  Slight decreased conspicuity of peripheral air space
consolidation in the right upper lobe, suggesting continued
resolution of pneumonia and resolving scarring.
2.  Persistent component of atelectasis in the right upper lobe.
3.  Multiple healing bilateral rib fractures again noted.

## 2014-06-14 ENCOUNTER — Emergency Department: Payer: Self-pay | Admitting: Internal Medicine

## 2014-06-14 ENCOUNTER — Emergency Department (HOSPITAL_COMMUNITY): Payer: No Typology Code available for payment source

## 2014-06-14 ENCOUNTER — Inpatient Hospital Stay (HOSPITAL_COMMUNITY)
Admission: EM | Admit: 2014-06-14 | Discharge: 2014-06-21 | DRG: 184 | Disposition: A | Discharge status: Rehabilitation Facility | Payer: No Typology Code available for payment source | Attending: General Surgery | Admitting: General Surgery

## 2014-06-14 ENCOUNTER — Encounter (HOSPITAL_COMMUNITY): Payer: Self-pay | Admitting: Emergency Medicine

## 2014-06-14 DIAGNOSIS — Z7982 Long term (current) use of aspirin: Secondary | ICD-10-CM | POA: Diagnosis not present

## 2014-06-14 DIAGNOSIS — K5900 Constipation, unspecified: Secondary | ICD-10-CM | POA: Diagnosis not present

## 2014-06-14 DIAGNOSIS — S2249XA Multiple fractures of ribs, unspecified side, initial encounter for closed fracture: Secondary | ICD-10-CM

## 2014-06-14 DIAGNOSIS — IMO0002 Reserved for concepts with insufficient information to code with codable children: Secondary | ICD-10-CM | POA: Diagnosis not present

## 2014-06-14 DIAGNOSIS — I252 Old myocardial infarction: Secondary | ICD-10-CM | POA: Diagnosis not present

## 2014-06-14 DIAGNOSIS — J4489 Other specified chronic obstructive pulmonary disease: Secondary | ICD-10-CM | POA: Diagnosis present

## 2014-06-14 DIAGNOSIS — D62 Acute posthemorrhagic anemia: Secondary | ICD-10-CM | POA: Diagnosis not present

## 2014-06-14 DIAGNOSIS — S32009A Unspecified fracture of unspecified lumbar vertebra, initial encounter for closed fracture: Secondary | ICD-10-CM | POA: Diagnosis present

## 2014-06-14 DIAGNOSIS — T1490XA Injury, unspecified, initial encounter: Secondary | ICD-10-CM | POA: Diagnosis not present

## 2014-06-14 DIAGNOSIS — I10 Essential (primary) hypertension: Secondary | ICD-10-CM | POA: Diagnosis present

## 2014-06-14 DIAGNOSIS — Z8674 Personal history of sudden cardiac arrest: Secondary | ICD-10-CM | POA: Diagnosis not present

## 2014-06-14 DIAGNOSIS — J449 Chronic obstructive pulmonary disease, unspecified: Secondary | ICD-10-CM | POA: Diagnosis present

## 2014-06-14 HISTORY — DX: Disorder of kidney and ureter, unspecified: N28.9

## 2014-06-14 HISTORY — DX: Other toxic encephalopathy: G92.8

## 2014-06-14 HISTORY — DX: Acute myocardial infarction, unspecified: I21.9

## 2014-06-14 HISTORY — DX: Cardiac arrest, cause unspecified: I46.9

## 2014-06-14 HISTORY — DX: Chronic obstructive pulmonary disease, unspecified: J44.9

## 2014-06-14 HISTORY — DX: Toxic encephalopathy: G92

## 2014-06-14 HISTORY — DX: Other specified postprocedural states: Z98.890

## 2014-06-14 HISTORY — DX: Calculus of kidney: N20.0

## 2014-06-14 LAB — COMPREHENSIVE METABOLIC PANEL
ALT: 78 U/L — AB
ALT: 49 U/L — ABNORMAL HIGH (ref 0–35)
ANION GAP: 8 (ref 5–15)
AST: 102 U/L — AB (ref 15–37)
AST: 75 U/L — AB (ref 0–37)
Albumin: 4 g/dL (ref 3.4–5.0)
Albumin: 2.9 g/dL — ABNORMAL LOW (ref 3.5–5.2)
Alkaline Phosphatase: 141 U/L — ABNORMAL HIGH
Alkaline Phosphatase: 97 U/L (ref 39–117)
Anion Gap: 11 (ref 7–16)
BUN: 23 mg/dL — AB (ref 7–18)
BUN: 22 mg/dL (ref 6–23)
Bilirubin,Total: 0.3 mg/dL (ref 0.2–1.0)
CALCIUM: 9.8 mg/dL (ref 8.5–10.1)
CALCIUM: 8.8 mg/dL (ref 8.4–10.5)
CHLORIDE: 106 mmol/L (ref 98–107)
CO2: 23 mmol/L (ref 21–32)
CO2: 24 mEq/L (ref 19–32)
CREATININE: 1.7 mg/dL — AB (ref 0.60–1.30)
CREATININE: 1.51 mg/dL — AB (ref 0.50–1.10)
Chloride: 110 mEq/L (ref 96–112)
EGFR (African American): 36 — ABNORMAL LOW
EGFR (Non-African Amer.): 31 — ABNORMAL LOW
GFR, EST AFRICAN AMERICAN: 41 mL/min — AB (ref >90)
GFR, EST NON AFRICAN AMERICAN: 35 mL/min — AB (ref >90)
GLUCOSE: 118 mg/dL — AB (ref 65–99)
GLUCOSE: 134 mg/dL — AB (ref 70–99)
OSMOLALITY: 284 (ref 275–301)
Potassium: 4.6 mmol/L (ref 3.5–5.1)
Potassium: 4.8 mEq/L (ref 3.7–5.3)
Sodium: 140 mmol/L (ref 136–145)
Sodium: 142 mEq/L (ref 137–147)
Total Protein: 8.2 g/dL (ref 6.4–8.2)
Total Protein: 5.7 g/dL — ABNORMAL LOW (ref 6.0–8.3)

## 2014-06-14 LAB — PROTIME-INR
INR: 1
INR: 1.24 (ref 0.00–1.49)
PROTHROMBIN TIME: 13 s (ref 11.5–14.7)
Prothrombin Time: 15.6 s — ABNORMAL HIGH (ref 11.6–15.2)

## 2014-06-14 LAB — I-STAT CHEM 8, ED
BUN: 23 mg/dL (ref 6–23)
Calcium, Ion: 1.23 mmol/L (ref 1.13–1.30)
Chloride: 111 meq/L (ref 96–112)
Creatinine, Ser: 1.6 mg/dL — ABNORMAL HIGH (ref 0.50–1.10)
Glucose, Bld: 135 mg/dL — ABNORMAL HIGH (ref 70–99)
HCT: 35 % — ABNORMAL LOW (ref 36.0–46.0)
Hemoglobin: 11.9 g/dL — ABNORMAL LOW (ref 12.0–15.0)
Potassium: 4.5 meq/L (ref 3.7–5.3)
Sodium: 140 meq/L (ref 137–147)
TCO2: 24 mmol/L (ref 0–100)

## 2014-06-14 LAB — LIPASE, BLOOD: LIPASE: 275 U/L (ref 73–393)

## 2014-06-14 LAB — CDS SEROLOGY

## 2014-06-14 LAB — CBC
HCT: 45.1 % (ref 35.0–47.0)
HCT: 34.8 % — ABNORMAL LOW (ref 36.0–46.0)
HGB: 14.3 g/dL (ref 12.0–16.0)
Hemoglobin: 11.5 g/dL — ABNORMAL LOW (ref 12.0–15.0)
MCH: 29.2 pg (ref 26.0–34.0)
MCH: 29.7 pg (ref 26.0–34.0)
MCHC: 31.7 g/dL — AB (ref 32.0–36.0)
MCHC: 33 g/dL (ref 30.0–36.0)
MCV: 92 fL (ref 80–100)
MCV: 89.9 fL (ref 78.0–100.0)
PLATELETS: 312 10*3/uL (ref 150–440)
Platelets: 218 K/uL (ref 150–400)
RBC: 4.9 10*6/uL (ref 3.80–5.20)
RBC: 3.87 MIL/uL (ref 3.87–5.11)
RDW: 14.4 % (ref 11.5–14.5)
RDW: 14.1 % (ref 11.5–15.5)
WBC: 13.6 10*3/uL — ABNORMAL HIGH (ref 3.6–11.0)
WBC: 22.4 K/uL — ABNORMAL HIGH (ref 4.0–10.5)

## 2014-06-14 LAB — SAMPLE TO BLOOD BANK

## 2014-06-14 LAB — ETHANOL: Alcohol, Ethyl (B): <11 mg/dL (ref 0–11)

## 2014-06-14 LAB — CK TOTAL AND CKMB (NOT AT ARMC)
CK, Total: 388 U/L — ABNORMAL HIGH
CK-MB: 14.1 ng/mL — ABNORMAL HIGH (ref 0.5–3.6)

## 2014-06-14 LAB — I-STAT CG4 LACTIC ACID, ED: Lactic Acid, Venous: 0.98 mmol/L (ref 0.5–2.2)

## 2014-06-14 LAB — TROPONIN I

## 2014-06-14 MED ORDER — FENTANYL CITRATE 0.05 MG/ML IJ SOLN
25.0000 ug | INTRAMUSCULAR | Status: DC | PRN
  Administered 2014-06-14 – 2014-06-15 (×2): 25 ug via INTRAVENOUS
  Filled 2014-06-14 (×2): qty 2

## 2014-06-14 MED ORDER — IOHEXOL 300 MG/ML  SOLN
100.0000 mL | Freq: Once | INTRAMUSCULAR | Status: AC | PRN
  Administered 2014-06-14: 100 mL via INTRAVENOUS

## 2014-06-14 MED ORDER — LORAZEPAM 1 MG PO TABS: 0.0000 mg | ORAL_TABLET | Freq: Two times a day (BID) | ORAL | Status: DC

## 2014-06-14 MED ORDER — LORAZEPAM 2 MG/ML IJ SOLN: 0.0000 mg | Freq: Two times a day (BID) | INTRAMUSCULAR | Status: DC

## 2014-06-14 MED ORDER — LORAZEPAM 1 MG PO TABS: 0.0000 mg | ORAL_TABLET | Freq: Four times a day (QID) | ORAL | Status: DC

## 2014-06-14 MED ORDER — LORAZEPAM 2 MG/ML IJ SOLN: 0.0000 mg | Freq: Four times a day (QID) | INTRAMUSCULAR | Status: DC

## 2014-06-14 MED ORDER — TETANUS-DIPHTH-ACELL PERTUSSIS 5-2.5-18.5 LF-MCG/0.5 IM SUSP
0.5000 mL | Freq: Once | INTRAMUSCULAR | Status: AC
  Administered 2014-06-14: 0.5 mL via INTRAMUSCULAR
  Filled 2014-06-14: qty 0.5

## 2014-06-14 NOTE — ED Notes (Signed)
Attempted report 
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
## 2014-06-14 NOTE — ED Notes (Signed)
Dr. Corliss Skains removed cervical collar.
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
## 2014-06-14 NOTE — ED Notes (Signed)
Family at beside. Family given emotional support. 

## 2014-06-15 LAB — URINALYSIS, ROUTINE W REFLEX MICROSCOPIC
Bilirubin Urine: NEGATIVE
Glucose, UA: NEGATIVE mg/dL
Ketones, ur: NEGATIVE mg/dL
Leukocytes, UA: NEGATIVE
Nitrite: NEGATIVE
Protein, ur: NEGATIVE mg/dL
Specific Gravity, Urine: >1.046 — ABNORMAL HIGH (ref 1.005–1.030)
Urobilinogen, UA: 0.2 mg/dL (ref 0.0–1.0)
pH: 5 (ref 5.0–8.0)

## 2014-06-15 LAB — COMPREHENSIVE METABOLIC PANEL WITH GFR
ALT: 55 U/L — ABNORMAL HIGH (ref 0–35)
AST: 94 U/L — ABNORMAL HIGH (ref 0–37)
Albumin: 3 g/dL — ABNORMAL LOW (ref 3.5–5.2)
Alkaline Phosphatase: 99 U/L (ref 39–117)
Anion gap: 9 (ref 5–15)
BUN: 20 mg/dL (ref 6–23)
CO2: 23 meq/L (ref 19–32)
Calcium: 8.8 mg/dL (ref 8.4–10.5)
Chloride: 109 meq/L (ref 96–112)
Creatinine, Ser: 1.38 mg/dL — ABNORMAL HIGH (ref 0.50–1.10)
GFR calc Af Amer: 46 mL/min — ABNORMAL LOW
GFR calc non Af Amer: 39 mL/min — ABNORMAL LOW
Glucose, Bld: 176 mg/dL — ABNORMAL HIGH (ref 70–99)
Potassium: 5.5 meq/L — ABNORMAL HIGH (ref 3.7–5.3)
Sodium: 141 meq/L (ref 137–147)
Total Bilirubin: <0.2 mg/dL — ABNORMAL LOW (ref 0.3–1.2)
Total Protein: 6 g/dL (ref 6.0–8.3)

## 2014-06-15 LAB — CBC
HEMATOCRIT: 33.6 % — AB (ref 36.0–46.0)
HEMOGLOBIN: 10.8 g/dL — AB (ref 12.0–15.0)
MCH: 29.3 pg (ref 26.0–34.0)
MCHC: 32.1 g/dL (ref 30.0–36.0)
MCV: 91.3 fL (ref 78.0–100.0)
Platelets: 203 10*3/uL (ref 150–400)
RBC: 3.68 MIL/uL — AB (ref 3.87–5.11)
RDW: 14.4 % (ref 11.5–15.5)
WBC: 13.8 10*3/uL — ABNORMAL HIGH (ref 4.0–10.5)

## 2014-06-15 LAB — MRSA PCR SCREENING: MRSA by PCR: NEGATIVE

## 2014-06-15 LAB — URINE MICROSCOPIC-ADD ON

## 2014-06-15 MED ORDER — ENOXAPARIN SODIUM 30 MG/0.3ML ~~LOC~~ SOLN
30.0000 mg | SUBCUTANEOUS | Status: DC
  Administered 2014-06-15 – 2014-06-16 (×2): 30 mg via SUBCUTANEOUS
  Filled 2014-06-15 (×2): qty 0.3

## 2014-06-15 MED ORDER — BUDESONIDE-FORMOTEROL FUMARATE 160-4.5 MCG/ACT IN AERO
2.0000 | INHALATION_SPRAY | Freq: Two times a day (BID) | RESPIRATORY_TRACT | Status: DC
  Administered 2014-06-15 – 2014-06-21 (×12): 2 via RESPIRATORY_TRACT
  Filled 2014-06-15 (×2): qty 6

## 2014-06-15 MED ORDER — METHOCARBAMOL 500 MG PO TABS
750.0000 mg | ORAL_TABLET | Freq: Three times a day (TID) | ORAL | Status: DC | PRN
  Administered 2014-06-15 – 2014-06-17 (×3): 750 mg via ORAL
  Filled 2014-06-15: qty 1
  Filled 2014-06-15: qty 2
  Filled 2014-06-15: qty 1

## 2014-06-15 MED ORDER — ONDANSETRON HCL 4 MG/2ML IJ SOLN: 4.0000 mg | Freq: Four times a day (QID) | INTRAMUSCULAR | Status: DC | PRN

## 2014-06-15 MED ORDER — PANTOPRAZOLE SODIUM 40 MG PO TBEC
40.0000 mg | DELAYED_RELEASE_TABLET | Freq: Every day | ORAL | Status: DC
  Administered 2014-06-15 – 2014-06-21 (×7): 40 mg via ORAL
  Filled 2014-06-15 (×7): qty 1

## 2014-06-15 MED ORDER — ONDANSETRON HCL 4 MG PO TABS: 4.0000 mg | ORAL_TABLET | Freq: Four times a day (QID) | ORAL | Status: DC | PRN

## 2014-06-15 MED ORDER — TIOTROPIUM BROMIDE MONOHYDRATE 18 MCG IN CAPS
18.0000 ug | ORAL_CAPSULE | Freq: Every day | RESPIRATORY_TRACT | Status: DC
  Filled 2014-06-15: qty 5

## 2014-06-15 MED ORDER — ALBUTEROL SULFATE (2.5 MG/3ML) 0.083% IN NEBU
3.0000 mL | INHALATION_SOLUTION | RESPIRATORY_TRACT | Status: DC | PRN
  Administered 2014-06-15: 3 mL via RESPIRATORY_TRACT
  Filled 2014-06-15: qty 3

## 2014-06-15 MED ORDER — SODIUM CHLORIDE 0.9 % IV SOLN
INTRAVENOUS | Status: DC
  Administered 2014-06-15: 02:00:00 via INTRAVENOUS

## 2014-06-15 MED ORDER — PANTOPRAZOLE SODIUM 40 MG IV SOLR
40.0000 mg | Freq: Every day | INTRAVENOUS | Status: DC
  Filled 2014-06-15: qty 40

## 2014-06-15 MED ORDER — OXYCODONE-ACETAMINOPHEN 5-325 MG PO TABS
1.0000 | ORAL_TABLET | ORAL | Status: DC | PRN
  Administered 2014-06-15 (×2): 1 via ORAL
  Administered 2014-06-15 – 2014-06-21 (×20): 2 via ORAL
  Filled 2014-06-15 (×17): qty 2
  Filled 2014-06-15: qty 1
  Filled 2014-06-15 (×4): qty 2

## 2014-06-15 NOTE — Progress Notes (Signed)
Pt transferred from the ED around midnight, admitted to Rm/3S11. Pt comes from home with daughter. She is alert and oriented. Ambulates with cane. Abrasions to BUE and Left knee, foam dressing applied. Placed on telemetry, currently NSR. Oriented to room, instructed to call for assistance before getting out of bed. Resting comfortably at this time, will continue to monitor
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
## 2014-06-15 NOTE — Progress Notes (Signed)
Chaplain responded to spiritual care consult for advance directive assistance. Offered support to pt and patient's daughter, Emily Harrington. Patient said she is in a lot of pain; Emily Harrington said she is exhausted. Emily Harrington is next of kin and patient wants her to be HPOA. Chaplain provided advance directive documents for their review at their convenience. They are aware that chaplains are available for consultation and assist with completing of documents. Please page if requested.   Maurene Capes [phone]

## 2014-06-16 MED ORDER — AMITRIPTYLINE HCL 100 MG PO TABS
100.0000 mg | ORAL_TABLET | Freq: Every day | ORAL | Status: DC
  Administered 2014-06-16 – 2014-06-19 (×2): 100 mg via ORAL
  Filled 2014-06-16 (×7): qty 1

## 2014-06-16 MED ORDER — MOMETASONE FURO-FORMOTEROL FUM 100-5 MCG/ACT IN AERO
2.0000 | INHALATION_SPRAY | Freq: Two times a day (BID) | RESPIRATORY_TRACT | Status: DC
  Administered 2014-06-16 – 2014-06-21 (×9): 2 via RESPIRATORY_TRACT
  Filled 2014-06-16 (×3): qty 8.8

## 2014-06-16 MED ORDER — POTASSIUM CITRATE ER 10 MEQ (1080 MG) PO TBCR
10.0000 meq | EXTENDED_RELEASE_TABLET | Freq: Two times a day (BID) | ORAL | Status: DC
  Administered 2014-06-16 – 2014-06-21 (×10): 10 meq via ORAL
  Filled 2014-06-16 (×11): qty 1

## 2014-06-16 MED ORDER — DIAZEPAM 5 MG PO TABS: 5.0000 mg | ORAL_TABLET | Freq: Four times a day (QID) | ORAL | Status: DC | PRN

## 2014-06-16 MED ORDER — VITAMIN D 1000 UNITS PO TABS
5000.0000 [IU] | ORAL_TABLET | Freq: Every day | ORAL | Status: DC
  Administered 2014-06-17 – 2014-06-21 (×5): 5000 [IU] via ORAL
  Filled 2014-06-16 (×6): qty 5

## 2014-06-16 MED ORDER — PENTAZOCINE-NALOXONE HCL 50-0.5 MG PO TABS
2.0000 | ORAL_TABLET | Freq: Two times a day (BID) | ORAL | Status: DC
  Administered 2014-06-16 – 2014-06-17 (×2): 2 via ORAL
  Administered 2014-06-17: 1 via ORAL
  Administered 2014-06-19 – 2014-06-21 (×4): 2 via ORAL
  Filled 2014-06-16 (×9): qty 2

## 2014-06-16 MED ORDER — SODIUM CHLORIDE 0.9 % IJ SOLN: 3.0000 mL | INTRAMUSCULAR | Status: DC | PRN

## 2014-06-16 MED ORDER — ENOXAPARIN SODIUM 40 MG/0.4ML ~~LOC~~ SOLN
40.0000 mg | SUBCUTANEOUS | Status: DC
  Administered 2014-06-17 – 2014-06-21 (×5): 40 mg via SUBCUTANEOUS
  Filled 2014-06-16 (×5): qty 0.4

## 2014-06-16 NOTE — Progress Notes (Signed)
PT Cancellation Note  Patient Details Name: Emily Harrington MRN: [id number] DOB: April 08, 1951   Cancelled Treatment:    Reason Eval/Treat Not Completed: Other (comment) (spoke with patient, patient currently with increased pain at this time, and in process of transfer to new unit, will attempt to follow up when pre-medicated as tolerated.)   Fabio Asa 06/16/2014, 4:00 PM Charlotte Crumb, PT DPT  [phone]
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
## 2014-06-16 NOTE — Progress Notes (Signed)
Report called to [place], RN on 6N. Pt's VSS, pain addressed and medication given, all due meds given, personal belongings with pt. Central telemetry notified of transfer to [facility] and pt removed from monitor. Pt transferred via bed to 6N03.

## 2014-06-16 NOTE — Progress Notes (Signed)
Received call from RN that pt's daughter, Ileana Ladd, was asking questions about pt's HH needs.  I confirmed that cell phone number on face sheet was correct. Called just now as RN said pt was at work.  The voicemail said mailbox was full and I was unable to leave a message. Pt not ready for d/c today. Called RN back and advised her to give pt's daughter my number [phone]) if she calls back.

## 2014-06-16 NOTE — Progress Notes (Signed)
Attempted to call report to [address] North.

## 2014-06-17 DIAGNOSIS — D62 Acute posthemorrhagic anemia: Secondary | ICD-10-CM | POA: Diagnosis not present

## 2014-06-17 DIAGNOSIS — S32009A Unspecified fracture of unspecified lumbar vertebra, initial encounter for closed fracture: Secondary | ICD-10-CM | POA: Diagnosis present

## 2014-06-17 LAB — BASIC METABOLIC PANEL
Anion gap: 11 (ref 5–15)
BUN: 13 mg/dL (ref 6–23)
CHLORIDE: 104 meq/L (ref 96–112)
CO2: 23 meq/L (ref 19–32)
Calcium: 9 mg/dL (ref 8.4–10.5)
Creatinine, Ser: 1.13 mg/dL — ABNORMAL HIGH (ref 0.50–1.10)
GFR calc Af Amer: 58 mL/min — ABNORMAL LOW (ref >90)
GFR calc non Af Amer: 50 mL/min — ABNORMAL LOW (ref >90)
Glucose, Bld: 132 mg/dL — ABNORMAL HIGH (ref 70–99)
POTASSIUM: 4.9 meq/L (ref 3.7–5.3)
Sodium: 138 mEq/L (ref 137–147)

## 2014-06-17 MED ORDER — POLYETHYLENE GLYCOL 3350 17 G PO PACK
17.0000 g | PACK | Freq: Every day | ORAL | Status: DC
  Administered 2014-06-17 – 2014-06-21 (×5): 17 g via ORAL
  Filled 2014-06-17 (×6): qty 1

## 2014-06-17 MED ORDER — TRAMADOL HCL 50 MG PO TABS
100.0000 mg | ORAL_TABLET | Freq: Four times a day (QID) | ORAL | Status: DC
  Administered 2014-06-17 – 2014-06-21 (×12): 100 mg via ORAL
  Filled 2014-06-17 (×12): qty 2

## 2014-06-17 MED ORDER — DOCUSATE SODIUM 100 MG PO CAPS
100.0000 mg | ORAL_CAPSULE | Freq: Two times a day (BID) | ORAL | Status: DC
  Administered 2014-06-17 – 2014-06-21 (×9): 100 mg via ORAL
  Filled 2014-06-17 (×9): qty 1
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
## 2014-06-17 NOTE — Progress Notes (Signed)
Rehab Admissions Coordinator Note:  Patient was screened by Emily Harrington for appropriateness for an Inpatient Acute Rehab Consult. Per OT recommendation.  At this time, we are recommending Inpatient Rehab consult to assess if CIR vs SNF most appropriate. Noted pt refusing SNF but pt does not have 24/7 assist at home.  Emily Harrington 06/17/2014, 3:08 PM  I can be reached at [phone].

## 2014-06-17 NOTE — Progress Notes (Signed)
Patient seen and examined.  Agree with PA's note.  

## 2014-06-18 LAB — CBC
HEMATOCRIT: 29.2 % — AB (ref 36.0–46.0)
HEMOGLOBIN: 9.5 g/dL — AB (ref 12.0–15.0)
MCH: 29.2 pg (ref 26.0–34.0)
MCHC: 32.5 g/dL (ref 30.0–36.0)
MCV: 89.8 fL (ref 78.0–100.0)
Platelets: 223 10*3/uL (ref 150–400)
RBC: 3.25 MIL/uL — ABNORMAL LOW (ref 3.87–5.11)
RDW: 14.4 % (ref 11.5–15.5)
WBC: 9.5 10*3/uL (ref 4.0–10.5)
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
## 2014-06-19 MED ORDER — MAGNESIUM CITRATE PO SOLN
1.0000 | Freq: Once | ORAL | Status: AC
  Administered 2014-06-19: 1 via ORAL
  Filled 2014-06-19: qty 296

## 2014-06-19 NOTE — Progress Notes (Signed)
OT Cancellation Note  Patient Details Name: Emily Harrington MRN: [id number] DOB: 1951-03-25   Cancelled Treatment:    Reason Eval/Treat Not Completed: Pain limiting ability to participate  Earlie Raveling OTR/L [phone] 06/19/2014, 4:04 PM
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
## 2014-06-19 NOTE — Progress Notes (Signed)
I have seen and examined the pt and agree with PA-Jeffery's progress note.  

## 2014-06-20 ENCOUNTER — Encounter (HOSPITAL_COMMUNITY): Payer: Self-pay | Admitting: Physical Medicine and Rehabilitation

## 2014-06-20 DIAGNOSIS — S2239XA Fracture of one rib, unspecified side, initial encounter for closed fracture: Secondary | ICD-10-CM

## 2014-06-20 DIAGNOSIS — S32009A Unspecified fracture of unspecified lumbar vertebra, initial encounter for closed fracture: Secondary | ICD-10-CM

## 2014-06-20 MED ORDER — FLEET ENEMA 7-19 GM/118ML RE ENEM
1.0000 | ENEMA | Freq: Once | RECTAL | Status: AC
  Administered 2014-06-20: 12:00:00 via RECTAL
  Filled 2014-06-20: qty 1
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
## 2014-06-20 NOTE — Progress Notes (Signed)
Not much appetite, working with IS. Patient examined and I agree with the assessment and plan  Violeta Gelinas, MD, MPH, FACS Trauma: [phone] General Surgery: [phone]  06/20/2014 9:08 AM
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
## 2014-06-20 NOTE — Progress Notes (Signed)
Rehab admissions - Evaluated for possible admission.  I met with patient.  She would like inpatient rehab admission prior to home with daughter.  Awaiting OT evaluation.  I will open the case to begin preauthorization for possible acute inpatient rehab admission.  I will follow up daily.  Call me for questions.  #[phone]

## 2014-06-21 ENCOUNTER — Inpatient Hospital Stay (HOSPITAL_COMMUNITY)
Admission: RE | Admit: 2014-06-21 | Discharge: 2014-07-01 | DRG: 946 | Disposition: A | Discharge status: Home Health | Payer: No Typology Code available for payment source | Source: Intra-hospital | Attending: Physical Medicine & Rehabilitation | Admitting: Physical Medicine & Rehabilitation

## 2014-06-21 DIAGNOSIS — Z7982 Long term (current) use of aspirin: Secondary | ICD-10-CM

## 2014-06-21 DIAGNOSIS — J4489 Other specified chronic obstructive pulmonary disease: Secondary | ICD-10-CM

## 2014-06-21 DIAGNOSIS — S2249XA Multiple fractures of ribs, unspecified side, initial encounter for closed fracture: Secondary | ICD-10-CM

## 2014-06-21 DIAGNOSIS — S32009A Unspecified fracture of unspecified lumbar vertebra, initial encounter for closed fracture: Secondary | ICD-10-CM

## 2014-06-21 DIAGNOSIS — Z79899 Other long term (current) drug therapy: Secondary | ICD-10-CM

## 2014-06-21 DIAGNOSIS — Z5189 Encounter for other specified aftercare: Secondary | ICD-10-CM | POA: Diagnosis present

## 2014-06-21 DIAGNOSIS — J449 Chronic obstructive pulmonary disease, unspecified: Secondary | ICD-10-CM | POA: Diagnosis not present

## 2014-06-21 DIAGNOSIS — F411 Generalized anxiety disorder: Secondary | ICD-10-CM | POA: Diagnosis not present

## 2014-06-21 DIAGNOSIS — I10 Essential (primary) hypertension: Secondary | ICD-10-CM | POA: Diagnosis not present

## 2014-06-21 DIAGNOSIS — Y92410 Unspecified street and highway as the place of occurrence of the external cause: Secondary | ICD-10-CM

## 2014-06-21 DIAGNOSIS — I252 Old myocardial infarction: Secondary | ICD-10-CM | POA: Diagnosis not present

## 2014-06-21 DIAGNOSIS — Z8674 Personal history of sudden cardiac arrest: Secondary | ICD-10-CM | POA: Diagnosis not present

## 2014-06-21 DIAGNOSIS — S20219A Contusion of unspecified front wall of thorax, initial encounter: Secondary | ICD-10-CM

## 2014-06-21 DIAGNOSIS — S301XXA Contusion of abdominal wall, initial encounter: Secondary | ICD-10-CM | POA: Diagnosis not present

## 2014-06-21 DIAGNOSIS — R338 Other retention of urine: Secondary | ICD-10-CM | POA: Diagnosis present

## 2014-06-21 DIAGNOSIS — T1490XA Injury, unspecified, initial encounter: Secondary | ICD-10-CM | POA: Diagnosis present

## 2014-06-21 DIAGNOSIS — W19XXXA Unspecified fall, initial encounter: Secondary | ICD-10-CM

## 2014-06-21 DIAGNOSIS — R339 Retention of urine, unspecified: Secondary | ICD-10-CM

## 2014-06-21 LAB — URINALYSIS, ROUTINE W REFLEX MICROSCOPIC
BILIRUBIN URINE: NEGATIVE
Glucose, UA: NEGATIVE mg/dL
KETONES UR: NEGATIVE mg/dL
Nitrite: POSITIVE — AB
PROTEIN: NEGATIVE mg/dL
SPECIFIC GRAVITY, URINE: 1.012 (ref 1.005–1.030)
UROBILINOGEN UA: 0.2 mg/dL (ref 0.0–1.0)
pH: 7.5 (ref 5.0–8.0)

## 2014-06-21 LAB — URINE MICROSCOPIC-ADD ON

## 2014-06-21 MED ORDER — AMITRIPTYLINE HCL 100 MG PO TABS
100.0000 mg | ORAL_TABLET | Freq: Every day | ORAL | Status: DC
  Administered 2014-06-22 – 2014-06-30 (×9): 100 mg via ORAL
  Filled 2014-06-21 (×12): qty 1

## 2014-06-21 MED ORDER — ONDANSETRON HCL 4 MG PO TABS: 4.0000 mg | ORAL_TABLET | Freq: Four times a day (QID) | ORAL | Status: DC | PRN

## 2014-06-21 MED ORDER — POTASSIUM CITRATE ER 10 MEQ (1080 MG) PO TBCR
10.0000 meq | EXTENDED_RELEASE_TABLET | Freq: Two times a day (BID) | ORAL | Status: DC
  Administered 2014-06-21 – 2014-07-01 (×20): 10 meq via ORAL
  Filled 2014-06-21 (×23): qty 1

## 2014-06-21 MED ORDER — MOMETASONE FURO-FORMOTEROL FUM 100-5 MCG/ACT IN AERO
2.0000 | INHALATION_SPRAY | Freq: Two times a day (BID) | RESPIRATORY_TRACT | Status: DC
  Administered 2014-06-21 – 2014-07-01 (×18): 2 via RESPIRATORY_TRACT
  Filled 2014-06-21: qty 8.8

## 2014-06-21 MED ORDER — PANTOPRAZOLE SODIUM 40 MG PO TBEC
40.0000 mg | DELAYED_RELEASE_TABLET | Freq: Every day | ORAL | Status: DC
  Administered 2014-06-22 – 2014-07-01 (×10): 40 mg via ORAL
  Filled 2014-06-21 (×10): qty 1

## 2014-06-21 MED ORDER — ACETAMINOPHEN 325 MG PO TABS
325.0000 mg | ORAL_TABLET | ORAL | Status: DC | PRN
Start: 2014-06-21 — End: 2014-07-01
  Filled 2014-06-21: qty 2

## 2014-06-21 MED ORDER — GUAIFENESIN-DM 100-10 MG/5ML PO SYRP: 5.0000 mL | ORAL_SOLUTION | Freq: Four times a day (QID) | ORAL | Status: DC | PRN

## 2014-06-21 MED ORDER — METHOCARBAMOL 500 MG PO TABS
750.0000 mg | ORAL_TABLET | Freq: Three times a day (TID) | ORAL | Status: DC | PRN
  Administered 2014-06-22 – 2014-06-28 (×5): 750 mg via ORAL
  Filled 2014-06-21 (×5): qty 2

## 2014-06-21 MED ORDER — BOOST / RESOURCE BREEZE PO LIQD
1.0000 | Freq: Three times a day (TID) | ORAL | Status: DC
  Administered 2014-06-21 – 2014-07-01 (×20): 1 via ORAL

## 2014-06-21 MED ORDER — ENOXAPARIN SODIUM 30 MG/0.3ML ~~LOC~~ SOLN
30.0000 mg | SUBCUTANEOUS | Status: DC
  Administered 2014-06-21 – 2014-06-30 (×10): 30 mg via SUBCUTANEOUS
  Filled 2014-06-21 (×11): qty 0.3

## 2014-06-21 MED ORDER — TRAMADOL HCL 50 MG PO TABS
50.0000 mg | ORAL_TABLET | Freq: Four times a day (QID) | ORAL | Status: DC
  Administered 2014-06-22 – 2014-07-01 (×32): 50 mg via ORAL
  Filled 2014-06-21 (×33): qty 1

## 2014-06-21 MED ORDER — ALBUTEROL SULFATE (2.5 MG/3ML) 0.083% IN NEBU: 3.0000 mL | INHALATION_SOLUTION | RESPIRATORY_TRACT | Status: DC | PRN

## 2014-06-21 MED ORDER — ONDANSETRON HCL 4 MG/2ML IJ SOLN: 4.0000 mg | Freq: Four times a day (QID) | INTRAMUSCULAR | Status: DC | PRN

## 2014-06-21 MED ORDER — PENTAZOCINE-NALOXONE HCL 50-0.5 MG PO TABS
2.0000 | ORAL_TABLET | Freq: Two times a day (BID) | ORAL | Status: DC
  Administered 2014-06-22 – 2014-07-01 (×20): 2 via ORAL
  Filled 2014-06-21 (×20): qty 2

## 2014-06-21 MED ORDER — VITAMIN D3 25 MCG (1000 UNIT) PO TABS
5000.0000 [IU] | ORAL_TABLET | Freq: Every day | ORAL | Status: DC
  Administered 2014-06-22 – 2014-07-01 (×10): 5000 [IU] via ORAL
  Filled 2014-06-21 (×11): qty 5

## 2014-06-21 MED ORDER — POLYETHYLENE GLYCOL 3350 17 G PO PACK
17.0000 g | PACK | Freq: Every day | ORAL | Status: DC
  Administered 2014-06-22 – 2014-07-01 (×9): 17 g via ORAL
  Filled 2014-06-21 (×12): qty 1

## 2014-06-21 MED ORDER — DIAZEPAM 5 MG PO TABS
5.0000 mg | ORAL_TABLET | Freq: Four times a day (QID) | ORAL | Status: DC | PRN
  Administered 2014-06-22: 5 mg via ORAL
  Filled 2014-06-21 (×2): qty 1

## 2014-06-21 MED ORDER — BUDESONIDE-FORMOTEROL FUMARATE 160-4.5 MCG/ACT IN AERO: 2.0000 | INHALATION_SPRAY | Freq: Two times a day (BID) | RESPIRATORY_TRACT | Status: DC

## 2014-06-21 MED ORDER — SENNOSIDES-DOCUSATE SODIUM 8.6-50 MG PO TABS
1.0000 | ORAL_TABLET | Freq: Every day | ORAL | Status: DC
Start: 2014-06-21 — End: 2014-07-01
  Administered 2014-06-21 – 2014-06-27 (×7): 1 via ORAL
  Filled 2014-06-21 (×7): qty 1

## 2014-06-21 MED ORDER — ALUM & MAG HYDROXIDE-SIMETH 200-200-20 MG/5ML PO SUSP: 30.0000 mL | ORAL | Status: DC | PRN

## 2014-06-21 MED ORDER — FLEET ENEMA 7-19 GM/118ML RE ENEM: 1.0000 | ENEMA | Freq: Once | RECTAL | Status: AC | PRN

## 2014-06-21 MED ORDER — BISACODYL 10 MG RE SUPP: 10.0000 mg | Freq: Every day | RECTAL | Status: DC | PRN

## 2014-06-21 MED ORDER — OXYCODONE-ACETAMINOPHEN 5-325 MG PO TABS
1.0000 | ORAL_TABLET | ORAL | Status: DC | PRN
  Administered 2014-06-21 – 2014-07-01 (×42): 2 via ORAL
  Filled 2014-06-21 (×4): qty 2
  Filled 2014-06-21: qty 1
  Filled 2014-06-21 (×33): qty 2
  Filled 2014-06-21: qty 1
  Filled 2014-06-21 (×6): qty 2
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
## 2014-06-21 NOTE — Progress Notes (Signed)
Patient transferred to inpatient rehab as ordered, report was given to nurse Doree Fudge.
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
## 2014-06-21 NOTE — Progress Notes (Signed)
Rehab admissions - I called and opened the case with [employer] at 3:45 pm yesterday.  I faxed clinicals to [facility] and I am waiting for a response from insurance case manager regarding possible acute inpatient rehab admission.  Call me for questions.  #[phone]
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
## 2014-06-21 NOTE — Progress Notes (Signed)
Rehab admissions - I have authorization for acute inpatient rehab admission for today.  Patient and her daughter in agreement.  Bed available and will admit to acute inpatient rehab today.  Call me for questions.  #[phone]

## 2014-06-21 NOTE — Discharge Summary (Signed)
Tamyia Minich, MD, MPH, FACS Trauma: [phone] General Surgery: [phone]  
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
## 2014-06-21 NOTE — Progress Notes (Signed)
Hope for CIR. Patient examined and I agree with the assessment and plan  Violeta Gelinas, MD, MPH, FACS Trauma: [phone] General Surgery: [phone]  06/21/2014 9:13 AM
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
## 2014-06-22 ENCOUNTER — Encounter (HOSPITAL_COMMUNITY): Payer: Self-pay | Admitting: *Deleted

## 2014-06-22 ENCOUNTER — Inpatient Hospital Stay (HOSPITAL_COMMUNITY): Payer: Self-pay | Admitting: Occupational Therapy

## 2014-06-22 ENCOUNTER — Inpatient Hospital Stay (HOSPITAL_COMMUNITY): Payer: No Typology Code available for payment source

## 2014-06-22 ENCOUNTER — Inpatient Hospital Stay (HOSPITAL_COMMUNITY): Payer: Self-pay | Admitting: Physical Therapy

## 2014-06-22 ENCOUNTER — Inpatient Hospital Stay (HOSPITAL_COMMUNITY): Payer: Self-pay | Admitting: Speech Pathology

## 2014-06-22 DIAGNOSIS — S32009A Unspecified fracture of unspecified lumbar vertebra, initial encounter for closed fracture: Secondary | ICD-10-CM

## 2014-06-22 LAB — CBC WITH DIFFERENTIAL/PLATELET
Basophils Absolute: 0 10*3/uL (ref 0.0–0.1)
Basophils Relative: 1 % (ref 0–1)
EOS ABS: 0.7 10*3/uL (ref 0.0–0.7)
Eosinophils Relative: 9 % — ABNORMAL HIGH (ref 0–5)
HCT: 31.4 % — ABNORMAL LOW (ref 36.0–46.0)
Hemoglobin: 9.8 g/dL — ABNORMAL LOW (ref 12.0–15.0)
LYMPHS PCT: 21 % (ref 12–46)
Lymphs Abs: 1.7 10*3/uL (ref 0.7–4.0)
MCH: 29.4 pg (ref 26.0–34.0)
MCHC: 31.2 g/dL (ref 30.0–36.0)
MCV: 94.3 fL (ref 78.0–100.0)
MONOS PCT: 7 % (ref 3–12)
Monocytes Absolute: 0.6 10*3/uL (ref 0.1–1.0)
Neutro Abs: 5 10*3/uL (ref 1.7–7.7)
Neutrophils Relative %: 62 % (ref 43–77)
Platelets: 329 10*3/uL (ref 150–400)
RBC: 3.33 MIL/uL — ABNORMAL LOW (ref 3.87–5.11)
RDW: 14.9 % (ref 11.5–15.5)
WBC: 8 10*3/uL (ref 4.0–10.5)

## 2014-06-22 LAB — COMPREHENSIVE METABOLIC PANEL
ALT: 35 U/L (ref 0–35)
ANION GAP: 10 (ref 5–15)
AST: 33 U/L (ref 0–37)
Albumin: 2.6 g/dL — ABNORMAL LOW (ref 3.5–5.2)
Alkaline Phosphatase: 162 U/L — ABNORMAL HIGH (ref 39–117)
BILIRUBIN TOTAL: 0.4 mg/dL (ref 0.3–1.2)
BUN: 15 mg/dL (ref 6–23)
CO2: 29 meq/L (ref 19–32)
Calcium: 10.1 mg/dL (ref 8.4–10.5)
Chloride: 102 mEq/L (ref 96–112)
Creatinine, Ser: 1.04 mg/dL (ref 0.50–1.10)
GFR, EST AFRICAN AMERICAN: 64 mL/min — AB (ref >90)
GFR, EST NON AFRICAN AMERICAN: 56 mL/min — AB (ref >90)
Glucose, Bld: 90 mg/dL (ref 70–99)
POTASSIUM: 4.5 meq/L (ref 3.7–5.3)
Sodium: 141 mEq/L (ref 137–147)
TOTAL PROTEIN: 6.4 g/dL (ref 6.0–8.3)

## 2014-06-22 MED ORDER — CEPHALEXIN 250 MG PO CAPS
250.0000 mg | ORAL_CAPSULE | Freq: Three times a day (TID) | ORAL | Status: DC
  Administered 2014-06-22 – 2014-06-27 (×16): 250 mg via ORAL
  Filled 2014-06-22 (×19): qty 1
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
## 2014-06-22 NOTE — Care Management Note (Signed)
Inpatient Rehabilitation Center Individual Statement of Services  Patient Name:  Emily Harrington  Date:  06/22/2014  Welcome to the Inpatient Rehabilitation Center.  Our goal is to provide you with an individualized program based on your diagnosis and situation, designed to meet your specific needs.  With this comprehensive rehabilitation program, you will be expected to participate in at least 3 hours of rehabilitation therapies Monday-Friday, with modified therapy programming on the weekends.  Your rehabilitation program will include the following services:  Physical Therapy (PT), Occupational Therapy (OT), Speech Therapy (ST), 24 hour per day rehabilitation nursing, Neuropsychology, Case Management (Social Worker), Rehabilitation Medicine, Nutrition Services and Pharmacy Services  Weekly team conferences will be held on Wednesday to discuss your progress.  Your Social Worker will talk with you frequently to get your input and to update you on team discussions.  Team conferences with you and your family in attendance may also be held.  Expected length of stay: 7 days  Overall anticipated outcome: mod/i w/c level and supervision ambulation  Depending on your progress and recovery, your program may change. Your Social Worker will coordinate services and will keep you informed of any changes. Your Social Worker's name and contact numbers are listed  below.  The following services may also be recommended but are not provided by the Inpatient Rehabilitation Center:   Driving Evaluations  Home Health Rehabiltiation Services  Outpatient Rehabilitation Services    Arrangements will be made to provide these services after discharge if needed.  Arrangements include referral to agencies that provide these services.  Your insurance has been verified to be:  Tedd Sias Your primary doctor is:  Benito Mccreedy  Pertinent information will be shared with your doctor and your insurance  company.  Social Worker:  Dossie Der, SW [phone] or [phone]  Information discussed with and copy given to patient by: [name], 06/22/2014, 3:25 PM
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
## 2014-06-22 NOTE — Progress Notes (Signed)
Patient information reviewed and entered into eRehab system by Tuyet Bader, RN, CRRN, PPS Coordinator.  Information including medical coding and functional independence measure will be reviewed and updated through discharge.    
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
## 2014-06-23 ENCOUNTER — Encounter (HOSPITAL_COMMUNITY): Payer: Self-pay | Admitting: Occupational Therapy

## 2014-06-23 ENCOUNTER — Inpatient Hospital Stay (HOSPITAL_COMMUNITY): Payer: Self-pay | Admitting: Speech Pathology

## 2014-06-23 ENCOUNTER — Inpatient Hospital Stay (HOSPITAL_COMMUNITY): Payer: No Typology Code available for payment source | Admitting: *Deleted

## 2014-06-23 ENCOUNTER — Inpatient Hospital Stay (HOSPITAL_COMMUNITY): Payer: Self-pay | Admitting: Physical Therapy

## 2014-06-23 DIAGNOSIS — S2249XA Multiple fractures of ribs, unspecified side, initial encounter for closed fracture: Secondary | ICD-10-CM

## 2014-06-23 DIAGNOSIS — S32009A Unspecified fracture of unspecified lumbar vertebra, initial encounter for closed fracture: Secondary | ICD-10-CM
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
## 2014-06-23 NOTE — Progress Notes (Signed)
Occupational Therapy Session Note  Patient Details  Name: Emily Harrington MRN: [id number] Date of Birth: 08-25-1951  Today's Date: 06/23/2014 OT Individual Time: 6440-3474 OT Individual Time Calculation (min): 60 min    Short Term Goals: Week 1:  OT Short Term Goal 1 (Week 1): STG = LTGs due to ELOS  Skilled Therapeutic Interventions/Progress Updates:    Engaged in ADL retraining at sit > stand level with focus on education on AE to increase independence with self-care tasks of bathing and dressing as well as recall of new information.  Pt in bed upon arrival requesting physical assist to lift pt OOB, educated on rolling to increase participation in supine to sit.  Wound RN present to assess skin, reporting no wounds on buttocks.  Bathing and dressing completed at sink with pt requiring increased time and min cues to remember what she was doing after interruption by MD.  Rosine Abe on use of sock aid and long handled shoe horn to assist with LB dressing as pt reports difficulty reaching feet due to pain.  Pt easily distracted and forgetful while completing dressing task this session, unable to recall education on how to use sock aid moments after education and demonstration.   Therapy Documentation Precautions:  Precautions Precautions: Fall Restrictions Weight Bearing Restrictions: No General:   Vital Signs: Therapy Vitals Temp: 98 F (36.7 C) Temp src: Oral Pulse Rate: 98 Resp: 18 BP: 121/52 mmHg Patient Position (if appropriate): Lying Oxygen Therapy SpO2: 93 % O2 Device: None (Room air) Pain: Pain Assessment Pain Assessment: 0-10 Pain Score: 8  Pain Type: Acute pain Pain Location: Back Pain Orientation: Mid Pain Descriptors / Indicators: Aching;Sore Pain Frequency: Intermittent Pain Onset: With Activity Patients Stated Pain Goal: 2 Pain Intervention(s): RN notified ADL: ADL ADL Comments: See FIM  See FIM for current functional status  Therapy/Group:  Individual Therapy  Rosalio Loud 06/23/2014, 9:39 AM

## 2014-06-23 NOTE — Progress Notes (Signed)
Physical Therapy Session Note  Patient Details  Name: Emily Harrington MRN: [id number] Date of Birth: [birth year]  Today's Date: 06/23/2014 PT Individual Time: 7564-3329 PT Individual Time Calculation (min): 60 min   Short Term Goals: Week 1:  PT Short Term Goal 1 (Week 1): STG's=LTG's secondary to anticipate LOS.  Skilled Therapeutic Interventions/Progress Updates:   Pt received semi reclined in bed; agreeable to therapy. Pt performed supine>sit with min A, with HOB flat, no rails, significant increase in pain in ribs,and excessive thoracolumbar rotation. Instructed pt in logroll technique with supine>sit. Pt then performed supine>sit x2 trials, initially with mod A, 50% cueing for logroll technique, and no increase in pain; final supine>sit with min A and 25% cueing. W/c mobility x130' in controlled environment with bilat UE's requiring supervision, cueing for technique with turning.   In treatment gym, pt negotiated 3 steps with 2 rails, forward-facing with step-to pattern and supervision. Transitioned to negotiating 2 steps without rails (to simulate home entrance) with single HHA to ascend. Upon reaching top of 3 stairs, pt reports feeling "weak" and "faint"; therefore, pt descended stairs with bilat rails and min guard for safety. After seated rest break, returned to pt room, per pt request. Upon return to room, pt requesting to recheck temperature, which was 98.6 degrees. Vitals WNL. Per pt report of feeling better, pt performed gait x40' in controlled environment with rolling walker and min guard; noted decreased LLE stance time, decreased RLE step length. Session ended in pt room, where pt was left semi reclined in bed with 3 rails up, bed alarm on, and all needs within reach.  Therapy Documentation Precautions:  Precautions Precautions: Fall Restrictions Weight Bearing Restrictions: No Pain: Pain Assessment Pain Assessment: 0-10 Pain Score: 7  Pain Type: Acute pain Pain  Location: Generalized Pain Orientation: Left Pain Descriptors / Indicators: Aching Pain Frequency: Intermittent Pain Onset: On-going Patients Stated Pain Goal: 2 Pain Intervention(s): Repositioned;Ambulation/increased activity Multiple Pain Sites: No  See FIM for current functional status  Therapy/Group: Individual Therapy  Erika Hussar, [name] 06/23/2014, 8:28 PM

## 2014-06-23 NOTE — Progress Notes (Signed)
Speech Language Pathology Daily Session Note  Patient Details  Name: Emily Harrington MRN: [id number] Date of Birth: 1951-04-19  Today's Date: 06/23/2014 SLP Individual Time: 0902-1002 SLP Individual Time Calculation (min): 60 min  Short Term Goals: Week 1: SLP Short Term Goal 1 (Week 1): Pt will utliize external aids to improve recall of daily information for 80% accuracy with supervision  SLP Short Term Goal 2 (Week 1): Pt will improve semi-complex problem solving for 80% accuracy with supervision  SLP Short Term Goal 3 (Week 1): Pt will identify 2 cognitive impairments and their impact on her functional independence in her home environment with supervision question cues.    Skilled Therapeutic Interventions:  Pt was seen for skilled speech therapy targeting cognitive goals.  Upon arrival, pt was upright in wheelchair, awake, alert, and agreeable to participate in ST.  SLP facilitated the session with a structured medication management task with the use of written aids to facilitate recall of new information.  Pt benefited from supervision question cues to identify function of medication when named for 100% accuracy.  Additionally, pt loaded pills of varying dosages and frequencies into a pill box with min assist instructional cues for planning, thought organization, and error awareness.  Continue per current plan of care.      FIM:  Comprehension Comprehension Mode: Auditory Comprehension: 5-Follows basic conversation/direction: With extra time/assistive device Expression Expression Mode: Verbal Expression: 5-Expresses basic needs/ideas: With extra time/assistive device Social Interaction Social Interaction: 5-Interacts appropriately 90% of the time - Needs monitoring or encouragement for participation or interaction. Problem Solving Problem Solving: 4-Solves basic 75 - 89% of the time/requires cueing 10 - 24% of the time Memory Memory: 4-Recognizes or recalls 75 - 89% of the  time/requires cueing 10 - 24% of the time FIM - Eating Eating Activity: 7: Complete independence:no helper  Pain Pain Assessment Pain Assessment: No/denies pain   Therapy/Group: Individual Therapy  Jackalyn Lombard, M.A. CCC-SLP   Lyal Husted, Melanee Spry 06/23/2014, 4:47 PM

## 2014-06-24 ENCOUNTER — Encounter (HOSPITAL_COMMUNITY): Payer: Self-pay | Admitting: Occupational Therapy

## 2014-06-24 ENCOUNTER — Inpatient Hospital Stay (HOSPITAL_COMMUNITY): Payer: Self-pay | Admitting: Physical Therapy

## 2014-06-24 MED ORDER — DIAZEPAM 5 MG PO TABS
5.0000 mg | ORAL_TABLET | Freq: Four times a day (QID) | ORAL | Status: DC | PRN
  Administered 2014-06-24 – 2014-06-28 (×2): 5 mg via ORAL
  Filled 2014-06-24 (×2): qty 1

## 2014-06-24 NOTE — Progress Notes (Signed)
Occupational Therapy Session Note  Patient Details  Name: Emily Harrington MRN: [id number] Date of Birth: 07/01/1951  Today's Date: 06/24/2014 OT Individual Time: 0930-1030 OT Individual Time Calculation (min): 60 min    Short Term Goals: Week 1:  OT Short Term Goal 1 (Week 1): STG = LTGs due to ELOS  Skilled Therapeutic Interventions/Progress Updates:  Pt in supine position in bed upon arrival.  Question cues for recall of education on bed mobility, required min verbal cues for sequencing log rolling technique to increase functional bed mobility from supine > sit at EOB.  Vitals 119/41 at EOB, however pt reports no signs of dizziness or fatigue.  Pt ambulated from EOB to shower chair using RW at supervision level to complete showering task.  Retrained Pt on doffing socks while seated in shower using reacher as pt reports pain with reaching for feet.  Educated and provided Pt with long-handled sponge to increase reaching capability and decrease pain level during trunk and hip flexion movements when washing BLE.  Supervision level with sit <> stand using RW for toilet transfers to [facility] over toilet as pt reports having raised toilet seat at home.  Completed dressing tasks seated at EOB, short breaks needed due to decreased activity tolerance and SOB.  Pt demonstrated recall of proper use of sock aid to don socks, encouraged pt to use step stool under feet to increase independence with donning shoes. RN present with Pt upon exiting room.     Therapy Documentation Precautions:  Precautions Precautions: Fall Restrictions Weight Bearing Restrictions: No General:   Vital Signs: Therapy Vitals Temp: 98.8 F (37.1 C) Temp src: Oral Pulse Rate: 104 Resp: 18 BP: 141/70 mmHg Patient Position (if appropriate): Lying Oxygen Therapy SpO2: 98 % O2 Device: None (Room air) Pain: Pain Assessment Pain Assessment: 0-10 Pain Score: 6  Pain Type: Acute pain Pain Location: Rib cage Pain  Orientation: Left Pain Descriptors / Indicators: Aching Pain Frequency: Intermittent Pain Onset: On-going Patients Stated Pain Goal: 4 Pain Intervention(s): Medication (See eMAR) Multiple Pain Sites: No ADL: ADL ADL Comments: See FIM  See FIM for current functional status  Therapy/Group: Individual Therapy  Miking Usrey 06/24/2014, 3:34 PM

## 2014-06-24 NOTE — Progress Notes (Addendum)
Physical Therapy Session Note  Patient Details  Name: Emily Harrington MRN: [id number] Date of Birth: 1951-10-16  Today's Date: 06/24/2014 PT Individual Time: 1300-1400 and 1500-1600 PT Individual Time Calculation (min): 60 min and 60 min  Short Term Goals: Week 1:  PT Short Term Goal 1 (Week 1): STG's=LTG's secondary to anticipate LOS.  Skilled Therapeutic Interventions/Progress Updates:    Treatment Session 1: Pt received semi reclined in bed; agreeable to therapy. Ambulated 2x10' to/from bathroom in controlled environment with rolling walker and supervision to min guard, mod verbal cueing to maintain rolling walker in contact with ground with ineffective within-session carryover. In ortho gym, pt performed stand pivot transfer from w/c<>simulated car with rolling walker and supervision. Negotiated 3 steps without rails using SBQC, forward-facing with step-to pattern and mod, L HHA. In rehab apartment, pt performed supine<>sit (HOB flat, no rails) with verbal cueing for logroll technique to avoid thoracolumbar rotation, pain. Furniture transfer (sit<>stand from couch) with rolling walker and supervision, cueing for setup, technique. Session ended in pt room, where pt was left semi reclined in bed with bed alarm on, 3 rails up, all needs within reach.  Treatment Session 2: Pt received semi reclined in bed; agreeable to therapy. Session focused on bed mobility, functional ambulation. Pt performed gait x141', x120' in controlled environment with rolling walker and supervision to min guard (during final 5' secondary to decreased control of walker advancement). In ortho gym, pt performed graded sit<>stand transfers from progressively lower mat table height with multimodal cueing focused on utilizing LE's (to avoid dependence on UE's for transfers, which appears to increase discomfort in pt's ribcage). Pt inquisitive as to reason for location of pain. Explained location of fractured ribs and lumbar  spine transverse processes. Utilized illustration of anatomy to reinforce. Pt verbalized understanding. Pt performed multiple supine<>sit transfers on mat table with mod verbal cueing to reinforce logroll technique. Pt gave effective return demonstration of during final supine<>sit. In supine (hook lying), educated pt on activation of transverse abdominis activation for core stabilization during mobility to decrease pain; pt with effective activation. Session ended in pt room, where pt was left semi reclined in bed with 3 rails up, bed alarm on, and all needs within reach.  Addendum: The following long term goals were upgraded secondary to pt progressing more quickly than anticipated: dynamic standing balance, gait in controlled/home environments, car/furniture transfers. LTG added for community gait. Downgraded goals addressing w/c mobility in all environments.  Therapy Documentation Precautions:  Precautions Precautions: Fall Restrictions Weight Bearing Restrictions: No Vital Signs: Therapy Vitals Pulse Rate: 103 BP: 119/41 mmHg Patient Position (if appropriate): Sitting Oxygen Therapy SpO2: 97 % O2 Device: None (Room air) Pain: Pain Assessment Pain Assessment: 0-10 Pain Score: 6  Pain Type: Acute pain Pain Location: Back Pain Orientation: Left Pain Descriptors / Indicators: Aching Pain Frequency: Intermittent Pain Onset: On-going Patients Stated Pain Goal: 4 Pain Intervention(s): Ambulation/increased activity Multiple Pain Sites: No  See FIM for current functional status  Therapy/Group: Individual Therapy  Hobble, [name] 06/24/2014, 1:16 PM
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
## 2014-06-24 NOTE — Progress Notes (Signed)
I have reviewed and agree with the attached treatment note.  Elfie Costanza, OTR/L 
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
## 2014-06-25 ENCOUNTER — Inpatient Hospital Stay (HOSPITAL_COMMUNITY): Payer: No Typology Code available for payment source | Admitting: *Deleted

## 2014-06-25 ENCOUNTER — Encounter (HOSPITAL_COMMUNITY): Payer: Self-pay | Admitting: Occupational Therapy

## 2014-06-25 ENCOUNTER — Inpatient Hospital Stay (HOSPITAL_COMMUNITY): Payer: No Typology Code available for payment source

## 2014-06-25 NOTE — Progress Notes (Signed)
Occupational Therapy Session Note  Patient Details  Name: Emily Harrington MRN: [id number] Date of Birth: [birth year]  Today's Date: 06/25/2014  Short Term Goals: Week 1:  OT Short Term Goal 1 (Week 1): STG = LTGs due to ELOS  Skilled Therapeutic Interventions/Progress Updates:   Session #1 1400-1430 - 30 Minutes Double/Group with one other patient Patient with 6/10 complaints of pain around ribs, RN aware Rehab tech assisted patient > therapy gym for group therapy. Once in gym, patient performed stand pivot transfer > NuStep machine for focus on BLE strengthening & overall endurance. Patient engaged in NuStep exercise for ~12 minutes of level 3 with multiple rest breaks; patient reported "somewhat hard" for exertion. Patient transferred back to w/c and this group session carried over into individual treatment session, see below.     Session #2 1430-1500 - 30 Minutes Individual Therapy Patient continued to have 6/10 complaints of pain around ribs, RN aware 1:1 OT focusing skilled intervention on BUE strengthening, w/c mobility, overall activity tolerance/endurance, pain management. Patient worked on Dispensing optician throughout gift shop and therapist started educating patient on a BUE HEP for functional strengthening and as a pain management technique. Therapist assisted patient back to room and left patient seated in w/c with all needs within reach, patient aware to call nurses station with any needs and when wanting to get back to bed.   Precautions:  Precautions Precautions: Fall Restrictions Weight Bearing Restrictions: No  See FIM for current functional status  Zaniel Marineau,Torria 06/25/2014, 7:58 AM

## 2014-06-25 NOTE — Progress Notes (Signed)
Occupational Therapy Session Note  Patient Details  Name: Emily Harrington MRN: [id number] Date of Birth: 04-Aug-1951  Today's Date: 06/25/2014 OT Individual Time:  - 0930-1015   (45 min)  1st session:    Short Term Goals: Week 1:  OT Short Term Goal 1 (Week 1): STG = LTGs due to ELOS Week 2:     Skilled Therapeutic Interventions/Progress Updates:    1st session:  Addressed bed mobility, sit to stand, functional mobility, AE usage for bathing, energy conservation techniques.  Pt. Went from supine to sit with SBA.  Sit to stand=  SBA plus cues for breath control.  Pt. Ambulated over to sink and performed bathing and dressing.  Went over energy conservation strategies with breathing and ways to increase diaphragm strength.  Pt returned demonstration but needs reinforcement.  Addressed breath control and holding at end of exhalation.  Pt returned to be after session with SBA to sup ine position.    Time:  1500-1600  (60 group)  2nd session Pain::  8/10   chest Individual session 2nd session Engaged in functional mobility in ADL apartment. Utilized mobility at walker Pt. Went from sit to stand with min assist and minimal assist for static standing balance at [employer].Prepared snack at kitchen table for better ergonometric position.Pt prepared snack food with no problems. Practiced dynamic stepping balance with heel toe pattern.   Utilized teach back approach for pt to verbalized her strategies for effective movements.  Transferred>< wc with SBA with stand pivot transfer technique. Pt. Taken to room and left with all needs in place.     Therapy Documentation Precautions:  Precautions Precautions: Fall Restrictions Weight Bearing Restrictions: No    Pain: Pain Assessment Pain Assessment: 0-10 Pain Score: 6  1st session Pain Type: Acute pain Pain Location: Rib cage Pain Orientation: Right Pain Frequency: Intermittent Pain Onset: Gradual Pain Intervention(s): Medication (See  eMAR)  ADL ADL Comments: See FIM        See FIM for current functional status  Therapy/Group: Individual Therapy  Humberto Seals 06/25/2014, 10:04 AM
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
## 2014-06-26 ENCOUNTER — Inpatient Hospital Stay (HOSPITAL_COMMUNITY): Payer: No Typology Code available for payment source | Admitting: Occupational Therapy

## 2014-06-26 DIAGNOSIS — S32009A Unspecified fracture of unspecified lumbar vertebra, initial encounter for closed fracture: Secondary | ICD-10-CM

## 2014-06-26 LAB — URINE CULTURE
Colony Count: NO GROWTH
Culture: NO GROWTH

## 2014-06-26 NOTE — Progress Notes (Signed)
Occupational Therapy Session Note  Patient Details  Name: Emily Harrington MRN: [id number] Date of Birth: January 30, 1951  Today's Date: 06/26/2014 OT Group Time: 1405-1505 OT Group Time Calculation (min): 60 min  Short Term Goals: Week 1:  OT Short Term Goal 1 (Week 1): STG = LTGs due to ELOS  Skilled Therapeutic Interventions/Progress Updates:  Engaged in Therapeutic Activities Group to address, sit><stands, dynamic standing balance, standing tolerance and adaptive techniques.  Patient attempted to pick up items from the floor at a w/c level however she reports too painful by the time she is able to touch the item yet not able to pick it up.  Reviewed adaptive methods for completing this task to include reacher or ask others to do this for her until she is experiencing less pain.  Patient requested to be pushed in w/c to and from group session due to rib pain yet she was able to participate in the session.  Therapy Documentation Precautions:  Precautions Precautions: Fall Restrictions Weight Bearing Restrictions: No Pain: 6/10 hip and ribs, premedicated, rest and repositioned ADL:  Therapy/Group: Group Therapy  Jrue Yambao 06/26/2014, 3:39 PM
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
## 2014-06-27 ENCOUNTER — Inpatient Hospital Stay (HOSPITAL_COMMUNITY): Payer: Self-pay | Admitting: Rehabilitation

## 2014-06-27 ENCOUNTER — Inpatient Hospital Stay (HOSPITAL_COMMUNITY): Payer: Self-pay

## 2014-06-27 ENCOUNTER — Encounter (HOSPITAL_COMMUNITY): Payer: Self-pay | Admitting: Occupational Therapy

## 2014-06-27 ENCOUNTER — Inpatient Hospital Stay (HOSPITAL_COMMUNITY): Payer: Self-pay | Admitting: Occupational Therapy
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
## 2014-06-27 NOTE — Progress Notes (Signed)
Occupational Therapy Session Note  Patient Details  Name: Emily Harrington MRN: [id number] Date of Birth: 1951/04/15  Today's Date: 06/27/2014 OT Individual Time: 4098-1191 OT Individual Time Calculation (min): 46 min    Short Term Goals: Week 1:  OT Short Term Goal 1 (Week 1): STG = LTGs due to ELOS  Skilled Therapeutic Interventions/Progress Updates:  1)  Pt reported pain 8 out of 10 upon arrival while lying supine in bed.  Focused on appropriate bed mobility from supine > sitting EOB.  Ambulated from EOB > toilet (bedside commode positioned over toilet) using RW to engage in toileting task at supervision level.  Completed bathing task seated in w/c in front of sink, followed by dressing task seated at EOB.  Retrained Pt on use of sock-aid device to don socks on to decrease pain due to hip and trunk flexion during task.  Engaged in donning shoes using stool positioned under Pt's feet while suggesting to carry over use of foot stool when at home to assist with LB dressing, helping to decrease pain.  Pt reported decrease of pain level (7 out of 10) at end of session.     Therapy Documentation Precautions:  Precautions Precautions: Fall Restrictions Weight Bearing Restrictions: No General:   Vital Signs: Oxygen Therapy SpO2: 97 % O2 Device: None (Room air) Pain: Pain Assessment Pain Assessment: 0-10 Pain Score: 7  Pain Location: Rib cage Pain Orientation: Right;Left Pain Descriptors / Indicators: Aching Pain Onset: On-going Patients Stated Pain Goal: 5 Pain Intervention(s):  (Pt pre-medicated prior to session) ADL: ADL ADL Comments: See FIM  See FIM for current functional status  Therapy/Group: Individual Therapy  Mardi Cannady 06/27/2014, 12:18 PM

## 2014-06-27 NOTE — Progress Notes (Signed)
Physical Therapy Session Note  Patient Details  Name: Emily Harrington MRN: [id number] Date of Birth: [birth year]  Today's Date: 06/27/2014 PT Individual Time: 1030-1050 PT Individual Time Calculation (min): 20 min   Short Term Goals: Week 1:  PT Short Term Goal 1 (Week 1): STG's=LTG's secondary to anticipate LOS.  Skilled Therapeutic Interventions/Progress Updates:    Pt received supine in bed, reporting significantly increased back and rib pain. Pt refused OOB therapy but agreeable to bed level exercises. Pt performed hook lying glute set/bridging (pt unable to clear bottom from bed), SLR, supine hip abd/add, SL clams, heel slides all x10 ea. Pt unable to lay on R side to perform LLE clams due to increased pain. Pt requested that session end early due to pain. Pt left supine in bed w/ all needs within reach.  Therapy Documentation Precautions:  Precautions Precautions: Fall Restrictions Weight Bearing Restrictions: No General:   Vital Signs: Therapy Vitals Temp: 98.3 F (36.8 C) Temp src: Oral Pulse Rate: 100 Resp: 16 BP: 132/48 mmHg Patient Position (if appropriate): Lying Oxygen Therapy SpO2: 95 % O2 Device: None (Room air) Pain: Pain Assessment Pain Assessment: 0-10 Pain Score: 8  Pain Type: Chronic pain Pain Location: Back Pain Orientation: Lower Pain Descriptors / Indicators: Aching Pain Frequency: Intermittent Pain Onset: On-going Pain Intervention(s): Medication (See eMAR) Multiple Pain Sites: No Mobility:   Locomotion :    Trunk/Postural Assessment :    Balance:   Exercises:   Other Treatments:    See FIM for current functional status  Therapy/Group: Individual Therapy  Hosie Spangle Hosie Spangle, PT, DPT 06/27/2014, 7:49 AM
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
## 2014-06-27 NOTE — Progress Notes (Signed)
Occupational Therapy Note  Patient Details  Name: Emily Harrington MRN: [id number] Date of Birth: 01-19-1951  Today's Date: 06/27/2014  Pt missed 45 mins skilled OT treatment session due to pain.  Pt reports pain in back and ribs (exacerbated from weekend and AM sessions) and unwilling to participate in OOB activities.  Pt reports having just received pain meds, followed up 15 mins later to which pt still reports unable to participate at this time. Will follow up as able.  Rosalio Loud 06/27/2014, 3:34 PM

## 2014-06-27 NOTE — Progress Notes (Signed)
Physical Therapy Session Note  Patient Details  Name: Emily Harrington MRN: [id number] Date of Birth: 1951/09/24  Today's Date: 06/27/2014 PT Individual Time: 1300-1400 PT Individual Time Calculation (min): 60 min   Short Term Goals: Week 1:  PT Short Term Goal 1 (Week 1): STG's=LTG's secondary to anticipate LOS.  Skilled Therapeutic Interventions/Progress Updates:   Pt received lying in bed, agreeable to session, however stating increased pain in chest and ribs today.  Pt wanting to use restroom, therefore performed bed mobility at S level with HOB elevated and with rails due to increased pain.  Ambulated to/from restroom at close S level and stood at sink to wash/dry hands with S.  Min cues for maintaining position inside of RW when at sink and using restroom.  Ambulated x 50' outside of room in controlled environment at close S level.  Pt with increased pain in low back, therefore provided chair and pt propelled remainder of distance to therapy gym.  Worked on gait intermittently during session as pain allowed in controlled and ADL apt to simulate home at min/guard to S level with cues as mentioned above.  Performed seated nustep x 8 mins at level 2 resistance with LEs only to increase overall strength and endurance.  Pt tolerated well, however with very slow pace due to pain.  Noted some language of confusion during our session, as she would mention previous PT session, but sentence did not make sense.  Performed bed mobility in ADL apt at S level with cues for pillow hug to reduce strain on ribs/chest.  Pt return demonstration well.  While in bed, performed SLR x 10 reps and knee flex x 10 reps.  Ambulated to ortho gym to perform car transfer at close S level with min cues for technique.  Pt self propelled w/c with BLEs back to room.  RN notified of pain med request. Performed seated LAQs x 10 reps then transferred to bed at S level with RW.  Left in bed with all needs in reach and bed alarm set.     Therapy Documentation Precautions:  Precautions Precautions: Fall Restrictions Weight Bearing Restrictions: No   Vital Signs: Oxygen Therapy SpO2: 97 % O2 Device: None (Room air) Pain: Pain Assessment Pain Assessment: 0-10 Pain Score: 7  Pain Location: Rib cage Pain Orientation: Right;Left Pain Descriptors / Indicators: Aching Pain Onset: On-going Patients Stated Pain Goal: 5 Pain Intervention(s):  (Pt pre-medicated prior to session)     See FIM for current functional status  Therapy/Group: Individual Therapy  Vista Deck 06/27/2014, 1:15 PM

## 2014-06-27 NOTE — Progress Notes (Signed)
I have reviewed and agree with the attached treatment note.  Brihana Quickel, OTR/L 

## 2014-06-28 ENCOUNTER — Inpatient Hospital Stay (HOSPITAL_COMMUNITY): Payer: Self-pay | Admitting: Physical Therapy

## 2014-06-28 ENCOUNTER — Encounter (HOSPITAL_COMMUNITY): Payer: Self-pay | Admitting: Occupational Therapy

## 2014-06-28 ENCOUNTER — Inpatient Hospital Stay (HOSPITAL_COMMUNITY): Payer: Self-pay | Admitting: Speech Pathology

## 2014-06-28 DIAGNOSIS — S2249XA Multiple fractures of ribs, unspecified side, initial encounter for closed fracture: Secondary | ICD-10-CM

## 2014-06-28 DIAGNOSIS — S32009A Unspecified fracture of unspecified lumbar vertebra, initial encounter for closed fracture: Secondary | ICD-10-CM

## 2014-06-28 MED ORDER — LIDOCAINE HCL 2 % EX GEL
CUTANEOUS | Status: DC | PRN
  Filled 2014-06-28: qty 5

## 2014-06-28 NOTE — Progress Notes (Signed)
I have reviewed and agree with the attached treatment note.  Gracieann Stannard, OTR/L 

## 2014-06-28 NOTE — Progress Notes (Signed)
Speech Language Pathology Daily Session Note  Patient Details  Name: Emily Harrington MRN: [id number] Date of Birth: [birth year]  Today's Date: 06/28/2014 SLP Individual Time: 0801-0902 SLP Individual Time Calculation (min): 61 min  Short Term Goals: Week 1: SLP Short Term Goal 1 (Week 1): Pt will utliize external aids to improve recall of daily information for 80% accuracy with supervision  SLP Short Term Goal 2 (Week 1): Pt will improve semi-complex problem solving for 80% accuracy with supervision  SLP Short Term Goal 3 (Week 1): Pt will identify 2 cognitive impairments and their impact on her functional independence in her home environment with supervision question cues.    Skilled Therapeutic Interventions:  Pt was seen for skilled speech therapy targeting cognitive goals.  Pt was partially reclined in bed upon arrival and requested to complete therapies at bedside secondary to not having completed bathing and dressing yet this AM.  Pt remained awake and pleasantly interactive throughout therapy session.  SLP facilitated session with a structured money management task targeting counting money, making change, and numerical reasoning for basic word problems with pt requiring overall min assist verbal cues for thought organization and error awareness for ~80% accuracy.  SLP also facilitated the session with a structured scheduling task targeting semi-complex planning and organization with pt requiring overall min-mod assist verbal cues to complete task for >75% accuracy in the presence of moderate environmental distractions (i.e. TV on, intermittent visitors).   Continue per current plan of care.    FIM:  Comprehension Comprehension Mode: Auditory Comprehension: 5-Follows basic conversation/direction: With extra time/assistive device Expression Expression Mode: Verbal Expression: 5-Expresses basic needs/ideas: With extra time/assistive device Social Interaction Social Interaction:  5-Interacts appropriately 90% of the time - Needs monitoring or encouragement for participation or interaction. Problem Solving Problem Solving: 4-Solves basic 75 - 89% of the time/requires cueing 10 - 24% of the time Memory Memory: 4-Recognizes or recalls 75 - 89% of the time/requires cueing 10 - 24% of the time  Pain Pain Assessment Pain Assessment: No/denies pain   Therapy/Group: Individual Therapy  Jackalyn Lombard, M.A. CCC-SLP  Norvil Martensen, Melanee Spry 06/28/2014, 10:18 AM
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
## 2014-06-28 NOTE — Progress Notes (Signed)
Social Work Patient ID: Emily Harrington, female   DOB: [birth year], 63 y.o.   MRN: [id number] Spoke with insurance case manage who has authorized pt until Friday 9/11.  Aware of progress and need for more therapies.

## 2014-06-28 NOTE — Progress Notes (Signed)
Occupational Therapy Weekly Progress Note  Patient Details  Name: Emily Harrington MRN: [id number] Date of Birth: [birth year]  Beginning of progress report period: June 22, 2014 End of progress report period: June 28, 2014  Today's Date: 06/28/2014 OT Individual Time: 4098-1191 OT Individual Time Calculation (min): 60 min    Short term goals not set due to estimated length of stay.  Pt making progress towards goals, however due to pain in ribs and back pt has limited participation in treatment sessions since the weekend.  Pt currently requires close supervision with all mobility and self-care tasks due to decreased recall of new information.    Patient continues to demonstrate the following deficits: generalized weakness, pain, decreased recall of new information and therefore will continue to benefit from skilled OT intervention to enhance overall performance with BADL, iADL and Reduce care partner burden.  See Patient's Care Plan for progression toward long term goals.  Patient progressing toward long term goals..  Plan of care revisions: downgraded to supervision overall due to decreased recall and carryover of new information.  Skilled Therapeutic Interventions/Progress Updates:    Pt displayed appropriate bed mobility from supine > sitting at EOB, followed by using RW to ambulate to w/c (located at room sink) to complete bathing and grooming tasks while seated.  Pt retrieved clothing using RW and required verbal cues to gather towels and washcloths, as well as other toiletry needs, prior to bathing task.  Therapist discussed energy conservation strategies regarding bathing upon D/C within Pt's home (lives with daughter) and suggested completing bathing tasks in the morning since Pt reports she has more energy at this time.  Pt replied stating she would wait until her daughter returned home in the evenings to do all bathing needs due to her "daughter doing everything for her" prior  to hospital stay.  Encouraged Pt to take sponge baths positioned at sink if daughter was unable to meet Pt's needs and discussed the importance of taking short rest breaks as needed during ADL tasks.    Reiterated and suggested benefits of using AE devices (sock aid, reacher, and long handled shoe horn) to assist with LB dressing due to limited trunk and hip flexion as well as pain in ribs.  Pt reports she owns all of the above equipment at home but would prefer not to use AE devices, however pt utilizing step stool to donn socks and shoes.  Engaged in functional transfers using RW <> shower tub bench in ADL apartment to simulate Pt's home environment to increase safety.  Pt requested to return to hospital room bed at end of session due to fatigue.  Therapy Documentation Precautions:  Precautions Precautions: Fall Restrictions Weight Bearing Restrictions: No General:   Vital Signs: Therapy Vitals Temp: 98.3 F (36.8 C) Temp src: Oral Pulse Rate: 94 Resp: 16 BP: 112/29 mmHg Patient Position (if appropriate): Lying Oxygen Therapy SpO2: 99 % O2 Device: None (Room air) Pain: Pain Assessment Pain Assessment: 0-10 Pain Score: 8  Pain Type: Acute pain Pain Location: Back Pain Orientation: Left Pain Descriptors / Indicators: Aching Pain Onset: On-going Pain Intervention(s): Medication (See eMAR) ADL: ADL ADL Comments: See FIM  See FIM for current functional status  Therapy/Group: Individual Therapy  Kaleya Douse 06/28/2014, 4:01 PM

## 2014-06-28 NOTE — Progress Notes (Signed)
Social Work Patient ID: Emily Harrington, female   DOB: 1951/02/26, 63 y.o.   MRN: [id number] Spoke with team who feels pt will not be ready until Friday to go home.  Checking with MD regarding changing discharge date. Spoke with daughter who is taking next week off to be with Mom and can come in Friday at 10;00 for family education.  Pt is having pain from the Weekend therapies and all she did with her trunk.  Will work with team and pt and daughter on discharge plans.

## 2014-06-28 NOTE — Progress Notes (Signed)
Social Work Patient ID: Emily Harrington, female   DOB: [birth year], 63 y.o.   MRN: [id number] Spoke with daughter via telephone to discuss pt's being ready for discharge Friday and recommendation is supervision/mod/i level. Daughter reports: " This will make it hard on me, but I guess I will work it out."  She plans to take off next week to be with Mom. She will get back with worker regarding coming in for family education.  Daughter did not want to be bothered.  Will await her return call.

## 2014-06-28 NOTE — Progress Notes (Addendum)
Physical Therapy Weekly Progress Note  Patient Details  Name: Emily Harrington MRN: [id number] Date of Birth: 1950-11-20  Beginning of progress report period: June 22, 2014 End of progress report period: June 28, 2014  Today's Date: 06/28/2014 PT Individual Time: 1300-1400 PT Individual Time Calculation (min): 60 min   Patient has met 5 of 14 long term goals.  Short term goals not set due to estimated length of stay.    Patient continues to demonstrate the following deficits: muscle weakness, decreased attention and decreased memory and decreased sitting balance, decreased standing balance and decreased balance strategies and therefore will continue to benefit from skilled PT intervention to enhance overall performance with activity tolerance, balance.  See Patient's Care Plan for progression toward long term goals.  Patient not progressing toward long term goals.  See goal revision.  Plan of care revisions: Long term goals downgraded to supervision overall secondary to slow pt progress.  Skilled Therapeutic Interventions/Progress Updates:    Pt received semi reclined in bed; agreeable to therapy. Session focused on assessing/addressing safety/independence with functional mobility and modifying goals accordingly. Performed gait x150' in controlled environment with rolling walker and supervision to min guard secondary to staggering gait pattern and L toe catches x3. When asked if dizzy, pt reported "spinning" dizziness after performing supine>sit (to L side of bed) just prior to functional ambulation trial. L Dix-Hallpike Test reveals L upbeating torsional nystagmus x8 seconds with 3-second latency accompanied by concordant "spinning" sensation. 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 Attempted to perform L Canalith Repositioning Maneuver; however, pt unable to tolerate maneuver secondary to pain in ribcage with rolling. Will continue to assess. After symptoms dissipated,  pt performed w/c mobility x175' in controlled environment with bilat UE's and increased time. Negotiated 3 stairs without rails (to simulate home entrance) forward-facing with step-to pattern using SBQC requiring min A, L HHA and min cueing for technique. Session ended in pt room, where pt was left semi reclined in bed with 3 rails up, bed alarm on, and all needs within reach.   Therapy Documentation Precautions:  Precautions Precautions: Fall Restrictions Weight Bearing Restrictions: No Vital Signs: Therapy Vitals Temp: 98.3 F (36.8 C) Temp src: Oral Pulse Rate: 94 Resp: 16 BP: 112/29 mmHg Patient Position (if appropriate): Lying Oxygen Therapy SpO2: 99 % O2 Device: None (Room air) Pain: Pain Assessment Pain Assessment: 0-10 Pain Score: 8  Pain Type: Acute pain Pain Location: Back Pain Orientation: Left Pain Descriptors / Indicators: Aching Pain Onset: On-going Pain Intervention(s): Medication (See eMAR) Locomotion : Ambulation Ambulation/Gait Assistance: 5: Supervision;4: Min guard Wheelchair Mobility Distance: 175   See FIM for current functional status  Therapy/Group: Individual Therapy  Hobble, Malva Cogan 06/28/2014, 4:55 PM

## 2014-06-29 ENCOUNTER — Inpatient Hospital Stay (HOSPITAL_COMMUNITY): Payer: Self-pay | Admitting: Physical Therapy

## 2014-06-29 ENCOUNTER — Inpatient Hospital Stay (HOSPITAL_COMMUNITY): Payer: Self-pay | Admitting: Occupational Therapy

## 2014-06-29 ENCOUNTER — Encounter (HOSPITAL_COMMUNITY): Payer: Self-pay | Admitting: Occupational Therapy

## 2014-06-29 NOTE — Progress Notes (Signed)
Social Work Patient ID: Emily Harrington, female   DOB: [birth year], 63 y.o.   MRN: [id number] Met with pt and spoke with daughter yesterday to inform team conference progression toward goals and discharge 9/11. Daughter to be in Friday at 10;00 to go through family education and then take home.  Will make follow up arrangements, pt already Has DME from previous health issues.

## 2014-06-29 NOTE — Progress Notes (Signed)
I have reviewed and agree with the attached treatment note.  Hoke Baer, OTR/L 
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
## 2014-06-29 NOTE — Progress Notes (Signed)
Physical Therapy Session Note  Patient Details  Name: Emily Harrington MRN: [id number] Date of Birth: December 04, 1950  Today's Date: 06/29/2014 PT Individual Time: 2952-8413 and [phone] PT Individual Time Calculation (min): 54 min and 50 min  Short Term Goals: Week 1:  PT Short Term Goal 1 (Week 2): STG's=LTG's secondary to anticipated LOS.  Skilled Therapeutic Interventions/Progress Updates:    Treatment Session 1: Pt received semi reclined in bed; agreeable to therapy. Session focused on car transfers,Pt performed gait x210' in controlled environment with rolling walker and supervision, cueing to slow down after turning to L side secondary to slight LOB with effective self-recovery. L Head Thrust Test (performed during seated rest break) suggestive of L-sided vestibular hypofunction. Explained, demonstrated X1 viewing exercise with horizontal head turning to address impaired VOR; paper handout provided to facilitate carryover. Pt required cueing for effective return demonstration of exercise. Pt performed stand pivot transfer from w/c<>simulated car x2 trials with rolling walker and supervision, cueing to scoot posteriorly in seat prior to moving LE's into simulated car. Pt requesting to end therapy session at this time secondary to fatigue, discomfort in ribcage. With max encouragement, pt agreeable to completing session after seated rest break. Explained, demonstrated negotiation of standard curb with rolling walker. Pt then negotiated standard curb x2 trials with min A, manual stabilization of rolling walker, and 50% cueing for technique. Pt forward-facing to ascend during both trials; backward to descend for initial trial, forward-facing to descend during final trial. Returned to room via w/c mobility x215' in controlled environment with bilat LE's and supervision, increased time.  Treatment Session 2: Pt received semi reclined in bed; agreeable to therapy. Session focused on community mobility,  high level ambulation. Pt performed w/c mobility >250' in controlled, home, and community environments with bilat LE's and supervision. Pt performed gait x175', x150' in community environment (inside/outside hospital lobby) with rolling walker and supervision. Pt negotiated standard ramp with rolling walker and supervision; negotiated standard curb step with rolling walker and min guard, manual stabilization of rolling walker and effective between-session carryover of technique. Returned to rehab unit, where pt performed sit<>stand from couch with rolling walker and supervision. In kitchen, pt searched for kitchen items while concurrently sidestepping using rolling walker x4 minutes total; verbal cueing to avoid trunk rotation to avoid pain in ribcage, spine. Session ended in pt room, where pt was left semi reclined in bed with 3 bed rails up, bed alarm on, and all needs within reach. No c/o dizziness during this session.  Per pt report, primary entrance of home has 2 steps (no rails) followed by short landing, then 1 small step (no rails). Long term goal for stairs modified to simulate home environment. Downgraded long term goal addressing bed mobility to supervision secondary to pt consistently requiring cueing for use of logroll technique to decrease pain in ribcage, spine.  Therapy Documentation Precautions:  Precautionsly Precautions: Fall Restrictions Weight Bearing Restrictions: No Pain: Pain Assessment Pain Assessment: 0-10 Pain Score: 7  Pain Type: Acute pain Pain Location: Back Pain Orientation: Right;Left;Mid Pain Radiating Towards: arms Pain Descriptors / Indicators: Aching Pain Onset: On-going Pain Intervention(s): Repositioned;Rest Multiple Pain Sites: No Locomotion : Ambulation Ambulation/Gait Assistance: 5: Supervision;4: Min guard Wheelchair Mobility Distance: 215   See FIM for current functional status  Therapy/Group: Individual Therapy  Hobble, [name] 06/29/2014,  12:36 PM

## 2014-06-29 NOTE — Progress Notes (Signed)
Report given to on coming RN Kem Kays who will resume care. Pt is stable will monitor. Estill Dooms, RN 12:32 AM 06/29/2014

## 2014-06-29 NOTE — Progress Notes (Signed)
Occupational Therapy Session Note  Patient Details  Name: Emily Harrington MRN: [id number] Date of Birth: 03-26-1951  Today's Date: 06/29/2014 OT Individual Time:  0830- 0915 and [id number] Total Time:  45 min and 31 min      Short Term Goals: Week 1:  OT Short Term Goal 1 (Week 1): STG = LTGs due to ELOS OT Short Term Goal 1 - Progress (Week 1): Progressing toward goal  Skilled Therapeutic Interventions/Progress Updates:  1)  Pt reported feeling much better this morning with a good night of sleep upon arrival.  Demonstrated appropriate bed mobility from supine > sitting at EOB.  Ambulated to bathroom shower chair using RW to doff clothing and complete bathing task without use of any AE (long handled sponge), standing to wash buttocks at supervision level.  Engaged in dressing task at sink while utilizing foot stool to assist with LB dressing.  Encouraged Pt to use weighted foot stool when return home to continue task productivity.  During LB dressing task, Pt displayed increased [name] with hip and trunk flexion while seated in w/c.  Pt reported feeling less overall pain today and remained alert and demonstrated safety awareness throughout entire session.  Educated on proper positioning of body with raised HOB to improve breathing quality upon returning to bed at end of session.           2)  Engaged in toileting task using RW <> raised toilet seat with supervision.  Pt ambulated using RW from room to ADL apartment to engage in laundry task (retrieved clothes from laundry basket on table top and folded) while standing, simulating home environment.  Pt required short rest breaks during task.  Suggested placing a fold-up chair (due to decreased hallway space) near washer/dryer at home to increase safety awareness and focus on energy conservation while engaged in standing tasks upon D/C.  Engaged in transfers from RW <> sofa and RW <> chair without arms to simulate furniture within Pt's home  environment, focusing on safety awareness with sit <> stand, as well as incorporating various furniture surfaces when Pt is at home alone (Pt reports daugther works during day).        Therapy Documentation Precautions:  Precautions Precautions: Fall Restrictions Weight Bearing Restrictions: No    Vital Signs:   Pain: Pain Assessment Pain Assessment: 0-10 Pain Score: 7  Pain Type: Acute pain Pain Location: Back Pain Orientation: Right;Left;Mid Pain Radiating Towards: arms Pain Descriptors / Indicators: Aching Pain Onset: On-going Pain Intervention(s): Repositioned;Rest Multiple Pain Sites: No ADL: ADL ADL Comments: See FIM  See FIM for current functional status  Therapy/Group: Individual Therapy  Eryck Negron 06/29/2014, 10:08 AM

## 2014-06-30 ENCOUNTER — Inpatient Hospital Stay (HOSPITAL_COMMUNITY): Payer: No Typology Code available for payment source | Admitting: Occupational Therapy

## 2014-06-30 ENCOUNTER — Inpatient Hospital Stay (HOSPITAL_COMMUNITY): Payer: No Typology Code available for payment source | Admitting: Physical Therapy

## 2014-06-30 ENCOUNTER — Inpatient Hospital Stay (HOSPITAL_COMMUNITY): Payer: Self-pay | Admitting: Speech Pathology

## 2014-06-30 DIAGNOSIS — W19XXXA Unspecified fall, initial encounter: Secondary | ICD-10-CM

## 2014-06-30 DIAGNOSIS — S32009A Unspecified fracture of unspecified lumbar vertebra, initial encounter for closed fracture: Secondary | ICD-10-CM

## 2014-06-30 MED ORDER — PANTOPRAZOLE SODIUM 40 MG PO TBEC: 40.0000 mg | DELAYED_RELEASE_TABLET | Freq: Every day | ORAL | Status: DC

## 2014-06-30 MED ORDER — TRAMADOL HCL 50 MG PO TABS: 50.0000 mg | ORAL_TABLET | Freq: Three times a day (TID) | ORAL | Status: DC

## 2014-06-30 MED ORDER — POLYETHYLENE GLYCOL 3350 17 G PO PACK: 17.0000 g | PACK | Freq: Two times a day (BID) | ORAL | Status: DC

## 2014-06-30 MED ORDER — OXYCODONE-ACETAMINOPHEN 10-325 MG PO TABS: 0.5000 | ORAL_TABLET | Freq: Four times a day (QID) | ORAL | Status: DC | PRN
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
## 2014-06-30 NOTE — Progress Notes (Signed)
Speech Language Pathology Daily Session Note  Patient Details  Name: Emily Harrington MRN: [id number] Date of Birth: 1951-10-06  Today's Date: 06/30/2014 SLP Individual Time: 4696-2952 SLP Individual Time Calculation (min): 60 min  Short Term Goals: Week 1: SLP Short Term Goal 1 (Week 1): Pt will utliize external aids to improve recall of daily information for 80% accuracy with supervision  SLP Short Term Goal 2 (Week 1): Pt will improve semi-complex problem solving for 80% accuracy with supervision  SLP Short Term Goal 3 (Week 1): Pt will identify 2 cognitive impairments and their impact on her functional independence in her home environment with supervision question cues.    Skilled Therapeutic Interventions:  The pt was seen for skilled speech therapy targeting cognitive goals.  Upon arrival, pt was reclined in bed, awake, alert, and agreeable to participate in ST.  Pt recalled route to ADL kitchen from previous OT session with modified independence.  Once in the ADL kitchen, SLP facilitated the session with a basic cooking task targeting planning, thought organization, time management, and safety awareness.  Pt required intermittent supervision level verbal cues for foot placement with walker to maximize safety while ambulating in the kitchen.  Additionally, pt was 100% accurate for planning and organization to complete cooking task with initial supervision instructional cues and used external aids for time management for 100% accuracy with modified independence.  Once returned to room, pt required min instructional cues to identify targeted goals of activity.  Will plan to see pt tomorrow for family education prior to discharge.    FIM:  Comprehension Comprehension Mode: Auditory Comprehension: 6-Follows complex conversation/direction: With extra time/assistive device Expression Expression Mode: Verbal Expression: 6-Expresses complex ideas: With extra time/assistive device Social  Interaction Social Interaction: 5-Interacts appropriately 90% of the time - Needs monitoring or encouragement for participation or interaction. Problem Solving Problem Solving: 5-Solves basic problems: With no assist Memory Memory: 5-Recognizes or recalls 90% of the time/requires cueing < 10% of the time  Pain Pain Assessment Pain Assessment: No/denies pain  Therapy/Group: Individual Therapy  Jackalyn Lombard, M.A. CCC-SLP  Child Campoy, Melanee Spry 06/30/2014, 5:55 PM

## 2014-06-30 NOTE — Progress Notes (Addendum)
Physical Therapy Discharge Summary  Patient Details  Name: Addilyne Backs MRN: [id number] Date of Birth: 30-Oct-1950  Today's Date: 07/01/2014   Patient has met 14 of 14 long term goals due to improved activity tolerance, improved balance, increased strength and ability to compensate for deficits.  Patient to discharge at an ambulatory level Supervision.   Patient's care partner is independent to provide the necessary physical and cognitive assistance at discharge.  Reasons goals not met: NA  Recommendation:  Patient will benefit from ongoing skilled PT services in home health setting to continue to advance safe functional mobility, address ongoing impairments in strength, balance, activity tolerance, pain management, and memory, and minimize fall risk.  Equipment: No equipment provided-patient has needed DME  Reasons for discharge: treatment goals met and discharge from hospital  Patient/family agrees with progress made and goals achieved: Yes  PT Discharge Pain Pain Assessment Pain Assessment: No/denies pain Vision/Perception   No changes from baseline  Cognition Orientation Level: Oriented X4 Sensation Sensation Light Touch: Appears Intact Stereognosis: Appears Intact Hot/Cold: Appears Intact Proprioception: Appears Intact Coordination Gross Motor Movements are Fluid and Coordinated: Yes Fine Motor Movements are Fluid and Coordinated: Yes Heel Shin Test: impaired RLE > LLE due to [name] deficits/difficulty following verbal/visual cues Motor  Motor Motor: Other (comment) Motor - Discharge Observations: generalized weakness  Mobility Bed Mobility Bed Mobility: Supine to Sit;Sit to Supine Supine to Sit: 5: Supervision;HOB flat Supine to Sit Details: Verbal cues for technique Sit to Supine: 5: Supervision;HOB flat Sit to Supine - Details: Verbal cues for technique Transfers Transfers: Yes Stand Pivot Transfers: 5: Supervision;With armrests Locomotion   Ambulation Ambulation: Yes Ambulation/Gait Assistance: 5: Supervision Ambulation Distance (Feet): 200 Feet Assistive device: Rolling walker Gait Gait: Yes Gait Pattern: Within Functional Limits Gait velocity: decreaesd Stairs / Additional Locomotion Stairs: Yes Stairs Assistance: 4: Min guard Stair Management Technique: With cane;Other (comment) (NBQC and min HHA) Number of Stairs: 5 Height of Stairs: 6 Wheelchair Mobility Wheelchair Mobility: No (pt ambulatory)  Trunk/Postural Assessment  Cervical Assessment Cervical Assessment: Within Functional Limits Thoracic Assessment Thoracic Assessment: Exceptions to [facility] (kyphosis) Lumbar Assessment Lumbar Assessment: Exceptions to [facility] (posterior pelvic tilt in seated)  Balance Balance Balance Assessed: Yes Standardized Balance Assessment Standardized Balance Assessment: Berg Balance Test Berg Balance Test Sit to Stand: Able to stand  independently using hands Standing Unsupported: Able to stand safely 2 minutes Sitting with Back Unsupported but Feet Supported on Floor or Stool: Able to sit safely and securely 2 minutes Stand to Sit: Controls descent by using hands Transfers: Able to transfer safely, definite need of hands Standing Unsupported with Eyes Closed: Able to stand 10 seconds safely Standing Ubsupported with Feet Together: Able to place feet together independently and stand 1 minute safely From Standing, Reach Forward with Outstretched Arm: Can reach forward >12 cm safely (5") From Standing Position, Pick up Object from Floor: Able to pick up shoe, needs supervision From Standing Position, Turn to Look Behind Over each Shoulder: Turn sideways only but maintains balance Turn 360 Degrees: Able to turn 360 degrees safely but slowly Standing Unsupported, Alternately Place Feet on Step/Stool: Able to stand independently and safely and complete 8 steps in 20 seconds Standing Unsupported, One Foot in Front: Able to take small  step independently and hold 30 seconds Standing on One Leg: Able to lift leg independently and hold equal to or more than 3 seconds Total Score: 43/56 Extremity Assessment  RLE Assessment RLE Assessment: Within Functional Limits (grossly 4+ to  5/5 throughout) LLE Assessment LLE Assessment: Exceptions to [facility] LLE Strength LLE Overall Strength: Deficits LLE Overall Strength Comments: grossly 4 to 4+/5 throughout except hip flexion 3+/5  See FIM for current functional status  Laretta Alstrom 06/30/2014, 11:48 AM
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
## 2014-06-30 NOTE — Progress Notes (Signed)
Physical Therapy Session Note  Patient Details  Name: Emily Harrington MRN: [id number] Date of Birth: 11/30/1950  Today's Date: 06/30/2014 PT Individual Time: 1100-1200 PT Individual Time Calculation (min): 60 min   Short Term Goals: Week 1:  PT Short Term Goal 1 (Week 1): STG's=LTG's secondary to anticipate LOS. PT Short Term Goal 1 - Progress (Week 1): Progressing toward goal  Skilled Therapeutic Interventions/Progress Updates:   Pt received semi reclined in bed, requesting to use bathroom. Pt at overall supervision level for all functional mobility. Pt requires max cues for sequencing log roll technique during sit <> supine with bed mobility on hospital bed and regular bed in ADL apartment. Pt ambulated throughout room, to/from bathroom, and on unit in controlled environment > 200 ft using RW. Gait training in home environment x 75 ft using NBQC with verbal cues for sequencing and maintaining all 4 points of cane on ground when stepping. Pt performed car transfer and furniture transfers from low compliant couch surface using RW with supervision. Pt negotiated up/don 5 stairs using NBQC and light min L HHA to simulate stairs without rails for home entry. Pt propelled w/c using BLEs x 200 ft, requires cues for safety with locking/unlocking brakes. Berg Balance Scale administered with score of 43/56, see details below. Pt educated on results indicating that she remains at risk for falls although impairments noted during testing more consistent with pain and weakness than true balance deficits. Pt verbalized understanding. Pt left semi reclined in bed with all needs within reach.   Therapy Documentation Precautions:  Precautions Precautions: Fall Restrictions Weight Bearing Restrictions: No Pain: Pain Assessment: ongoing 8/10 acute ribcage pain, repositioned and rest Balance: Balance Balance Assessed: Yes Standardized Balance Assessment Standardized Balance Assessment: Berg Balance  Test Berg Balance Test Sit to Stand: Able to stand  independently using hands Standing Unsupported: Able to stand safely 2 minutes Sitting with Back Unsupported but Feet Supported on Floor or Stool: Able to sit safely and securely 2 minutes Stand to Sit: Controls descent by using hands Transfers: Able to transfer safely, definite need of hands Standing Unsupported with Eyes Closed: Able to stand 10 seconds safely Standing Ubsupported with Feet Together: Able to place feet together independently and stand 1 minute safely From Standing, Reach Forward with Outstretched Arm: Can reach forward >12 cm safely (5") From Standing Position, Pick up Object from Floor: Able to pick up shoe, needs supervision From Standing Position, Turn to Look Behind Over each Shoulder: Turn sideways only but maintains balance Turn 360 Degrees: Able to turn 360 degrees safely but slowly Standing Unsupported, Alternately Place Feet on Step/Stool: Able to stand independently and safely and complete 8 steps in 20 seconds Standing Unsupported, One Foot in Front: Able to take small step independently and hold 30 seconds Standing on One Leg: Able to lift leg independently and hold equal to or more than 3 seconds Total Score: 43/56  See FIM for current functional status  Therapy/Group: Individual Therapy  Kerney Elbe 06/30/2014, 12:14 PM

## 2014-06-30 NOTE — Progress Notes (Signed)
Occupational Therapy Session Note  Patient Details  Name: Emily Harrington MRN: [id number] Date of Birth: Jul 31, 1951  Today's Date: 06/30/2014 OT Individual Time:  0730- 0834 Total Time:  64 min      Short Term Goals: Week 2:  OT Short Term Goal 1 (Week 2): STG = LTGs due to ELOS  Skilled Therapeutic Interventions/Progress Updates:   Focused on ADL bathing, grooming, and dressing task to increase safety awareness upon D/C when returning home.  Pt collected and set-up items needed (towels, clothes) prior to engaging in bathing task at sink with sit<>stand using w/c.  Provided verbal cues to lock w/c brakes and allowing necessary space in between Pt and sink prior to standing to pull underwear and pants over hips to increase standing stability and safety.  Using BUE Pt externally rotated shoulders to 90 degrees while combing hair for approximately six minutes, requiring short rest breaks during task.  Engaged in toileting task using RW <> elevated commode with grab bars with supervision.  Encouraged Pt to utilize grab bars during task and upon D/C to increase safety.  Pt ambulated using RW, practicing hip and trunk flexion while bending forward, to retrieve and place items in bag located in bottom drawer.  Pt requested to return to bed at end of session, using bed rails for assistance during bed mobility and positioning body.  Pt reports bed at home does not have bed rails.  Therapy Documentation Precautions:  Precautions Precautions: Fall Restrictions Weight Bearing Restrictions: No General:   Vital Signs:   Pain: Pain Assessment Pain Assessment: No/denies pain ADL: ADL ADL Comments: See FIM  See FIM for current functional status  Therapy/Group: Individual Therapy  Loie Jahr 06/30/2014, 10:39 AM

## 2014-06-30 NOTE — Progress Notes (Signed)
Social Work Discharge Note Discharge Note  The overall goal for the admission was met for:   Discharge location: Yes-HOME TO DAUGHTER;S [facility] WITH FOR ONE WEEK  Length of Stay: Yes-10 DAYS  Discharge activity level: Yes-SUPERVISION/MOD/I LEVEL  Home/community participation: Yes  Services provided included: MD, RD, PT, OT, SLP, RN, CM, TR, Pharmacy and [place]: Private Insurance: C[employer]  Follow-up services arranged: Home Health: [facility]-PT,OT,RN and Patient/Family has no preference for HH/DME agencies  Comments (or additional information):FAMILY EDUCATION COMPLETED WITH DAUGHTER TODAY, READY FOR DISCHARGE  Patient/Family verbalized understanding of follow-up arrangements: Yes  Individual responsible for coordination of the follow-up plan: SELF & MICHELLE-DAUGHTER  Confirmed correct DME delivered: Elease Hashimoto 06/30/2014    Elease Hashimoto

## 2014-06-30 NOTE — Discharge Instructions (Signed)
Inpatient Rehab Discharge Instructions  Emily Harrington Emily Harrington [facility] Discharge date and time: 06/29/14   Activities/Precautions/ Functional Status: Activity: activity as tolerated Diet: regular diet Wound Care: none needed  Functional status:  ___ No restrictions     ___ Walk up steps independently ___ 24/7 supervision/assistance   ___ Walk up steps with assistance _X__ Intermittent supervision/assistance  ___ Bathe/dress independently ___ Walk with walker     ___ Bathe/dress with assistance ___ Walk Independently    ___ Shower independently ___ Walk with assistance    ___ Shower with assistance _X__ No alcohol     ___ Return to work/school ________  Special Instructions: 1. No Driving till you stop using pain medications.     COMMUNITY REFERRALS UPON DISCHARGE:    Home Health:   PT, OT, RN   Agency:[facility] Phone:[phone] Date of last service:07/01/2014  Medical Equipment/Items Ordered:HAS ALL FROM PREVIOUS HEALTH ISSUES    My questions have been answered and I understand these instructions. I will adhere to these goals and the provided educational materials after my discharge from the hospital.  Patient/Caregiver Signature _______________________________ Date __________  Clinician Signature _______________________________________ Date __________  Please bring this form and your medication list with you to all your follow-up doctor's appointments.

## 2014-07-01 ENCOUNTER — Encounter (HOSPITAL_COMMUNITY): Payer: Self-pay | Admitting: Occupational Therapy

## 2014-07-01 ENCOUNTER — Inpatient Hospital Stay (HOSPITAL_COMMUNITY): Payer: No Typology Code available for payment source | Admitting: Speech Pathology

## 2014-07-01 ENCOUNTER — Ambulatory Visit (HOSPITAL_COMMUNITY): Payer: Self-pay | Admitting: Physical Therapy

## 2014-07-01 ENCOUNTER — Inpatient Hospital Stay (HOSPITAL_COMMUNITY): Payer: Self-pay | Admitting: Physical Therapy

## 2014-07-01 DIAGNOSIS — S32009A Unspecified fracture of unspecified lumbar vertebra, initial encounter for closed fracture: Secondary | ICD-10-CM

## 2014-07-01 DIAGNOSIS — W19XXXA Unspecified fall, initial encounter: Secondary | ICD-10-CM

## 2014-07-01 NOTE — Progress Notes (Signed)
Patient reports mild discomfort when urinating, after voiding. Diamantina Monks, RN

## 2014-07-01 NOTE — Progress Notes (Signed)
I have reviewed and agree with the attached treatment note.  Micki Cassel, OTR/L 

## 2014-07-01 NOTE — Progress Notes (Signed)
Occupational Therapy Discharge Summary  Patient Details  Name: Emily Harrington MRN: [id number] Date of Birth: 12-Nov-1950  Patient has met 11 of 11 long term goals due to improved activity tolerance, improved balance, postural control, ability to compensate for deficits and improved awareness.  Patient to discharge at overall Supervision level.  Patient's care partner is independent to provide the necessary physical and cognitive assistance at discharge.  Daughter present for family education and reports providing supervision during bathing tasks PTA, and that pt is doing much better and back to baseline.  Reasons goals not met:   Recommendation:  Patient will benefit from ongoing skilled OT services in home health setting to continue to advance functional skills in the area of BADL, iADL and Reduce care partner burden.  Equipment: No equipment provided  Reasons for discharge: treatment goals met and discharge from hospital  Patient/family agrees with progress made and goals achieved: Yes  OT Discharge Precautions/Restrictions  Precautions Precautions: Fall Pain Pain Assessment Pain Assessment: No/denies pain ADL ADL ADL Comments: See FIM Vision/Perception     Cognition Overall Cognitive Status: History of cognitive impairments - at baseline Arousal/Alertness: Awake/alert Orientation Level: Oriented X4 Safety/Judgment: Appears intact Sensation Sensation Light Touch: Appears Intact Hot/Cold: Appears Intact Proprioception: Appears Intact Coordination Gross Motor Movements are Fluid and Coordinated: Yes Fine Motor Movements are Fluid and Coordinated: Yes Extremity/Trunk Assessment RUE Assessment RUE Assessment: Within Functional Limits LUE Assessment LUE Assessment: Exceptions to [facility] (grossly 3+/5 at shoulder, due to pain)  See FIM for current functional status  Lamm,Jillana 07/01/2014, 3:33 PM

## 2014-07-01 NOTE — Progress Notes (Signed)
Occupational Therapy Session Note  Patient Details  Name: Emily Harrington MRN: [id number] Date of Birth: [birth year]  Today's Date: 07/01/2014 OT Individual Time:  1000- 1045 Total Time:  45 min      Short Term Goals: Week 2:  OT Short Term Goal 1 (Week 2): STG = LTGs due to ELOS  Skilled Therapeutic Interventions/Progress Updates:  Engaged in family education with Pt's daughter, with whom she will be living with upon D/C.  Pt retrieved all necessary objects with no verbal cues prior to engaging in ADL bathing, grooming, and dressing tasks at sink.  Due to increased chest pain with trunk and hip flexion movements during tasks, Pt used foot stool to assist with donning shoes and socks.  Discussed with daughter the benefits of using a foot stool and AE (reacher, sock aid, and long handled shoe horn) at home to assist with decreased [name] during tasks.  Daughter reports of currently owning all of the above.  Completed toileting task using RW with elevated toilet seat at mod I.  Encouraged Pt to use elevated toilet seat grab bars for safety with sit <> stand, daughter stating home bathroom does not have installed grab bars next to toilet.  Simulated tub shower transfer with RW <> tub shower bench at supervision level, discussing with daughter the importance of installed grab bar(s) in shower for safety due to Pt's decreased balance and stability with shower standing to wash buttocks and with transfers.  Discussed with daughter our suggestion of Pt not showering alone at home unless supervision is present, daughter reports providing set-up and supervision during bathing prior to admission. Daughter had no other additional questions or concerns at end of session and upon Pt returning home, reporting she would be at home with Pt for one week upon D/C.          Therapy Documentation Precautions:  Precautions Precautions: Fall Restrictions Weight Bearing Restrictions: No General:   Vital  Signs: Oxygen Therapy O2 Device: None (Room air) Pain: Pain Assessment Pain Assessment: 0-10 Pain Score: 6  Pain Type: Acute pain Pain Location: Back Pain Intervention(s): Medication (See eMAR) ADL: ADL ADL Comments: See FIM  See FIM for current functional status  Therapy/Group: Individual Therapy  Emily Harrington 07/01/2014, 12:09 PM

## 2014-07-01 NOTE — Progress Notes (Signed)
Physical Therapy Session Note  Patient Details  Name: Samar Venneman MRN: [id number] Date of Birth: 02-13-1951  Today's Date: 07/01/2014 PT Individual Time: 1045-1105 PT Individual Time Calculation (min): 20 min   Short Term Goals: Week 1:  PT Short Term Goal 1 (Week 1): STG's=LTG's secondary to anticipate LOS. PT Short Term Goal 1 - Progress (Week 1): Progressing toward goal  Skilled Therapeutic Interventions/Progress Updates:   Session focused on family training with daughter. Patient performed ambulation x 150 ft using RW, bed <> w/c transfers, stair negotiation using NBQC and L HHA, and bed mobility on regular bed in ADL apartment with cues for sequencing log roll technique to minimize back pain. Daughter provided appropriate supervision for all functional mobility. Patient and daughter with no further questions regarding discharge.    Therapy Documentation Precautions:  Precautions Precautions: Fall Restrictions Weight Bearing Restrictions: No Vital Signs: Oxygen Therapy O2 Device: None (Room air) Pain: Pain Assessment Pain Assessment: 0-10 Pain Score: 6  Pain Type: Acute pain Pain Location: Back Pain Intervention(s): Medication (See eMAR)  See FIM for current functional status  Therapy/Group: Individual Therapy  Kerney Elbe 07/01/2014, 11:15 AM

## 2014-07-01 NOTE — Progress Notes (Signed)
Pt discharged to home with daughter at 89. Discharge instructions given by Marissa Nestle, PA to daughter and pt with verbal understanding.
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
## 2014-07-01 NOTE — Progress Notes (Signed)
Speech Language Pathology Discharge Summary  Patient Details  Name: Emily Harrington MRN: [id number] Date of Birth: Feb 28, 1951  Today's Date: 07/01/2014 SLP Individual Time: 9093-1121 SLP Individual Time Calculation (min): 12 min   Skilled Therapeutic Interventions:  Pt was seen for skilled speech therapy targeting family education prior to discharge.  Pt's daughter was present for duration of therapy session.  SLP provided skilled education related to goals and progress made while inpatient including use of skilled compensatory strategies for memory impairments.  Pt and pt's daughter both report return to cognitive baseline.  SLP recommended that pt have initial assist for high level ADLs such as medication and financial management at discharge.  Pt and pt's daughter agreeable to recommendations.      Patient has met 3 of 3 long term goals.  Patient to discharge at overall Supervision level.  Reasons goals not met: n/a   Clinical Impression/Discharge Summary:  Pt made functional gains while inpatient and is discharging having met 3 out of 3 long term goals due to improved use of compensatory strategies for memory, awareness, and problem solving.  Pt is at baseline level of cognitive functioning and is discharging home with her daughter where she will have supervision for safety and assistance for medication and financial management.  Pt and family education is complete at this time.  No further ST needs are indicated at discharge.    Care Partner:  Caregiver Able to Provide Assistance: Yes  Type of Caregiver Assistance: Physical  Recommendation:  None      Equipment: none recommended by SLP   Reasons for discharge: Discharged from hospital   Patient/Family Agrees with Progress Made and Goals Achieved: Yes   See FIM for current functional status  Windell Moulding, M.A. CCC-SLP  Jerry Haugen, Selinda Orion 07/01/2014, 5:12 PM

## 2014-07-11 NOTE — Progress Notes (Signed)
I have reviewed and agree with the attached treatment note.  Makeila Yamaguchi, OTR/L 

## 2023-01-07 ENCOUNTER — Encounter: Payer: Self-pay | Admitting: Ophthalmology

## 2023-01-09 NOTE — Discharge Instructions (Signed)

## 2023-01-13 ENCOUNTER — Encounter
Admission: RE | Disposition: A | Discharge status: Home / Self Care | Payer: Self-pay | Source: Home / Self Care | Attending: Ophthalmology

## 2023-01-13 ENCOUNTER — Ambulatory Visit: Payer: Medicare HMO | Admitting: Anesthesiology

## 2023-01-13 ENCOUNTER — Other Ambulatory Visit: Payer: Self-pay

## 2023-01-13 ENCOUNTER — Encounter: Payer: Self-pay | Admitting: Ophthalmology

## 2023-01-13 ENCOUNTER — Ambulatory Visit
Admission: RE | Admit: 2023-01-13 | Discharge: 2023-01-13 | Disposition: A | Discharge status: Home / Self Care | Payer: Medicare HMO | Attending: Ophthalmology | Admitting: Ophthalmology

## 2023-01-13 DIAGNOSIS — N183 Chronic kidney disease, stage 3 unspecified: Secondary | ICD-10-CM | POA: Diagnosis not present

## 2023-01-13 DIAGNOSIS — Z8674 Personal history of sudden cardiac arrest: Secondary | ICD-10-CM | POA: Insufficient documentation

## 2023-01-13 DIAGNOSIS — Z8542 Personal history of malignant neoplasm of other parts of uterus: Secondary | ICD-10-CM | POA: Insufficient documentation

## 2023-01-13 DIAGNOSIS — J449 Chronic obstructive pulmonary disease, unspecified: Secondary | ICD-10-CM | POA: Diagnosis not present

## 2023-01-13 DIAGNOSIS — I252 Old myocardial infarction: Secondary | ICD-10-CM | POA: Diagnosis not present

## 2023-01-13 DIAGNOSIS — I129 Hypertensive chronic kidney disease with stage 1 through stage 4 chronic kidney disease, or unspecified chronic kidney disease: Secondary | ICD-10-CM | POA: Insufficient documentation

## 2023-01-13 DIAGNOSIS — Z79899 Other long term (current) drug therapy: Secondary | ICD-10-CM | POA: Diagnosis not present

## 2023-01-13 DIAGNOSIS — Z7951 Long term (current) use of inhaled steroids: Secondary | ICD-10-CM | POA: Diagnosis not present

## 2023-01-13 DIAGNOSIS — H2512 Age-related nuclear cataract, left eye: Secondary | ICD-10-CM | POA: Insufficient documentation

## 2023-01-13 HISTORY — DX: Presence of dental prosthetic device (complete) (partial): Z97.2

## 2023-01-13 HISTORY — DX: Personal history of urinary calculi: Z87.442

## 2023-01-13 HISTORY — DX: Chronic kidney disease, stage 3 unspecified: N18.30

## 2023-01-13 HISTORY — DX: Unspecified osteoarthritis, unspecified site: M19.90

## 2023-01-13 HISTORY — DX: Malignant neoplasm of uterus, part unspecified: C55

## 2023-01-13 HISTORY — PX: CATARACT EXTRACTION W/PHACO: SHX586

## 2023-01-13 SURGERY — PHACOEMULSIFICATION, CATARACT, WITH IOL INSERTION
Anesthesia: Monitor Anesthesia Care | Site: Eye | Laterality: Left

## 2023-01-13 MED ORDER — BRIMONIDINE TARTRATE-TIMOLOL 0.2-0.5 % OP SOLN
OPHTHALMIC | Status: DC | PRN
  Administered 2023-01-13: 1 [drp] via OPHTHALMIC

## 2023-01-13 MED ORDER — SIGHTPATH DOSE#1 NA HYALUR & NA CHOND-NA HYALUR IO KIT
PACK | INTRAOCULAR | Status: DC | PRN
  Administered 2023-01-13: 1 via OPHTHALMIC

## 2023-01-13 MED ORDER — FENTANYL CITRATE (PF) 100 MCG/2ML IJ SOLN
INTRAMUSCULAR | Status: DC | PRN
  Administered 2023-01-13: 50 ug via INTRAVENOUS

## 2023-01-13 MED ORDER — TETRACAINE HCL 0.5 % OP SOLN
1.0000 [drp] | OPHTHALMIC | Status: DC | PRN
  Administered 2023-01-13 (×3): 1 [drp] via OPHTHALMIC

## 2023-01-13 MED ORDER — SIGHTPATH DOSE#1 BSS IO SOLN
INTRAOCULAR | Status: DC | PRN
  Administered 2023-01-13: 15 mL

## 2023-01-13 MED ORDER — ARMC OPHTHALMIC DILATING DROPS
1.0000 | OPHTHALMIC | Status: DC | PRN
  Administered 2023-01-13 (×3): 1 via OPHTHALMIC

## 2023-01-13 MED ORDER — LIDOCAINE HCL (PF) 2 % IJ SOLN
INTRAOCULAR | Status: DC | PRN
  Administered 2023-01-13: 1 mL via INTRAOCULAR

## 2023-01-13 MED ORDER — LACTATED RINGERS IV SOLN: INTRAVENOUS | Status: DC

## 2023-01-13 MED ORDER — MOXIFLOXACIN HCL 0.5 % OP SOLN
OPHTHALMIC | Status: DC | PRN
  Administered 2023-01-13: .2 mL via OPHTHALMIC

## 2023-01-13 MED ORDER — MIDAZOLAM HCL 2 MG/2ML IJ SOLN
INTRAMUSCULAR | Status: DC | PRN
  Administered 2023-01-13: 1 mg via INTRAVENOUS

## 2023-01-13 MED ORDER — SIGHTPATH DOSE#1 BSS IO SOLN
INTRAOCULAR | Status: DC | PRN
  Administered 2023-01-13: 117 mL via OPHTHALMIC

## 2023-01-13 SURGICAL SUPPLY — 22 items
CANNULA ANT/CHMB 27G (MISCELLANEOUS) IMPLANT
CANNULA ANT/CHMB 27GA (MISCELLANEOUS) IMPLANT
CATARACT SUITE SIGHTPATH (MISCELLANEOUS) ×1 IMPLANT
DISSECTOR HYDRO NUCLEUS 50X22 (MISCELLANEOUS) ×2 IMPLANT
DRSG TEGADERM 2-3/8X2-3/4 SM (GAUZE/BANDAGES/DRESSINGS) ×2 IMPLANT
FEE CATARACT SUITE SIGHTPATH (MISCELLANEOUS) ×2 IMPLANT
GLOVE SURG SYN 7.5  E (GLOVE) ×1
GLOVE SURG SYN 7.5 E (GLOVE) ×1 IMPLANT
GLOVE SURG SYN 7.5 PF PI (GLOVE) ×2 IMPLANT
GLOVE SURG SYN 8.5  E (GLOVE) ×1
GLOVE SURG SYN 8.5 E (GLOVE) ×1 IMPLANT
GLOVE SURG SYN 8.5 PF PI (GLOVE) ×2 IMPLANT
LENS IOL TECNIS EYHANCE 23.0 (Intraocular Lens) IMPLANT
NDL FILTER BLUNT 18X1 1/2 (NEEDLE) IMPLANT
NEEDLE FILTER BLUNT 18X1 1/2 (NEEDLE) IMPLANT
PACK VIT ANT 23G (MISCELLANEOUS) IMPLANT
RING MALYGIN 7.0 (MISCELLANEOUS) IMPLANT
SUT ETHILON 10-0 CS-B-6CS-B-6 (SUTURE)
SUTURE EHLN 10-0 CS-B-6CS-B-6 (SUTURE) IMPLANT
SYR 3ML LL SCALE MARK (SYRINGE) IMPLANT
SYR 5ML LL (SYRINGE) IMPLANT
WATER STERILE IRR 250ML POUR (IV SOLUTION) ×2 IMPLANT

## 2023-01-13 NOTE — H&P (Signed)
[facility]   Primary Care Physician:  Remi Haggard, FNP Ophthalmologist: Dr. Merleen Nicely  Pre-Procedure History & Physical: HPI:  Primrose Borum is a 72 y.o. female here for cataract surgery.   Past Medical History:  Diagnosis Date   Arthritis    Asthma    Cardiopulmonary arrest ([place])    Pt denies   CKD (chronic kidney disease), stage III (HCC)    COPD (chronic obstructive pulmonary disease) (HCC)    H/O tracheostomy    History of kidney stones    Hypertension    Kidney stone    Myocardial infarct (HCC)    Pt denies   Renal disorder    Renal insufficiency    Toxic metabolic encephalopathy    Uterine cancer (HCC)    Wears dentures    full upper and lower    Past Surgical History:  Procedure Laterality Date   ABDOMINAL HYSTERECTOMY     CHOLECYSTECTOMY      Prior to Admission medications   Medication Sig Start Date End Date Taking? Authorizing Provider  albuterol (PROVENTIL HFA;VENTOLIN HFA) 108 (90 BASE) MCG/ACT inhaler Inhale 2 puffs into the lungs every 4 (four) hours as needed. For wheezing or shortness of breath. 10/26/11 01/07/23 Yes Angus Palms., MD  amitriptyline (ELAVIL) 100 MG tablet Take 100 mg by mouth at bedtime.   Yes [provider]  ATORVASTATIN CALCIUM PO Take by mouth daily.   Yes [provider]  Cholecalciferol (VITAMIN D-3) 5000 UNITS TABS Take 5,000 Units by mouth daily.   Yes [provider]  Dapagliflozin Propanediol (FARXIGA PO) Take by mouth daily.   Yes [provider]  diazepam (VALIUM) 5 MG tablet Take 5 mg by mouth every 6 (six) hours as needed for anxiety or muscle spasms.   Yes [provider]  Finerenone (KERENDIA PO) Take by mouth daily.   Yes [provider]  Fluticasone-Umeclidin-Vilant (TRELEGY ELLIPTA IN) Inhale into the lungs daily.   Yes [provider]  GARLIC PO Take by mouth daily.   Yes [provider]  Multiple Vitamin  (MULTIVITAMIN) capsule Take 1 capsule by mouth daily.    Yes [provider]  pentazocine-naloxone (TALWIN NX) 50-0.5 MG per tablet Take 2 tablets by mouth every 12 (twelve) hours.   Yes [provider]  potassium citrate (UROCIT-K) 10 MEQ (1080 MG) SR tablet Take 20 mEq by mouth 2 (two) times daily.   Yes [provider]    Allergies as of 12/27/2022 - Review Complete 06/21/2014  Allergen Reaction Noted   Nsaids Shortness Of Breath 01/08/2012    History reviewed. No pertinent family history.  Social History   Socioeconomic History   Marital status: Single    Spouse name: Not on file   Number of children: Not on file   Years of education: Not on file   Highest education level: Not on file  Occupational History   Not on file  Tobacco Use   Smoking status: Never    Passive exposure: Yes   Smokeless tobacco: Never  Vaping Use   Vaping Use: Never used  Substance and Sexual Activity   Alcohol use: No   Drug use: No   Sexual activity: Not on file  Other Topics Concern   Not on file  Social History Narrative   Not on file   Social Determinants of Health   Financial Resource Strain: Not on file  Food Insecurity: Not on file  Transportation Needs: Not  on file  Physical Activity: Not on file  Stress: Not on file  Social Connections: Not on file  Intimate Partner Violence: Not on file    Review of Systems: See HPI, otherwise negative ROS  Physical Exam: Ht 5\' 7"  (1.702 m)   Wt 58.5 kg   BMI 20.20 kg/m  General:   Alert, cooperative in NAD Head:  Normocephalic and atraumatic. Respiratory:  Normal work of breathing. Cardiovascular:  RRR  Impression/Plan: Adaleigh Clendenen is here for cataract surgery.  Risks, benefits, limitations, and alternatives regarding cataract surgery have been reviewed with the patient.  Questions have been answered.  All parties agreeable.   Norvel Richards, MD  01/13/2023, 7:09 AM

## 2023-01-13 NOTE — Transfer of Care (Signed)
Immediate Anesthesia Transfer of Care Note  Patient: Emily Harrington  Procedure(s) Performed: CATARACT EXTRACTION PHACO AND INTRAOCULAR LENS PLACEMENT ([place]) LEFT MALYUGIN OMIDRIA (Left: Eye)  Patient Location: PACU  Anesthesia Type: MAC  Level of Consciousness: awake, alert  and patient cooperative  Airway and Oxygen Therapy: Patient Spontanous Breathing and Patient connected to supplemental oxygen  Post-op Assessment: Post-op Vital signs reviewed, Patient's Cardiovascular Status Stable, Respiratory Function Stable, Patent Airway and No signs of Nausea or vomiting  Post-op Vital Signs: Reviewed and stable  Complications: No notable events documented.

## 2023-01-13 NOTE — Op Note (Signed)
OPERATIVE NOTE  Emily Harrington [id number] 01/13/2023   PREOPERATIVE DIAGNOSIS: Nuclear sclerotic cataract left eye. H25.12   POSTOPERATIVE DIAGNOSIS: Nuclear sclerotic cataract left eye. H25.12   PROCEDURE:  Phacoemusification with posterior chamber intraocular lens placement of the left eye  Ultrasound time: Procedure(s) with comments: CATARACT EXTRACTION PHACO AND INTRAOCULAR LENS PLACEMENT (IOC) LEFT MALYUGIN OMIDRIA (Left) - 21.20 1:589  LENS:   Implant Name Type Inv. Item Serial No. Manufacturer Lot No. LRB No. Used Action  LENS IOL TECNIS EYHANCE 23.0 - [id number] Intraocular Lens LENS IOL TECNIS EYHANCE 23.0 [id number] [employer]  Left 1 Implanted      SURGEON:  Courtney Heys. Lazarus Salines, MD   ANESTHESIA:  Topical with tetracaine drops, augmented with 1% preservative-free intracameral lidocaine.   COMPLICATIONS:  None.   DESCRIPTION OF PROCEDURE:  The patient was identified in the holding room and transported to the operating room and placed in the supine position under the operating microscope.  The left eye was identified as the operative eye, which was prepped and draped in the usual sterile ophthalmic fashion.   A 1 millimeter clear-corneal paracentesis was made inferotemporally. Preservative-free 1% lidocaine mixed with 1:1,000 bisulfite-free aqueous solution of epinephrine was injected into the anterior chamber. The anterior chamber was then filled with Viscoat viscoelastic. A 2.4 millimeter keratome was used to make a clear-corneal incision superotemporally. A curvilinear capsulorrhexis was made with a cystotome and capsulorrhexis forceps. Balanced salt solution was used to hydrodissect and hydrodelineate the nucleus. Phacoemulsification was then used to remove the lens nucleus and epinucleus. The remaining cortex was then removed using the irrigation and aspiration handpiece. Provisc was then placed into the capsular bag to distend it for lens placement. A +23.00 D DIB00  intraocular lens was then injected into the capsular bag. The remaining viscoelastic was aspirated.   Wounds were hydrated with balanced salt solution.  The anterior chamber was inflated to a physiologic pressure with balanced salt solution.  No wound leaks were noted. Vigamox was injected intracamerally.  Timolol and Brimonidine drops were applied to the eye.  The patient was taken to the recovery room in stable condition without complications of anesthesia or surgery.  [employer] 01/13/2023, 1:25 PM

## 2023-01-13 NOTE — Anesthesia Preprocedure Evaluation (Signed)
Anesthesia Evaluation  Patient identified by MRN, date of birth, ID band Patient awake    Reviewed: Allergy & Precautions, NPO status , Patient's Chart, lab work & pertinent test results  Airway Mallampati: III  TM Distance: >3 FB Neck [name]: full    Dental  (+) Chipped, Dental Advidsory Given   Pulmonary neg shortness of breath, COPD,  COPD inhaler   Pulmonary exam normal        Cardiovascular hypertension, On Medications (-) angina negative cardio ROS Normal cardiovascular exam     Neuro/Psych negative neurological ROS  negative psych ROS   GI/Hepatic negative GI ROS, Neg liver ROS,,,  Endo/Other  negative endocrine ROS    Renal/GU CRFRenal disease     Musculoskeletal   Abdominal   Peds  Hematology negative hematology ROS (+)   Anesthesia Other Findings Past Medical History: No date: Arthritis No date: Asthma No date: Cardiopulmonary arrest (HCC)     Comment:  Pt denies No date: CKD (chronic kidney disease), stage III (HCC) No date: COPD (chronic obstructive pulmonary disease) (HCC) No date: H/O tracheostomy No date: History of kidney stones No date: Hypertension No date: Kidney stone No date: Myocardial infarct (HCC)     Comment:  Pt denies No date: Renal disorder No date: Renal insufficiency No date: Toxic metabolic encephalopathy No date: Uterine cancer ([place]) No date: Wears dentures     Comment:  full upper and lower  Past Surgical History: No date: ABDOMINAL HYSTERECTOMY No date: CHOLECYSTECTOMY  BMI    Body Mass Index: 20.51 kg/m      Reproductive/Obstetrics negative OB ROS                             Anesthesia Physical Anesthesia Plan  ASA: 3  Anesthesia Plan: MAC   Post-op Pain Management:    Induction: Intravenous  PONV Risk Score and Plan: 2  Airway Management Planned: Natural Airway and Nasal Cannula  Additional Equipment:   Intra-op Plan:    Post-operative Plan:   Informed Consent: I have reviewed the patients History and Physical, chart, labs and discussed the procedure including the risks, benefits and alternatives for the proposed anesthesia with the patient or authorized representative who has indicated his/her understanding and acceptance.     Dental Advisory Given  Plan Discussed with: Anesthesiologist, CRNA and Surgeon  Anesthesia Plan Comments: (Patient consented for risks of anesthesia including but not limited to:  - adverse reactions to medications - damage to eyes, teeth, lips or other oral mucosa - nerve damage due to positioning  - sore throat or hoarseness - Damage to heart, brain, nerves, lungs, other parts of body or loss of life  Patient voiced understanding.)       Anesthesia Quick Evaluation

## 2023-01-14 ENCOUNTER — Encounter: Payer: Self-pay | Admitting: Ophthalmology

## 2023-01-14 NOTE — Anesthesia Postprocedure Evaluation (Addendum)
Anesthesia Post Note  Patient: Emily Harrington  Procedure(s) Performed: CATARACT EXTRACTION PHACO AND INTRAOCULAR LENS PLACEMENT ([facility]) LEFT MALYUGIN OMIDRIA (Left: Eye)  Patient location during evaluation: PACU Anesthesia Type: MAC Level of consciousness: awake and alert Pain management: pain level controlled Vital Signs Assessment: post-procedure vital signs reviewed and stable Respiratory status: spontaneous breathing, nonlabored ventilation, respiratory function stable and patient connected to nasal cannula oxygen Cardiovascular status: blood pressure returned to baseline and stable Postop Assessment: no apparent nausea or vomiting Anesthetic complications: no  No notable events documented.   Last Vitals:  Vitals:   01/13/23 1327 01/13/23 1331  BP: (!) 149/85 115/70  Pulse: 91 91  Resp: 18 10  Temp: (!) 36.2 C (!) 36.2 C  SpO2: 99% 99%    Last Pain:  Vitals:   01/14/23 1312  TempSrc:   PainSc: 0-No pain                 Dimas Millin

## 2023-01-21 NOTE — Discharge Instructions (Signed)

## 2023-01-22 NOTE — Anesthesia Preprocedure Evaluation (Addendum)
Anesthesia Evaluation  Patient identified by MRN, date of birth, ID band Patient awake    Airway Mallampati: III  TM Distance: <3 FB Neck [name]: Full  Mouth opening: Limited Mouth Opening  Dental  (+) Upper Dentures, Lower Dentures   Pulmonary asthma , pneumonia, resolved, COPD,  COPD inhaler Hx tracheostomy   breath sounds clear to auscultation       Cardiovascular hypertension, + Past MI and + Peripheral Vascular Disease   Rhythm:Regular Rate:Normal  01-08-11 daughter found pt down at home, Hx cardiopulmonary arrest,  and hx AMI, EF normal, patient states "they brought me back a couple of times," but "a doctor from [place] figured it was tracheomalacia."  Was in hospital a couple of months and in nursing home but doing well now.    Neuro/Psych    GI/Hepatic hiatal hernia,GERD  ,,Hiatal hernia per CT scan   Endo/Other    Renal/GU CRFRenal diseaseLeft renal atrophy, right renal normal per CT Hx kidney stones     Musculoskeletal  (+) Arthritis , Osteoarthritis,    Abdominal Normal abdominal exam  (+)   Peds  Hematology  (+) Blood dyscrasia, anemia   Anesthesia Other Findings Asthma  Hypertension Renal disorder Kidney stone Cardiopulmonary arrest  Myocardial infarct H/O tracheostomy  Renal insufficiency COPD (chronic obstructive pulmonary disease)  Toxic metabolic encephalopathy CKD (chronic kidney disease), stage III  History of kidney stones Uterine cancer Arthritis Wears dentures      Reproductive/Obstetrics                             Anesthesia Physical Anesthesia Plan  ASA: 3  Anesthesia Plan: MAC   Post-op Pain Management:    Induction:   PONV Risk Score and Plan: 1  Airway Management Planned: Nasal Cannula  Additional Equipment:   Intra-op Plan:   Post-operative Plan:   Informed Consent: I have reviewed the patients History and Physical, chart, labs and  discussed the procedure including the risks, benefits and alternatives for the proposed anesthesia with the patient or authorized representative who has indicated his/her understanding and acceptance.       Plan Discussed with: CRNA  Anesthesia Plan Comments:        Anesthesia Quick Evaluation

## 2023-01-23 ENCOUNTER — Ambulatory Visit
Admission: RE | Admit: 2023-01-23 | Discharge: 2023-01-23 | Disposition: A | Discharge status: Home / Self Care | Payer: Medicare HMO | Attending: Ophthalmology | Admitting: Ophthalmology

## 2023-01-23 ENCOUNTER — Encounter
Admission: RE | Disposition: A | Discharge status: Home / Self Care | Payer: Self-pay | Source: Home / Self Care | Attending: Ophthalmology

## 2023-01-23 ENCOUNTER — Ambulatory Visit: Payer: Medicare HMO | Admitting: Anesthesiology

## 2023-01-23 ENCOUNTER — Encounter: Payer: Self-pay | Admitting: Ophthalmology

## 2023-01-23 ENCOUNTER — Other Ambulatory Visit: Payer: Self-pay

## 2023-01-23 DIAGNOSIS — H2511 Age-related nuclear cataract, right eye: Secondary | ICD-10-CM | POA: Diagnosis present

## 2023-01-23 DIAGNOSIS — N183 Chronic kidney disease, stage 3 unspecified: Secondary | ICD-10-CM | POA: Diagnosis not present

## 2023-01-23 DIAGNOSIS — I739 Peripheral vascular disease, unspecified: Secondary | ICD-10-CM | POA: Diagnosis not present

## 2023-01-23 DIAGNOSIS — I129 Hypertensive chronic kidney disease with stage 1 through stage 4 chronic kidney disease, or unspecified chronic kidney disease: Secondary | ICD-10-CM | POA: Diagnosis not present

## 2023-01-23 DIAGNOSIS — Z7951 Long term (current) use of inhaled steroids: Secondary | ICD-10-CM | POA: Diagnosis not present

## 2023-01-23 DIAGNOSIS — Z8674 Personal history of sudden cardiac arrest: Secondary | ICD-10-CM | POA: Diagnosis not present

## 2023-01-23 DIAGNOSIS — J4489 Other specified chronic obstructive pulmonary disease: Secondary | ICD-10-CM | POA: Insufficient documentation

## 2023-01-23 DIAGNOSIS — I252 Old myocardial infarction: Secondary | ICD-10-CM | POA: Diagnosis not present

## 2023-01-23 DIAGNOSIS — M1990 Unspecified osteoarthritis, unspecified site: Secondary | ICD-10-CM | POA: Insufficient documentation

## 2023-01-23 HISTORY — PX: CATARACT EXTRACTION W/PHACO: SHX586

## 2023-01-23 SURGERY — PHACOEMULSIFICATION, CATARACT, WITH IOL INSERTION
Anesthesia: Monitor Anesthesia Care | Site: Eye | Laterality: Right

## 2023-01-23 MED ORDER — MIDAZOLAM HCL 2 MG/2ML IJ SOLN
INTRAMUSCULAR | Status: DC | PRN
  Administered 2023-01-23: 1 mg via INTRAVENOUS

## 2023-01-23 MED ORDER — SIGHTPATH DOSE#1 NA HYALUR & NA CHOND-NA HYALUR IO KIT
PACK | INTRAOCULAR | Status: DC | PRN
  Administered 2023-01-23: 1 via OPHTHALMIC

## 2023-01-23 MED ORDER — SIGHTPATH DOSE#1 BSS IO SOLN
INTRAOCULAR | Status: DC | PRN
  Administered 2023-01-23: 81 mL via OPHTHALMIC

## 2023-01-23 MED ORDER — FENTANYL CITRATE (PF) 100 MCG/2ML IJ SOLN
INTRAMUSCULAR | Status: DC | PRN
  Administered 2023-01-23: 50 ug via INTRAVENOUS

## 2023-01-23 MED ORDER — SIGHTPATH DOSE#1 BSS IO SOLN
INTRAOCULAR | Status: DC | PRN
  Administered 2023-01-23: 15 mL via INTRAOCULAR

## 2023-01-23 MED ORDER — LACTATED RINGERS IV SOLN: INTRAVENOUS | Status: DC

## 2023-01-23 MED ORDER — LIDOCAINE HCL (PF) 2 % IJ SOLN
INTRAOCULAR | Status: DC | PRN
  Administered 2023-01-23: 4 mL via INTRAOCULAR

## 2023-01-23 MED ORDER — BRIMONIDINE TARTRATE-TIMOLOL 0.2-0.5 % OP SOLN
OPHTHALMIC | Status: DC | PRN
  Administered 2023-01-23: 1 [drp] via OPHTHALMIC

## 2023-01-23 MED ORDER — TETRACAINE HCL 0.5 % OP SOLN
1.0000 [drp] | OPHTHALMIC | Status: DC | PRN
  Administered 2023-01-23 (×3): 1 [drp] via OPHTHALMIC

## 2023-01-23 MED ORDER — MOXIFLOXACIN HCL 0.5 % OP SOLN
OPHTHALMIC | Status: DC | PRN
  Administered 2023-01-23: .2 mL via OPHTHALMIC

## 2023-01-23 MED ORDER — ARMC OPHTHALMIC DILATING DROPS
1.0000 | OPHTHALMIC | Status: DC | PRN
  Administered 2023-01-23 (×3): 1 via OPHTHALMIC

## 2023-01-23 SURGICAL SUPPLY — 21 items
CANNULA ANT/CHMB 27G (MISCELLANEOUS) IMPLANT
CANNULA ANT/CHMB 27GA (MISCELLANEOUS) IMPLANT
CATARACT SUITE SIGHTPATH (MISCELLANEOUS) ×1 IMPLANT
DISSECTOR HYDRO NUCLEUS 50X22 (MISCELLANEOUS) ×2 IMPLANT
DRSG TEGADERM 2-3/8X2-3/4 SM (GAUZE/BANDAGES/DRESSINGS) ×2 IMPLANT
FEE CATARACT SUITE SIGHTPATH (MISCELLANEOUS) ×2 IMPLANT
GLOVE SURG SYN 7.5  E (GLOVE) ×1
GLOVE SURG SYN 7.5 E (GLOVE) ×1 IMPLANT
GLOVE SURG SYN 7.5 PF PI (GLOVE) ×2 IMPLANT
GLOVE SURG SYN 8.5  E (GLOVE) ×1
GLOVE SURG SYN 8.5 E (GLOVE) ×1 IMPLANT
GLOVE SURG SYN 8.5 PF PI (GLOVE) ×2 IMPLANT
LENS IOL TECNIS EYHANCE 23.0 (Intraocular Lens) IMPLANT
NDL FILTER BLUNT 18X1 1/2 (NEEDLE) IMPLANT
NEEDLE FILTER BLUNT 18X1 1/2 (NEEDLE) ×1 IMPLANT
PACK VIT ANT 23G (MISCELLANEOUS) IMPLANT
RING MALYGIN 7.0 (MISCELLANEOUS) IMPLANT
SUT ETHILON 10-0 CS-B-6CS-B-6 (SUTURE)
SUTURE EHLN 10-0 CS-B-6CS-B-6 (SUTURE) IMPLANT
SYR 3ML LL SCALE MARK (SYRINGE) IMPLANT
SYR 5ML LL (SYRINGE) IMPLANT

## 2023-01-23 NOTE — Op Note (Signed)
OPERATIVE NOTE  Airanna Hainline [id number] 01/23/2023   PREOPERATIVE DIAGNOSIS: Nuclear sclerotic cataract right eye. H25.11   POSTOPERATIVE DIAGNOSIS: Nuclear sclerotic cataract right eye. H25.11   PROCEDURE:  Phacoemusification with posterior chamber intraocular lens placement of the right eye  Ultrasound time: Procedure(s): CATARACT EXTRACTION PHACO AND INTRAOCULAR LENS PLACEMENT ([facility]) RIGHT OMIDRIA 19.11 01:33.2 (Right)  LENS:   Implant Name Type Inv. Item Serial No. Manufacturer Lot No. LRB No. Used Action  LENS IOL TECNIS EYHANCE 23.0 - [id number] Intraocular Lens LENS IOL TECNIS EYHANCE 23.0 [id number] [employer]  Right 1 Implanted      SURGEON:  Courtney Heys. Lazarus Salines, MD   ANESTHESIA:  Topical with tetracaine drops, augmented with 1% preservative-free intracameral lidocaine.   COMPLICATIONS:  None.   DESCRIPTION OF PROCEDURE:  The patient was identified in the holding room and transported to the operating room and placed in the supine position under the operating microscope.  The right eye was identified as the operative eye, which was prepped and draped in the usual sterile ophthalmic fashion.   A 1 millimeter clear-corneal paracentesis was made superotemporally. Preservative-free 1% lidocaine mixed with 1:1,000 bisulfite-free aqueous solution of epinephrine was injected into the anterior chamber. The anterior chamber was then filled with Viscoat viscoelastic. A 2.4 millimeter keratome was used to make a clear-corneal incision inferotemporally. A curvilinear capsulorrhexis was made with a cystotome and capsulorrhexis forceps. Balanced salt solution was used to hydrodissect and hydrodelineate the nucleus. Phacoemulsification was then used to remove the lens nucleus and epinucleus. The remaining cortex was then removed using the irrigation and aspiration handpiece. Provisc was then placed into the capsular bag to distend it for lens placement. A +23.00 D DIB00 intraocular lens was  then injected into the capsular bag. The remaining viscoelastic was aspirated.   Wounds were hydrated with balanced salt solution.  The anterior chamber was inflated to a physiologic pressure with balanced salt solution.  No wound leaks were noted. Vigamox was injected intracamerally.  Timolol and Brimonidine drops were applied to the eye.  The patient was taken to the recovery room in stable condition without complications of anesthesia or surgery.  Maryann Alar Edison 01/23/2023, 12:50 PM

## 2023-01-23 NOTE — H&P (Signed)
[facility]   Primary Care Physician:  Remi Haggard, FNP Ophthalmologist: Dr. Merleen Nicely  Pre-Procedure History & Physical: HPI:  Emily Harrington is a 72 y.o. female here for cataract surgery.   Past Medical History:  Diagnosis Date   Arthritis    Asthma    Cardiopulmonary arrest ([place])    Pt denies   CKD (chronic kidney disease), stage III ([place])    COPD (chronic obstructive pulmonary disease) (HCC)    H/O tracheostomy    History of kidney stones    Hypertension    Kidney stone    Myocardial infarct ([place])    Pt denies   Renal disorder    Renal insufficiency    Toxic metabolic encephalopathy    Uterine cancer ([place])    Wears dentures    full upper and lower    Past Surgical History:  Procedure Laterality Date   ABDOMINAL HYSTERECTOMY     CATARACT EXTRACTION W/PHACO Left 01/13/2023   Procedure: CATARACT EXTRACTION PHACO AND INTRAOCULAR LENS PLACEMENT ([place]) LEFT MALYUGIN OMIDRIA;  Surgeon: Norvel Richards, MD;  Location: [place];  Service: Ophthalmology;  Laterality: Left;  21.20 1:589   CHOLECYSTECTOMY      Prior to Admission medications   Medication Sig Start Date End Date Taking? Authorizing Provider  diazepam (VALIUM) 5 MG tablet Take 5 mg by mouth every 6 (six) hours as needed for anxiety or muscle spasms.   Yes [provider]  albuterol (PROVENTIL HFA;VENTOLIN HFA) 108 (90 BASE) MCG/ACT inhaler Inhale 2 puffs into the lungs every 4 (four) hours as needed. For wheezing or shortness of breath. 10/26/11 01/07/23  Angus Palms., MD  amitriptyline (ELAVIL) 100 MG tablet Take 100 mg by mouth at bedtime.    [provider]  ATORVASTATIN CALCIUM PO Take by mouth daily.    [provider]  Cholecalciferol (VITAMIN D-3) 5000 UNITS TABS Take 5,000 Units by mouth daily.    [provider]  Dapagliflozin Propanediol (FARXIGA PO) Take by mouth daily.    [provider]  Finerenone (KERENDIA PO)  Take by mouth daily.    [provider]  Fluticasone-Umeclidin-Vilant (TRELEGY ELLIPTA IN) Inhale into the lungs daily.    [provider]  GARLIC PO Take by mouth daily.    [provider]  Multiple Vitamin (MULTIVITAMIN) capsule Take 1 capsule by mouth daily.     [provider]  pentazocine-naloxone (TALWIN NX) 50-0.5 MG per tablet Take 2 tablets by mouth every 12 (twelve) hours.    [provider]  potassium citrate (UROCIT-K) 10 MEQ (1080 MG) SR tablet Take 20 mEq by mouth 2 (two) times daily.    [provider]    Allergies as of 12/27/2022 - Review Complete 06/21/2014  Allergen Reaction Noted   Nsaids Shortness Of Breath 01/08/2012    History reviewed. No pertinent family history.  Social History   Socioeconomic History   Marital status: Single    Spouse name: Not on file   Number of children: Not on file   Years of education: Not on file   Highest education level: Not on file  Occupational History   Not on file  Tobacco Use   Smoking status: Never    Passive exposure: Yes   Smokeless tobacco: Never  Vaping Use   Vaping Use: Never used  Substance and Sexual Activity   Alcohol use: No   Drug use: No   Sexual activity: Not on file  Other Topics Concern   Not on file  Social History Narrative   Not on file   Social Determinants of Health   Financial Resource Strain: Not on file  Food Insecurity: Not on file  Transportation Needs: Not on file  Physical Activity: Not on file  Stress: Not on file  Social Connections: Not on file  Intimate Partner Violence: Not on file    Review of Systems: See HPI, otherwise negative ROS  Physical Exam: Ht 5' 7.01" (1.702 m)   Wt 59.4 kg   BMI 20.51 kg/m  General:   Alert, cooperative in NAD Head:  Normocephalic and atraumatic. Respiratory:  Normal work of breathing. Cardiovascular:  RRR  Impression/Plan: Emily Harrington is here for cataract surgery.  Risks,  benefits, limitations, and alternatives regarding cataract surgery have been reviewed with the patient.  Questions have been answered.  All parties agreeable.   Norvel Richards, MD  01/23/2023, 11:25 AM

## 2023-01-23 NOTE — Transfer of Care (Signed)
Immediate Anesthesia Transfer of Care Note  Patient: Emily Harrington [facility]  Procedure(s) Performed: CATARACT EXTRACTION PHACO AND INTRAOCULAR LENS PLACEMENT ([facility]) RIGHT OMIDRIA 19.11 01:33.2 (Right: Eye)  Patient Location: PACU  Anesthesia Type: MAC  Level of Consciousness: awake, alert  and patient cooperative  Airway and Oxygen Therapy: Patient Spontanous Breathing and Patient connected to supplemental oxygen  Post-op Assessment: Post-op Vital signs reviewed, Patient's Cardiovascular Status Stable, Respiratory Function Stable, Patent Airway and No signs of Nausea or vomiting  Post-op Vital Signs: Reviewed and stable  Complications: No notable events documented.

## 2023-01-23 NOTE — Anesthesia Postprocedure Evaluation (Signed)
Anesthesia Post Note  Patient: Emily Harrington  Procedure(s) Performed: CATARACT EXTRACTION PHACO AND INTRAOCULAR LENS PLACEMENT ([facility]) RIGHT OMIDRIA 19.11 01:33.2 (Right: Eye)  Anesthesia Type: MAC Anesthetic complications: no   There were no known notable events for this encounter.   Last Vitals:  Vitals:   01/23/23 1252 01/23/23 1256  BP: (!) 125/98   Pulse: 90 90  Resp: 14 19  Temp: 36.6 C   SpO2: 99% 98%    Last Pain:  Vitals:   01/23/23 1256  TempSrc:   PainSc: 0-No pain                 [name] [name]

## 2023-01-24 ENCOUNTER — Encounter: Payer: Self-pay | Admitting: Ophthalmology

## 2023-05-07 LAB — EXTERNAL GENERIC LAB PROCEDURE

## 2023-05-07 LAB — COLOGUARD

## 2023-09-30 ENCOUNTER — Encounter: Payer: Self-pay | Admitting: Family Medicine

## 2023-10-01 ENCOUNTER — Other Ambulatory Visit: Payer: Self-pay | Admitting: Family Medicine

## 2023-10-01 DIAGNOSIS — R1084 Generalized abdominal pain: Secondary | ICD-10-CM

## 2023-10-16 ENCOUNTER — Other Ambulatory Visit: Payer: Medicare HMO

## 2023-10-20 ENCOUNTER — Ambulatory Visit
Admission: RE | Admit: 2023-10-20 | Discharge: 2023-10-20 | Disposition: A | Discharge status: Home / Self Care | Payer: Medicare HMO | Source: Ambulatory Visit | Attending: Family Medicine | Admitting: Family Medicine

## 2023-10-20 DIAGNOSIS — R1084 Generalized abdominal pain: Secondary | ICD-10-CM

## 2023-10-20 MED ORDER — IOPAMIDOL (ISOVUE-300) INJECTION 61%
100.0000 mL | Freq: Once | INTRAVENOUS | Status: AC | PRN
  Administered 2023-10-20: 75 mL via INTRAVENOUS

## 2023-11-05 ENCOUNTER — Other Ambulatory Visit: Payer: Self-pay | Admitting: Family Medicine

## 2023-11-05 DIAGNOSIS — D134 Benign neoplasm of liver: Secondary | ICD-10-CM

## 2023-11-14 ENCOUNTER — Encounter: Payer: Self-pay | Admitting: Family Medicine

## 2023-11-21 ENCOUNTER — Ambulatory Visit
Admission: RE | Admit: 2023-11-21 | Discharge: 2023-11-21 | Disposition: A | Discharge status: Home / Self Care | Payer: Medicare HMO | Source: Ambulatory Visit | Attending: Family Medicine | Admitting: Family Medicine

## 2023-11-21 DIAGNOSIS — D134 Benign neoplasm of liver: Secondary | ICD-10-CM

## 2023-11-21 MED ORDER — GADOPICLENOL 0.5 MMOL/ML IV SOLN
7.0000 mL | Freq: Once | INTRAVENOUS | Status: AC | PRN
  Administered 2023-11-21: 7 mL via INTRAVENOUS

## 2024-08-08 LAB — COLOGUARD: COLOGUARD: NEGATIVE

## 2024-08-23 NOTE — Progress Notes (Unsigned)
 Cardiology Office Note  Date:  08/24/2024   ID:  Emily, Harrington 02-26-1951, MRN [id number]  PCP:  Donal Channing SQUIBB, FNP   Chief Complaint  Patient presents with   New Patient (Initial Visit)    Ref by Channing Donal, FNP for abnormal Echo. Patient denies chest pain or shortness of breath.     HPI:  Emily Harrington is a 73 y.o. female with past medical history of: Past Medical History:  Diagnosis Date   Arthritis    Asthma    Cardiopulmonary arrest (HCC)    Pt denies   CKD (chronic kidney disease), stage III (HCC)    COPD (chronic obstructive pulmonary disease) (HCC)    H/O tracheostomy    History of kidney stones    Hypertension    Kidney stone    Myocardial infarct (HCC)    Pt denies   Renal disorder    Renal insufficiency    Toxic metabolic encephalopathy    Uterine cancer (HCC)    Wears dentures    full upper and lower  Diabetes type 2 Left hip fracture, requiring surgery March 2025 Who presents by referral from Channing Donal for carotid stenosis, hyperlipidemia  On today's visit she presents with her daughter Prior cardiac testing reviewed Echo 2023 normal ejection fraction   carotid ultrasound  May 04, 2024, moderate stenosis bilaterally  Echo May 04, 2024 EF 53% normal RV size and function No significant valvular heart disease  Notes from [facility] indicating recurrent falls, lightheadedness/ dizziness when she has been standing or exerting herself for prolonged period, wide pulse pressures with low diastolics At [facility] orthostatic vitals negative Falls could be secondary to polypharmacy, previously prescribed Valium  with other sedatives, these were stopped in the hospital  Followed by primary care for hyperlipidemia Recently started on Repatha, also on Lipitor 20  She denies any chest pain or shortness of breath on exertion Walks with a cane, limited secondary to hip and balance  EKG showing left bundle branch block, new since March 2025, no  prior EKGs available for review from 2015 to 2025  EKG personally reviewed by myself on todays visit EKG Interpretation Date/Time:  Tuesday August 24 2024 14:38:37 EST Ventricular Rate:  79 PR Interval:  166 QRS Duration:  144 QT Interval:  410 QTC Calculation: 470 R Axis:   30  Text Interpretation: Normal sinus rhythm Left bundle branch block When compared with ECG of 14-Jun-2014 20:12, Left bundle branch block is now Present Confirmed by Perla Lye [id number]) on 08/24/2024 2:45:39 PM    PMH:   has a past medical history of Arthritis, Asthma, Cardiopulmonary arrest (HCC), CKD (chronic kidney disease), stage III (HCC), COPD (chronic obstructive pulmonary disease) (HCC), H/O tracheostomy, History of kidney stones, Hypertension, Kidney stone, Myocardial infarct (HCC), Renal disorder, Renal insufficiency, Toxic metabolic encephalopathy, Uterine cancer (HCC), and Wears dentures.   PSH:    Past Surgical History:  Procedure Laterality Date   ABDOMINAL HYSTERECTOMY     CATARACT EXTRACTION W/PHACO Left 01/13/2023   Procedure: CATARACT EXTRACTION PHACO AND INTRAOCULAR LENS PLACEMENT ([facility]) LEFT MALYUGIN OMIDRIA;  Surgeon: Enola Feliciano Hugger, MD;  Location: [facility];  Service: Ophthalmology;  Laterality: Left;  21.20 1:589   CATARACT EXTRACTION W/PHACO Right 01/23/2023   Procedure: CATARACT EXTRACTION PHACO AND INTRAOCULAR LENS PLACEMENT ([facility]) RIGHT OMIDRIA 19.11 01:33.2;  Surgeon: Enola Feliciano Hugger, MD;  Location[facility];  Service: Ophthalmology;  Laterality: Right;   CHOLECYSTECTOMY      Current Outpatient  Medications  Medication Sig Dispense Refill   albuterol  (PROVENTIL  HFA;VENTOLIN  HFA) 108 (90 BASE) MCG/ACT inhaler Inhale 2 puffs into the lungs every 4 (four) hours as needed. For wheezing or shortness of breath.     amitriptyline  (ELAVIL ) 100 MG tablet Take 100 mg by mouth at bedtime.     aspirin  EC 81 MG tablet Take 81 mg by mouth daily.      atorvastatin (LIPITOR) 20 MG tablet Take 20 mg by mouth daily.     Cholecalciferol  (VITAMIN D -3) 5000 UNITS TABS Take 5,000 Units by mouth daily.     dapagliflozin propanediol (FARXIGA) 5 MG TABS tablet Take 5 mg by mouth daily.     diazepam  (VALIUM ) 5 MG tablet Take 5 mg by mouth every 6 (six) hours as needed for anxiety or muscle spasms.     Finerenone (KERENDIA PO) Take by mouth daily.     Flaxseed, Linseed, (FLAX SEED OIL) 1000 MG CAPS Take by mouth.     Fluticasone-Umeclidin-Vilant (TRELEGY ELLIPTA IN) Inhale into the lungs daily.     Multiple Vitamin (MULTIVITAMIN) capsule Take 1 capsule by mouth daily.      nitrofurantoin (MACRODANTIN) 50 MG capsule Take 50 mg by mouth daily.     ondansetron  (ZOFRAN ) 4 MG tablet Take 4 mg by mouth every 8 (eight) hours as needed.     pentazocine -naloxone  (TALWIN  NX) 50-0.5 MG per tablet Take 2 tablets by mouth every 12 (twelve) hours.     polyethylene glycol powder (GLYCOLAX /MIRALAX ) 17 GM/SCOOP powder Take 17 g by mouth as needed.     potassium citrate  (UROCIT-K ) 10 MEQ (1080 MG) SR tablet Take 20 mEq by mouth 2 (two) times daily.     REPATHA SURECLICK 140 MG/ML SOAJ 140 mg every 14 (fourteen) days.     Teriparatide 560 MCG/2.24ML SOPN      Vitamin D , Ergocalciferol , (DRISDOL) 1.25 MG (50000 UNIT) CAPS capsule Take 50,000 Units by mouth once a week.     No current facility-administered medications for this visit.   Facility-Administered Medications Ordered in Other Visits  Medication Dose Route Frequency Provider Last Rate Last Admin   propofol  (DIPRIVAN ) 10 mg/mL bolus    PRN Mylo Asberry Pink   60 mg at 01/18/12 1135   succinylcholine  (ANECTINE ) injection    PRN Mylo Asberry Pink   70 mg at 01/18/12 1135     Allergies:   Milk-related compounds; Nsaids; Azithromycin; Egg solids, whole; Egg yolk; and Fish allergy   Social History:  The patient  reports that she has never smoked. She has been exposed to tobacco smoke. She has never used  smokeless tobacco. She reports that she does not drink alcohol and does not use drugs.   Family History:   family history is not on file.    Review of Systems: Review of Systems  Constitutional: Negative.   HENT: Negative.    Respiratory: Negative.    Cardiovascular: Negative.   Gastrointestinal: Negative.   Musculoskeletal: Negative.   Neurological: Negative.   Psychiatric/Behavioral: Negative.    All other systems reviewed and are negative.   PHYSICAL EXAM: VS:  BP 130/66 (BP Location: Right Arm, Patient Position: Sitting, Cuff Size: Normal)   Ht 5' 7 (1.702 m)   Wt 122 lb (55.3 kg)   SpO2 93%   BMI 19.11 kg/m  , BMI Body mass index is 19.11 kg/m. GEN: Well nourished, well developed, in no acute distress HEENT: normal Neck: no JVD, carotid bruits, or masses Cardiac: RRR; no murmurs,  rubs, or gallops,no edema  Respiratory:  clear to auscultation bilaterally, normal work of breathing GI: soft, nontender, nondistended, + BS MS: no deformity or atrophy Skin: warm and dry, no rash Neuro:  Strength and sensation are intact Psych: euthymic mood, full affect    Recent Labs: No results found for requested labs within last 365 days.    Lipid Panel No results found for: CHOL, HDL, LDLCALC, TRIG    Wt Readings from Last 3 Encounters:  08/24/24 122 lb (55.3 kg)  01/23/23 131 lb 6.4 oz (59.6 kg)  01/13/23 131 lb (59.4 kg)       ASSESSMENT AND PLAN:  Problem List Items Addressed This Visit       Cardiology Problems   Atherosclerosis of native arteries of the extremities with ulceration (HCC) - Primary   Relevant Medications   aspirin  EC 81 MG tablet   REPATHA SURECLICK 140 MG/ML SOAJ   atorvastatin (LIPITOR) 20 MG tablet   Other Relevant Orders   EKG 12-Lead   HTN (hypertension)   Relevant Medications   aspirin  EC 81 MG tablet   REPATHA SURECLICK 140 MG/ML SOAJ   atorvastatin (LIPITOR) 20 MG tablet   Other Relevant Orders   EKG 12-Lead     PAD Carotid stenosis, moderate on recent ultrasound through outside office - On Lipitor 20 with Repatha, goal LDL less than 55 - Also on aspirin  with Plavix per primary care  History of acute renal dysfunction Now normalized On Farxiga, given history of chronic UTIs on nitrofurantoin, may not need Farxiga  Left bundle branch block Asymptomatic, denies anginal symptoms Discussed stress testing, we will wait given lack of symptoms Appears left bundle dates back to before March 2025, no prior EKGs available  History of low blood pressure In March 2025 postoperatively in the setting of pain medication, blood loss, weight loss Numbers have now normalized with improved weight   Signed, Velinda Lunger, M.D., Ph.D. [facility] [place], [place] [phone]

## 2024-08-24 ENCOUNTER — Ambulatory Visit: Attending: Cardiovascular Disease | Admitting: Cardiovascular Disease

## 2024-08-24 ENCOUNTER — Encounter: Payer: Self-pay | Admitting: Cardiovascular Disease

## 2024-08-24 VITALS — BP 130/66 | HR 79 | Ht 67.0 in | Wt 122.0 lb

## 2024-08-24 DIAGNOSIS — I7025 Atherosclerosis of native arteries of other extremities with ulceration: Secondary | ICD-10-CM | POA: Diagnosis not present

## 2024-08-24 DIAGNOSIS — I10 Essential (primary) hypertension: Secondary | ICD-10-CM | POA: Diagnosis not present

## 2024-08-24 NOTE — Patient Instructions (Addendum)
